# Patient Record
Sex: Male | Born: 1945 | Race: Black or African American | Hispanic: No | Smoking: Current every day smoker
Health system: Southern US, Community
[De-identification: ages and names within clinical notes are randomized; demographics above are authoritative.]

## PROBLEM LIST (undated history)

## (undated) DIAGNOSIS — F1210 Cannabis abuse, uncomplicated: Secondary | ICD-10-CM

## (undated) DIAGNOSIS — I10 Essential (primary) hypertension: Secondary | ICD-10-CM

## (undated) DIAGNOSIS — Z720 Tobacco use: Secondary | ICD-10-CM

## (undated) HISTORY — PX: OTHER SURGICAL HISTORY: SHX169

---

## 1998-02-01 ENCOUNTER — Emergency Department (HOSPITAL_COMMUNITY): Admission: EM | Admit: 1998-02-01 | Discharge: 1998-02-01 | Payer: Self-pay | Admitting: Emergency Medicine

## 1998-02-03 ENCOUNTER — Ambulatory Visit (HOSPITAL_BASED_OUTPATIENT_CLINIC_OR_DEPARTMENT_OTHER): Admission: RE | Admit: 1998-02-03 | Discharge: 1998-02-03 | Payer: Self-pay | Admitting: *Deleted

## 2007-12-30 ENCOUNTER — Encounter: Admission: RE | Admit: 2007-12-30 | Discharge: 2007-12-30 | Payer: Self-pay | Admitting: Cardiology

## 2008-07-09 DIAGNOSIS — A159 Respiratory tuberculosis unspecified: Secondary | ICD-10-CM

## 2008-07-09 HISTORY — DX: Respiratory tuberculosis unspecified: A15.9

## 2008-09-17 ENCOUNTER — Inpatient Hospital Stay (HOSPITAL_COMMUNITY): Admission: EM | Admit: 2008-09-17 | Discharge: 2008-09-23 | Payer: Self-pay | Admitting: Emergency Medicine

## 2008-09-17 ENCOUNTER — Ambulatory Visit: Payer: Self-pay | Admitting: Internal Medicine

## 2008-11-25 ENCOUNTER — Encounter: Admission: RE | Admit: 2008-11-25 | Discharge: 2008-11-25 | Payer: Self-pay | Admitting: Pulmonary Disease

## 2009-03-29 ENCOUNTER — Encounter: Admission: RE | Admit: 2009-03-29 | Discharge: 2009-03-29 | Payer: Self-pay | Admitting: Infectious Diseases

## 2009-08-15 ENCOUNTER — Ambulatory Visit (HOSPITAL_BASED_OUTPATIENT_CLINIC_OR_DEPARTMENT_OTHER): Admission: RE | Admit: 2009-08-15 | Discharge: 2009-08-15 | Payer: Self-pay | Admitting: Internal Medicine

## 2009-09-04 ENCOUNTER — Ambulatory Visit: Payer: Self-pay | Admitting: Internal Medicine

## 2010-10-19 LAB — DIFFERENTIAL
Basophils Absolute: 0 10*3/uL (ref 0.0–0.1)
Basophils Relative: 0 % (ref 0–1)
Eosinophils Absolute: 0 10*3/uL (ref 0.0–0.7)
Lymphs Abs: 0.6 10*3/uL — ABNORMAL LOW (ref 0.7–4.0)
Monocytes Absolute: 0.9 10*3/uL (ref 0.1–1.0)
Monocytes Relative: 9 % (ref 3–12)
Neutro Abs: 4.6 10*3/uL (ref 1.7–7.7)
Neutro Abs: 4.1 10*3/uL (ref 1.7–7.7)
Neutrophils Relative %: 79 % — ABNORMAL HIGH (ref 43–77)

## 2010-10-19 LAB — URINALYSIS, ROUTINE W REFLEX MICROSCOPIC
Glucose, UA: NEGATIVE mg/dL
Leukocytes, UA: NEGATIVE
pH: 6 (ref 5.0–8.0)

## 2010-10-19 LAB — COMPREHENSIVE METABOLIC PANEL
ALT: 20 U/L (ref 0–53)
BUN: 5 mg/dL — ABNORMAL LOW (ref 6–23)
CO2: 24 mEq/L (ref 19–32)
Calcium: 8.4 mg/dL (ref 8.4–10.5)
GFR calc non Af Amer: >60 mL/min (ref >60)
Glucose, Bld: 99 mg/dL (ref 70–99)
Sodium: 133 mEq/L — ABNORMAL LOW (ref 135–145)

## 2010-10-19 LAB — CBC
HCT: 29.9 % — ABNORMAL LOW (ref 39.0–52.0)
Hemoglobin: 10.6 g/dL — ABNORMAL LOW (ref 13.0–17.0)
Hemoglobin: 11.1 g/dL — ABNORMAL LOW (ref 13.0–17.0)
Hemoglobin: 14.7 g/dL (ref 13.0–17.0)
MCHC: 34 g/dL (ref 30.0–36.0)
MCV: 98.6 fL (ref 78.0–100.0)
Platelets: 239 10*3/uL (ref 150–400)
RBC: 3.04 MIL/uL — ABNORMAL LOW (ref 4.22–5.81)
RBC: 3.45 MIL/uL — ABNORMAL LOW (ref 4.22–5.81)
RBC: 3.11 MIL/uL — ABNORMAL LOW (ref 4.22–5.81)
RBC: 3.26 MIL/uL — ABNORMAL LOW (ref 4.22–5.81)
RDW: 12.6 % (ref 11.5–15.5)
WBC: 5.8 10*3/uL (ref 4.0–10.5)
WBC: 6.9 10*3/uL (ref 4.0–10.5)
WBC: 5.6 10*3/uL (ref 4.0–10.5)

## 2010-10-19 LAB — BASIC METABOLIC PANEL
BUN: 5 mg/dL — ABNORMAL LOW (ref 6–23)
CO2: 25 mEq/L (ref 19–32)
Calcium: 8.7 mg/dL (ref 8.4–10.5)
Calcium: 8.4 mg/dL (ref 8.4–10.5)
Calcium: 9 mg/dL (ref 8.4–10.5)
Chloride: 106 mEq/L (ref 96–112)
Creatinine, Ser: 0.6 mg/dL (ref 0.4–1.5)
Creatinine, Ser: 0.77 mg/dL (ref 0.4–1.5)
GFR calc Af Amer: >60 mL/min (ref >60)
GFR calc Af Amer: >60 mL/min (ref >60)
GFR calc Af Amer: >60 mL/min (ref >60)
GFR calc non Af Amer: >60 mL/min (ref >60)
GFR calc non Af Amer: >60 mL/min (ref >60)
Glucose, Bld: 141 mg/dL — ABNORMAL HIGH (ref 70–99)
Potassium: 4 mEq/L (ref 3.5–5.1)
Potassium: 4.1 mEq/L (ref 3.5–5.1)
Sodium: 135 mEq/L (ref 135–145)
Sodium: 132 mEq/L — ABNORMAL LOW (ref 135–145)
Sodium: 133 mEq/L — ABNORMAL LOW (ref 135–145)

## 2010-10-19 LAB — HEPATITIS PANEL, ACUTE
HCV Ab: REACTIVE — AB
Hep A IgM: NEGATIVE
Hep B C IgM: NEGATIVE

## 2010-10-19 LAB — GRAM STAIN

## 2010-10-19 LAB — VANCOMYCIN, TROUGH: Vancomycin Tr: <5 ug/mL — ABNORMAL LOW (ref 10.0–20.0)

## 2010-10-19 LAB — CULTURE, BLOOD (ROUTINE X 2): Culture: NO GROWTH

## 2010-10-19 LAB — AFB CULTURE WITH SMEAR (NOT AT ARMC)

## 2010-10-19 LAB — EXPECTORATED SPUTUM ASSESSMENT W GRAM STAIN, RFLX TO RESP C

## 2010-10-19 LAB — URINE MICROSCOPIC-ADD ON

## 2010-10-19 LAB — HEPATIC FUNCTION PANEL
AST: 51 U/L — ABNORMAL HIGH (ref 0–37)
Bilirubin, Direct: 0.5 mg/dL — ABNORMAL HIGH (ref 0.0–0.3)
Indirect Bilirubin: 0.8 mg/dL (ref 0.3–0.9)

## 2010-10-19 LAB — CULTURE, RESPIRATORY W GRAM STAIN: Culture: NORMAL

## 2010-10-19 LAB — APTT: aPTT: 30 seconds (ref 24–37)

## 2010-10-19 LAB — IRON AND TIBC
Saturation Ratios: 8 % — ABNORMAL LOW (ref 20–55)
TIBC: 157 ug/dL — ABNORMAL LOW (ref 215–435)
UIBC: 145 ug/dL

## 2010-10-19 LAB — PROTIME-INR
INR: 1 (ref 0.00–1.49)
Prothrombin Time: 13.3 seconds (ref 11.6–15.2)

## 2010-10-19 LAB — FERRITIN: Ferritin: 1378 ng/mL — ABNORMAL HIGH (ref 22–322)

## 2010-10-19 LAB — HCV RNA QUANT: HCV Quantitative: 16600000 IU/mL — ABNORMAL HIGH (ref <43)

## 2010-10-19 LAB — RETICULOCYTES: Retic Count, Absolute: 38.4 10*3/uL (ref 19.0–186.0)

## 2010-10-19 LAB — FOLATE: Folate: >20 ng/mL

## 2010-10-19 LAB — VITAMIN B12: Vitamin B-12: 1026 pg/mL — ABNORMAL HIGH (ref 211–911)

## 2010-11-24 NOTE — Discharge Summary (Signed)
NAME:  FRANCOIS, ELK NO.:  [id number]   MEDICAL RECORD NO.:  [id number]          PATIENT TYPE:  INP   LOCATION:  5509                         FACILITY:  [facility]   PHYSICIAN:  Alvester Morin, M.D.  DATE OF BIRTH:  06-25-1946   DATE OF ADMISSION:  09/17/2008  DATE OF DISCHARGE:  09/23/2008                               DISCHARGE SUMMARY   DISCHARGE DIAGNOSES:  1. Tuberculosis.  2. Community-acquired pneumonia.  3. Polysubstance abuse.  4. Protein-calorie malnutrition.  5. Hepatitis C.  6. Left foot fracture with hardware.   DISCHARGE MEDICATIONS:  1. Isoniazid 300 mg p.o. daily.  2. Pyrazinamide 1500 g p.o. daily.  3. Rifampin 600 mg p.o. daily.  4. Ethambutol 1200 mg p.o. daily.  5. Regular Ensure 1 can p.o. 3 times a day.  6. Vitamin B6 50 mg daily.  7. Avelox 400 mg p.o. for 5 days.   DISPOSITION AND FOLLOWUP:  1. Tuberculosis.  [facility] Department, [place],      phone number [phone], has been notified.  They will follow the      patient discharge.  Per their request, the uric acid level was      checked and was in the normal range and PPD has been placed.  The      patient should be followed according to [facility] and Health Department      Tuberculosis Disease Management and Protocols including DOT (direct      observation therapy) with four-drug TB regimen x9 months or as      determined appropriate by the [facility] Department.  2. Pneumonia.  The patient will finish antibiotic treatment with      Avelox for 5 days.  3. Hepatitis C.  The patient should be evaluated for possible      hepatitis C therapy versus continued observation.   PROCEDURES PERFORMED:  1. Chest x-ray, multifocal nodular consolidation bilaterally, greater      on the right side.  2. CT of chest, 6 cm x 5 cm cavitary vision in the right upper lobe,      suprahilar region, abutting the mediastinum.  3. Nodular air space disease in the right upper  lobe with dense      consolidation in the bilateral upper lobes consistent with      multifocal pneumonia.  4. Attenuation in liver consistent with vascular phenomenon versus      differential fat content in liver.   CONSULTATIONS:  Health Department and Pulmonary Medicine.   HISTORY AND PHYSICAL:  A 65 year old African American male with history  of incarceration x2 days, homelessness/living in shelter, IV drug abuse  and 50-year history of 1 pack per day smoking, complaints of 3-week  history of 20-pound weight loss, fatigue, malaise, anorexia, loss of  appetite, chills, and productive green cough.  The patient decided to  come to the ED after experiencing 1 night of night sweats soaking  through his sheets and blankets.  The patient denies fever, though the  patient has been taking Tylenol PM every night because he has been  unable to  sleep due to his symptoms.  The patient also notes lack of  appetite for smoking cigarettes since these symptoms started.  The  patient was tested for TB when he stayed in the home shelter in the  1990s and per the patient this test was negative.   PHYSICAL EXAMINATION:  VITAL SIGNS:  Blood pressure 123/73, heart rate  93, respirations 18-22, temperature 97.7, and saturation 98% on room  air.   ADMITTING LABS:  CBC:  WBC is 5.7, hemoglobin 14.7, hematocrit 43.3, and  platelet 309.  MCV 98.6, ANC 4.1.  Sodium 132, potassium 3.7, chloride  99, bicarb 25, BUN 7, creatinine 0.77, glucose 141, calcium 9.0, total  bilirubin 1.3, direct bilirubin 0.5, indirect bilirubin 0.8, alk phos  54, AST/ALT is 51 and 21, protein is 7.8, albumin 2.5, PTT is 30, INR is  1.0.  He was hepatitis C antibody positive.  HCV viral load is   Dictation ended at this point.      Rosanna Randy, MD       Alvester Morin, M.D.     CEM/MEDQ  D:  09/28/2008  T:  09/29/2008  Job:  [id number]

## 2010-11-24 NOTE — Discharge Summary (Signed)
NAME:  Sean Farrell, Sean Farrell NO.:  [id number]   MEDICAL RECORD NO.:  [id number]          PATIENT TYPE:  INP   LOCATION:  5509                         FACILITY:  [facility]   PHYSICIAN:  Alvester Morin, M.D.  DATE OF BIRTH:  [birth year]   DATE OF ADMISSION:  09/17/2008  DATE OF DISCHARGE:  09/23/2008                               DISCHARGE SUMMARY   DISCHARGE DIAGNOSES:  1. Tuberculosis.  2. Community-acquired pneumonia.  3. Polysubstance abuse.  4. Protein-calorie malnutrition.  5. Hepatitis C.  6. Left foot fracture with hardware.   DISCHARGE MEDICATIONS:  1. Isoniazid 300 mg p.o. daily.  2. Pyrazinamide 1500 g p.o. daily.  3. Rifampin 600 mg p.o. daily.  4. Ethambutol 1200 mg p.o. daily.  5. Regular Ensure 1 can p.o. 3 times daily.  6. Vitamin B6 50 mg p.o. daily.  7. Avelox 400 mg p.o. x5 days.   DISPOSITION FOLLOWUP:  1. Tuberculosis.  [facility] Department, [place],      phone number [phone], has been notified.  They were following      the patient at discharge.  Per their request, uric acid level was      checked and is in the normal range.  PPD has been placed.  The      patient should be followed according to [facility] and Health Department      Tuberculosis Disease Management protocols including DOT, direct      observation therapy, with four-drug TB regimen x9 months or as      determined appropriate by the [facility] Department.  2. Pneumonia.  The patient will finish his 5-day course of Avelox      antibiotics.  3. Hepatitis C.  The patient should be evaluated for possible      hepatitis C therapy versus continued observation.  He is not being      treated at this time.   PROCEDURES PERFORMED:  1. Chest x-ray, multifocal nodular consolidation bilaterally, greater      on the right side.  2. CT of the chest, 6 cm x 5 cm cavitary lesion in the right upper      lobe suprahilar region abutting the mediastinum.  Nodular air  space      disease in the right upper lobe with dense consolidation in the      bilateral upper lobes consistent with multifocal pneumonia.  3. Attenuation in liver consistent with vascular phenomenon versus      differential fat-containing liver   CONSULTATIONS:  Health Department and Pulmonary Medicine.   HISTORY AND PHYSICAL:  A 66 year old African American male with history  of incarceration x2 days, homelessness/living in the shelter, IV drug  abuse and 50-year 1 pack per day smoking complaints, 3-week history of  20-pound weight loss, fatigue, malaise, anorexia, loss of appetite,  chills, and productive green cough.  The patient decided to come to the  ED after experiencing 1 night of night sweats soaking through his sheets  and blankets.  The patient denies fever.  The patient has been Tylenol  PM every night because he  has been unable to sleep due to his symptoms.  The patient also lacks appetite for smoking cigarettes since these  symptoms have started.  The patient was tested for TB when he stayed in  the homeless shelter in 1990s.  Per the patient, this test was negative.   PHYSICAL EXAMINATION:  BP 122/73, heart rate 93, respirations 18-22,  temperature 97.7, and saturations 98% on room air.   ADMITTING LABORATORY DATA:  WBC 5.7, hemoglobin and hematocrit 14.7 and  43.3, platelets 309, MCV 98.6, ANC 4.1.  Sodium 132, potassium 3.7,  chloride 99, bicarb 25, BUN 7, creatinine 0.77, glucose 141, calcium  9.0, bili 1.3, direct 0.5, indirect 0.8, alk phos 54, AST 51, ALT 21,  protein 7.8, albumin 2.5, PTT 30, INR 1.0.  HCV antibody positive.  HCV  RNA quantitation is 16,600,000 IU/mL.  HCV RNA QNP is 7.22 log IU/mL.  HIV negative.  AFB 4+.  Blood culture negative.  Urine culture negative.  Urinalysis negative and the blood culture was negative x2.   HOSPITAL COURSE:  1. Tuberculosis.  The patient was initially admitted for pneumonia      versus tuberculosis versus lung  cancer.  The patient was placed on      airborne precautions with negative pressure isolation, pending      results of AFB smear.  The patient was started on four-drug therapy      with INH, rifampin, pyrazinamide, ethambutol, and B6 supplement      when AFB returned 4+.  Pulmonary was consulted on hospital day #2      for bronchoscopy.  The bronchoscopy was deferred given AFB 4+ which      is TB until proven otherwise per Pulmonary's note.  The Health      Department was notified and they followed the patient through the      hospitalization.  The patient was discharged on September 23, 2008,      after being appropriately educated as to the severity of his      illness and the importance of compliance with four-drug therapy for      9 months.  The Health Department was consulted prior to discharge      for treatment and inpatient length of stay guidelines (there is no      specific protocol).  The patient was discharged to the supervision      of the Health Department, [facility], [place], [phone].  He      will follow as per Health Department TB disease management      protocol/direct observation therapy guidelines.  2. Pneumonia.  The patient was placed on broad-spectrum antibiotics,      vancomycin, azithromycin, ceftriaxone to cover typical and atypical      organisms on hospital day #3.  These were discontinued and replaced      by Avelox only.  3. Malnutrition.  The patient was determined to have severe protein-      calorie malnutrition with recommendations for Ensure (regular      shakes) 3 times daily which the patient has tolerated through his      hospitalization.  4. Polysubstance abuse.  Social Work was consulted on the hospital day      #1.  The patient expressed his willingness to discontinue the use      of illicit substances including marijuana, cocaine, tobacco, and      alcohol.  The patient did not display signs or symptoms of alcohol  withdrawal, CIWA score was  3 and it is always less than 7.   DISCHARGE VITAL SIGNS:  On September 23, 2008, at 12:40 p.m., temperature  98.0, pulse 96, respirations 18, blood pressure was 97/66, and O2 sat  97% on 2 L nasal cannula.   DISCHARGE LABORATORY DATA:  White count 5.8, hemoglobin 11.5, hematocrit  34.3, MCV 99.2, MCHC 33.7, RDW 13.0, platelets 264, neutrophils 79%,  lymphocyte 10%, monocyte 9%, eosinophils 1%, basophils 1%, and ANC 4.6.  Chemistries were as follows:  Sodium 138, potassium 3.5, chloride 106,  bicarb 24, glucose 130, BUN 3, creatinine 0.60, and calcium 8.7.      Rosanna Randy, MD  Electronically Signed      Alvester Morin, M.D.  Electronically Signed    CEM/MEDQ  D:  09/28/2008  T:  09/29/2008  Job:  [id number]   cc:   Shirley Friar

## 2011-03-06 ENCOUNTER — Emergency Department (HOSPITAL_COMMUNITY): Payer: No Typology Code available for payment source

## 2011-03-06 ENCOUNTER — Inpatient Hospital Stay (HOSPITAL_COMMUNITY)
Admission: EM | Admit: 2011-03-06 | Discharge: 2011-03-09 | DRG: 065 | Disposition: A | Discharge status: Home / Self Care | Payer: No Typology Code available for payment source | Attending: Internal Medicine | Admitting: Internal Medicine

## 2011-03-06 DIAGNOSIS — Z8611 Personal history of tuberculosis: Secondary | ICD-10-CM

## 2011-03-06 DIAGNOSIS — B1920 Unspecified viral hepatitis C without hepatic coma: Secondary | ICD-10-CM | POA: Diagnosis present

## 2011-03-06 DIAGNOSIS — G8929 Other chronic pain: Secondary | ICD-10-CM | POA: Diagnosis present

## 2011-03-06 DIAGNOSIS — F1410 Cocaine abuse, uncomplicated: Secondary | ICD-10-CM | POA: Diagnosis present

## 2011-03-06 DIAGNOSIS — I10 Essential (primary) hypertension: Secondary | ICD-10-CM | POA: Diagnosis present

## 2011-03-06 DIAGNOSIS — F1110 Opioid abuse, uncomplicated: Secondary | ICD-10-CM | POA: Diagnosis present

## 2011-03-06 DIAGNOSIS — Z79899 Other long term (current) drug therapy: Secondary | ICD-10-CM

## 2011-03-06 DIAGNOSIS — M47817 Spondylosis without myelopathy or radiculopathy, lumbosacral region: Secondary | ICD-10-CM | POA: Diagnosis present

## 2011-03-06 DIAGNOSIS — F1210 Cannabis abuse, uncomplicated: Secondary | ICD-10-CM | POA: Diagnosis present

## 2011-03-06 DIAGNOSIS — N390 Urinary tract infection, site not specified: Secondary | ICD-10-CM | POA: Diagnosis present

## 2011-03-06 DIAGNOSIS — I498 Other specified cardiac arrhythmias: Secondary | ICD-10-CM | POA: Diagnosis not present

## 2011-03-06 DIAGNOSIS — I6350 Cerebral infarction due to unspecified occlusion or stenosis of unspecified cerebral artery: Principal | ICD-10-CM | POA: Diagnosis present

## 2011-03-06 DIAGNOSIS — F17200 Nicotine dependence, unspecified, uncomplicated: Secondary | ICD-10-CM | POA: Diagnosis present

## 2011-03-06 DIAGNOSIS — Z7982 Long term (current) use of aspirin: Secondary | ICD-10-CM

## 2011-03-06 LAB — CBC
HCT: 45.3 % (ref 39.0–52.0)
MCV: 89.9 fL (ref 78.0–100.0)
RDW: 12.1 % (ref 11.5–15.5)
WBC: 6.1 10*3/uL (ref 4.0–10.5)

## 2011-03-06 LAB — RAPID URINE DRUG SCREEN, HOSP PERFORMED
Amphetamines: NOT DETECTED
Barbiturates: NOT DETECTED
Benzodiazepines: POSITIVE — AB
Cocaine: POSITIVE — AB

## 2011-03-06 LAB — DIFFERENTIAL
Eosinophils Relative: 1 % (ref 0–5)
Lymphocytes Relative: 37 % (ref 12–46)
Lymphs Abs: 2.3 10*3/uL (ref 0.7–4.0)
Monocytes Absolute: 0.8 10*3/uL (ref 0.1–1.0)

## 2011-03-06 LAB — BASIC METABOLIC PANEL
BUN: 12 mg/dL (ref 6–23)
Creatinine, Ser: 1.05 mg/dL (ref 0.50–1.35)
GFR calc Af Amer: >60 mL/min (ref >60)
GFR calc non Af Amer: >60 mL/min (ref >60)
Glucose, Bld: 110 mg/dL — ABNORMAL HIGH (ref 70–99)

## 2011-03-06 LAB — POCT I-STAT TROPONIN I: Troponin i, poc: 0 ng/mL (ref 0.00–0.08)

## 2011-03-06 LAB — CK TOTAL AND CKMB (NOT AT ARMC)
CK, MB: 1.5 ng/mL (ref 0.3–4.0)
Relative Index: INVALID (ref 0.0–2.5)
Total CK: 34 U/L (ref 7–232)

## 2011-03-06 LAB — PROTIME-INR
INR: 0.91 (ref 0.00–1.49)
Prothrombin Time: 12.4 seconds (ref 11.6–15.2)

## 2011-03-06 LAB — APTT: aPTT: 30 seconds (ref 24–37)

## 2011-03-07 DIAGNOSIS — I517 Cardiomegaly: Secondary | ICD-10-CM

## 2011-03-07 LAB — CARDIAC PANEL(CRET KIN+CKTOT+MB+TROPI)
CK, MB: 1.5 ng/mL (ref 0.3–4.0)
CK, MB: 1.6 ng/mL (ref 0.3–4.0)
Relative Index: INVALID (ref 0.0–2.5)
Troponin I: <0.3 ng/mL (ref <0.30)
Troponin I: <0.3 ng/mL (ref <0.30)

## 2011-03-07 LAB — CBC
Hemoglobin: 13.2 g/dL (ref 13.0–17.0)
MCH: 30.9 pg (ref 26.0–34.0)
MCHC: 34.6 g/dL (ref 30.0–36.0)
MCV: 89.5 fL (ref 78.0–100.0)
RBC: 4.27 MIL/uL (ref 4.22–5.81)

## 2011-03-07 LAB — GLUCOSE, CAPILLARY
Glucose-Capillary: 85 mg/dL (ref 70–99)
Glucose-Capillary: 113 mg/dL — ABNORMAL HIGH (ref 70–99)

## 2011-03-07 LAB — COMPREHENSIVE METABOLIC PANEL
Alkaline Phosphatase: 81 U/L (ref 39–117)
BUN: 11 mg/dL (ref 6–23)
Chloride: 104 mEq/L (ref 96–112)
Creatinine, Ser: 0.76 mg/dL (ref 0.50–1.35)
GFR calc Af Amer: >60 mL/min (ref >60)
GFR calc non Af Amer: >60 mL/min (ref >60)
Glucose, Bld: 95 mg/dL (ref 70–99)
Potassium: 3.6 mEq/L (ref 3.5–5.1)
Total Bilirubin: 0.7 mg/dL (ref 0.3–1.2)

## 2011-03-07 LAB — URINALYSIS, MICROSCOPIC ONLY
Bilirubin Urine: NEGATIVE
Ketones, ur: NEGATIVE mg/dL
Nitrite: POSITIVE — AB
Protein, ur: NEGATIVE mg/dL
pH: 6 (ref 5.0–8.0)

## 2011-03-07 LAB — LIPID PANEL
Cholesterol: 105 mg/dL (ref 0–200)
Triglycerides: 79 mg/dL (ref <150)
VLDL: 16 mg/dL (ref 0–40)

## 2011-03-08 LAB — GLUCOSE, CAPILLARY: Glucose-Capillary: 128 mg/dL — ABNORMAL HIGH (ref 70–99)

## 2011-03-08 LAB — BASIC METABOLIC PANEL
BUN: 8 mg/dL (ref 6–23)
CO2: 27 mEq/L (ref 19–32)
Chloride: 103 mEq/L (ref 96–112)
Creatinine, Ser: 0.64 mg/dL (ref 0.50–1.35)
GFR calc Af Amer: >60 mL/min (ref >60)

## 2011-03-09 LAB — GLUCOSE, CAPILLARY: Glucose-Capillary: 105 mg/dL — ABNORMAL HIGH (ref 70–99)

## 2011-03-24 NOTE — H&P (Signed)
NAME:  Sean Farrell, Sean Farrell NO.:  [id number]  MEDICAL RECORD NO.:  [id number]  LOCATION:  [facility]                         FACILITY:  [facility]  PHYSICIAN:  Kela Millin, M.D.DATE OF BIRTH:  [birth year]  DATE OF ADMISSION:  03/06/2011 DATE OF DISCHARGE:                             HISTORY & PHYSICAL   PRIMARY. CARE PHYSICIAN:  Fleet Contras, MD.  CHIEF COMPLAINT:  Presyncope, status post fall.  HISTORY OF PRESENT ILLNESS:  The patient is a 65 year old black male with history of polysubstance abuse, protein-calorie malnutrition, hepatitis C, chronic low back pain and tuberculosis -- status post treatment in 2010 who presents with the above complaints.  He states that he was in his usual state of health until yesterday when he felt lightheaded and then fell down.  He states that he did not loose consciousness because he remembers hearing all the noises as he was falling down and really does not think that he was unconscious at any time.  He denies chest pain, focal weakness, slurred speech, blurry vision, diarrhea, melena, nausea, vomiting and no hematochezia.  He also denies dysphagia, dysuria, cough and no shortness of breath.  He was seen at his primary care physician's office and an EKG was done and noted to be abnormal per PCP with premature atrial contractions and question of ST changes in the lateral leads and he was sent to the ED for further evaluation.  Patient states that he has had similar episodes of presyncope in the past, but was never worked up.  He was seen in the ED and an EKG was done that showed sinus tachycardia with premature atrial complexes, but no acute ST changes, a CT scan of his brain was done and showed left basilar ganglia lacunar infarct noted to have developed since his previous CT scan exam, he is admitted for further evaluation and management.  PAST MEDICAL HISTORY: 1. As above. 2. History of left foot fracture with  hardware.  MEDICATIONS:  Ibuprofen 800 mg t.i.d. p.r.n., Flexeril 10 mg p.o. daily p.r.n.  ALLERGIES:  NKDA.  SOCIAL HISTORY:  Positive for cocaine, initially just indicated that he was around people that used it on Sunday and with further questioning admits to using cocaine this past Sunday.  He states that his last marijuana use was a couple of weeks ago.  He admits to tobacco use, denies alcohol.  FAMILY HISTORY:  He states that his uncles had colon and prostate cancer, denies any family history of strokes and MIs.  REVIEW OF SYSTEMS:  As per HPI, other review of systems negative.  PHYSICAL EXAMINATION:  GENERAL:  The patient is an older black male, he is alert and oriented, in no apparent distress. VITAL SIGNS:  His blood pressure is 136/79, initially 155/131, his pulse is 57 initially 110, and his O2 sat 97%. HEENT:  PERRL, EOMI.  No facial asymmetry, slightly dry mucous membranes. NECK:  Supple, no adenopathy, no thyromegaly, no carotid bruits appreciated. LUNGS:  Clear to auscultation bilaterally.  No crackles or wheezes. CARDIOVASCULAR:  Regular rate and rhythm.  Normal S1 and S2. ABDOMEN:  Soft, bowel sounds present, nontender, nondistended.  No organomegaly and no masses palpable. EXTREMITIES:  No cyanosis and no edema. NEURO:  Alert and oriented x3.  Cranial nerves II through XII are grossly intact.  His strength is 5/5 and symmetric, nonfocal exam.  LABORATORY DATA:  CT scan of brain -- left basilar ganglia lacunar infarct developed since his previous exam, but no acute findings.  His white cell count is 6.1 with a hemoglobin of 16.3, hematocrit of 45.3, platelet count of 195, neutrophil count 48%, alcohol level is less than 11.  Sodium is 138, potassium of 4.3, chloride 101, CO2 of 27, glucose is 110, BUN is 12, creatinine is 1.05, calcium is 10.1.  His point-of-care markers negative x2 and his urine drug screen is positive for cocaine, opiates and marijuana.   His chest x-ray shows chronic lung changes/fibrosis, no acute pulmonary findings.  ASSESSMENT AND PLAN: 1. Presyncope -- status post fall -- as discussed above, the patient     denies losing any consciousness that his CT scan shows basal     ganglia lacunar infarct since his previous exam, although no acute     findings reported.  His EKG here shows sinus tachycardia with     biatrial enlargement and premature atrial complexes but no acute ST     wave changes.  Obtain an MRI and workup for stroke to further     evaluate the CT findings.  We will place patient on aspirin, follow     MRI and consult neurologist, pending the MRI report.  We will also     cycle co-cardiac enzymes, obtain a 2-D echocardiogram and place the     patient on p.r.n. nitroglycerin follow and consider Cardiology     consultation, pending is cardiac enzymes/echo findings. 2. Polysubstance abuse -- patient counseled to quit.  Social work     consult for community resources to quit. 3. History of chronic low back pain -- follow and manage pain as     appropriate. 4. History of hepatitis C -- obtain a.c. meds and follow. 5. History of tuberculosis -- status post treatment in 2010.     Kela Millin, M.D.     ACV/MEDQ  D:  03/06/2011  T:  03/06/2011  Job:  [id number]  cc:   Fleet Contras, M.D.  Electronically Signed by Donnalee Curry M.D. on 03/24/2011 05:42:58 PM

## 2011-04-19 NOTE — Discharge Summary (Signed)
  NAME:  ROWE, WARMAN NO.:  [id number]  MEDICAL RECORD NO.:  [id number]  LOCATION:  3742                         FACILITY:  [facility]  PHYSICIAN:  Zannie Cove, MD     DATE OF BIRTH:  [birth year]  DATE OF ADMISSION:  03/06/2011 DATE OF DISCHARGE:                              DISCHARGE SUMMARY   ADDENDUM  PRIMARY CARE PHYSICIAN:  Fleet Contras, MD at [facility].  Discharge diagnosis as dictated yesterday in addition:  The patient had an episode of SVT yesterday morning.  Subsequently, he was started on 25 mg of atenolol.  He was symptomatic with palpitations at that time.  He was monitored on telemetry for over 24 hours.  He did not have any further events.  Also, had an unremarkable echocardiogram as dictated previously, hence the patient is being discharged home today with atenolol added to his medicine list and SVT to his problem list, to follow up with Dr. Concepcion Elk in 1 week.     Zannie Cove, MD   PJ/MEDQ  D:  03/09/2011  T:  03/09/2011  Job:  [id number]  cc:   Fleet Contras, M.D.  Electronically Signed by Zannie Cove  on 04/19/2011 02:10:58 PM

## 2011-04-19 NOTE — Discharge Summary (Signed)
NAME:  Sean Farrell, Sean Farrell NO.:  [id number]  MEDICAL RECORD NO.:  [id number]  LOCATION:  3742                         FACILITY:  [facility]  PHYSICIAN:  Zannie Cove, MD     DATE OF BIRTH:  December 29, 1945  DATE OF ADMISSION:  03/06/2011 DATE OF DISCHARGE:                              DISCHARGE SUMMARY   PRIMARY CARE PHYSICIAN:  Fleet Contras, MD at [facility].  DISCHARGE DIAGNOSES: 1. Right medial temporal infarct/cerebrovascular accident. 2. Hypertension. 3. Urinary tract infection. 4. Cocaine abuse. 5. Tobacco use. 6. Other polysubstances, i.e., cannabis and narcotic abuse as well. 7. History of chronic head pain. 8. Chronic low back pain. 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  DISCHARGE MEDICATIONS:  As follows: 1. Amlodipine 10 mg daily. 2. Aspirin 325 mg daily. 3. Ciprofloxacin 250 mg p.o. b.i.d. for 5 days. 4. Hydrochlorothiazide 25 mg daily. 5. Flexeril 10 mg p.o. daily as needed for pain.  DIAGNOSTICS/INVESTIGATIONS: 1. X-ray lumbar spine, moderate degenerative lumbar spondylosis, no     acute bony abnormalities. 2. Chest x-ray, chronic lung changes/fibrosis, no acute pulmonary     findings. 3. CT of the head shows atrophy, nonspecific white matter changes,     left basal ganglia lacunar infarct. 4. MRI of the brain showed acute non-hemorrhagic medial right temporal     infarct. 5. MRA of the head showed intracranial atherosclerotic changes without     high-grade stenosis.  HOSPITAL COURSE: 1. [name] is a 65 year old gentleman with history of known     polysubstance abuse who presented to the hospital with presyncope.     Please see the note dictated by Dr. Suanne Marker for more details.  On     admission, he was ordered to have an MRI and MRA of brain and had     cardiac enzymes and echo, etc., ordered.  His MRI came back with a     small right medial temporal infarct, which is felt to be a small     vessel cortical  infarct.  The patient was started on full-strength     aspirin.  His echo was found to be unremarkable.  Did not have any     physical, occupational, or speech therapy needs.  The patient is     also advised to stop cocaine and smoking. 2. Urinary tract infection.  This was sent out yesterday due to     urinary frequency.  UA suggestive of UTI.  However, cultures are     pending at this time.  The patient is being discharged home on     ciprofloxacin for 5 days and is advised to follow up with Urology     since this is a 65 year old male with UTI. 3. Polysubstance abuse.  He was counseled regarding this.  He also was     discussed with social work on a number of occasions.  DISCHARGE CONDITION:  Stable.  VITAL SIGNS:  Temperature 98, pulse 75, blood pressure 170/94, respirations 14, saturating 98% on room air.  DISCHARGE FOLLOWUP: 1. Dr. Fleet Contras in 1 week. 2. Urology in 1 month.  The patient needs a referral through his PCP's office  for this.     Zannie Cove, MD     PJ/MEDQ  D:  03/08/2011  T:  03/08/2011  Job:  [id number]  cc:   Fleet Contras, M.D.  Electronically Signed by Zannie Cove  on 04/19/2011 02:10:52 PM

## 2014-02-23 ENCOUNTER — Other Ambulatory Visit (HOSPITAL_COMMUNITY): Payer: Self-pay | Admitting: Internal Medicine

## 2014-02-23 DIAGNOSIS — I739 Peripheral vascular disease, unspecified: Secondary | ICD-10-CM

## 2014-02-23 DIAGNOSIS — I110 Hypertensive heart disease with heart failure: Secondary | ICD-10-CM

## 2014-02-23 DIAGNOSIS — I499 Cardiac arrhythmia, unspecified: Secondary | ICD-10-CM

## 2014-02-25 ENCOUNTER — Ambulatory Visit (HOSPITAL_COMMUNITY): Payer: PRIVATE HEALTH INSURANCE | Attending: Internal Medicine

## 2014-02-25 ENCOUNTER — Ambulatory Visit (HOSPITAL_COMMUNITY): Admission: RE | Admit: 2014-02-25 | Payer: PRIVATE HEALTH INSURANCE | Source: Ambulatory Visit

## 2014-07-16 DIAGNOSIS — G4700 Insomnia, unspecified: Secondary | ICD-10-CM | POA: Diagnosis not present

## 2014-07-16 DIAGNOSIS — I10 Essential (primary) hypertension: Secondary | ICD-10-CM | POA: Diagnosis not present

## 2014-07-16 DIAGNOSIS — K219 Gastro-esophageal reflux disease without esophagitis: Secondary | ICD-10-CM | POA: Diagnosis not present

## 2014-07-16 DIAGNOSIS — M5136 Other intervertebral disc degeneration, lumbar region: Secondary | ICD-10-CM | POA: Diagnosis not present

## 2014-07-16 DIAGNOSIS — J449 Chronic obstructive pulmonary disease, unspecified: Secondary | ICD-10-CM | POA: Diagnosis not present

## 2014-08-28 DIAGNOSIS — M150 Primary generalized (osteo)arthritis: Secondary | ICD-10-CM | POA: Diagnosis not present

## 2014-08-29 DIAGNOSIS — M150 Primary generalized (osteo)arthritis: Secondary | ICD-10-CM | POA: Diagnosis not present

## 2014-08-30 DIAGNOSIS — M150 Primary generalized (osteo)arthritis: Secondary | ICD-10-CM | POA: Diagnosis not present

## 2014-08-31 DIAGNOSIS — M150 Primary generalized (osteo)arthritis: Secondary | ICD-10-CM | POA: Diagnosis not present

## 2014-09-01 DIAGNOSIS — M150 Primary generalized (osteo)arthritis: Secondary | ICD-10-CM | POA: Diagnosis not present

## 2014-09-02 DIAGNOSIS — M150 Primary generalized (osteo)arthritis: Secondary | ICD-10-CM | POA: Diagnosis not present

## 2014-09-03 DIAGNOSIS — M150 Primary generalized (osteo)arthritis: Secondary | ICD-10-CM | POA: Diagnosis not present

## 2014-09-04 DIAGNOSIS — M150 Primary generalized (osteo)arthritis: Secondary | ICD-10-CM | POA: Diagnosis not present

## 2014-09-05 DIAGNOSIS — M150 Primary generalized (osteo)arthritis: Secondary | ICD-10-CM | POA: Diagnosis not present

## 2014-09-07 DIAGNOSIS — M150 Primary generalized (osteo)arthritis: Secondary | ICD-10-CM | POA: Diagnosis not present

## 2014-09-08 DIAGNOSIS — M150 Primary generalized (osteo)arthritis: Secondary | ICD-10-CM | POA: Diagnosis not present

## 2014-09-09 DIAGNOSIS — M150 Primary generalized (osteo)arthritis: Secondary | ICD-10-CM | POA: Diagnosis not present

## 2014-09-10 DIAGNOSIS — M150 Primary generalized (osteo)arthritis: Secondary | ICD-10-CM | POA: Diagnosis not present

## 2014-09-11 DIAGNOSIS — M150 Primary generalized (osteo)arthritis: Secondary | ICD-10-CM | POA: Diagnosis not present

## 2014-09-12 DIAGNOSIS — M150 Primary generalized (osteo)arthritis: Secondary | ICD-10-CM | POA: Diagnosis not present

## 2014-09-13 DIAGNOSIS — M150 Primary generalized (osteo)arthritis: Secondary | ICD-10-CM | POA: Diagnosis not present

## 2014-09-14 DIAGNOSIS — M150 Primary generalized (osteo)arthritis: Secondary | ICD-10-CM | POA: Diagnosis not present

## 2014-09-15 DIAGNOSIS — M150 Primary generalized (osteo)arthritis: Secondary | ICD-10-CM | POA: Diagnosis not present

## 2014-09-16 DIAGNOSIS — M150 Primary generalized (osteo)arthritis: Secondary | ICD-10-CM | POA: Diagnosis not present

## 2014-09-17 DIAGNOSIS — M150 Primary generalized (osteo)arthritis: Secondary | ICD-10-CM | POA: Diagnosis not present

## 2014-09-18 DIAGNOSIS — M150 Primary generalized (osteo)arthritis: Secondary | ICD-10-CM | POA: Diagnosis not present

## 2014-09-19 DIAGNOSIS — M150 Primary generalized (osteo)arthritis: Secondary | ICD-10-CM | POA: Diagnosis not present

## 2014-09-20 DIAGNOSIS — M150 Primary generalized (osteo)arthritis: Secondary | ICD-10-CM | POA: Diagnosis not present

## 2014-09-22 DIAGNOSIS — M150 Primary generalized (osteo)arthritis: Secondary | ICD-10-CM | POA: Diagnosis not present

## 2014-09-23 DIAGNOSIS — M150 Primary generalized (osteo)arthritis: Secondary | ICD-10-CM | POA: Diagnosis not present

## 2014-09-24 DIAGNOSIS — M150 Primary generalized (osteo)arthritis: Secondary | ICD-10-CM | POA: Diagnosis not present

## 2014-09-25 DIAGNOSIS — M150 Primary generalized (osteo)arthritis: Secondary | ICD-10-CM | POA: Diagnosis not present

## 2014-09-26 DIAGNOSIS — M150 Primary generalized (osteo)arthritis: Secondary | ICD-10-CM | POA: Diagnosis not present

## 2014-09-27 DIAGNOSIS — M150 Primary generalized (osteo)arthritis: Secondary | ICD-10-CM | POA: Diagnosis not present

## 2014-09-28 DIAGNOSIS — M150 Primary generalized (osteo)arthritis: Secondary | ICD-10-CM | POA: Diagnosis not present

## 2014-09-29 DIAGNOSIS — M150 Primary generalized (osteo)arthritis: Secondary | ICD-10-CM | POA: Diagnosis not present

## 2014-09-30 DIAGNOSIS — M150 Primary generalized (osteo)arthritis: Secondary | ICD-10-CM | POA: Diagnosis not present

## 2014-10-01 DIAGNOSIS — M150 Primary generalized (osteo)arthritis: Secondary | ICD-10-CM | POA: Diagnosis not present

## 2014-10-02 DIAGNOSIS — M150 Primary generalized (osteo)arthritis: Secondary | ICD-10-CM | POA: Diagnosis not present

## 2014-10-03 DIAGNOSIS — M150 Primary generalized (osteo)arthritis: Secondary | ICD-10-CM | POA: Diagnosis not present

## 2014-10-04 DIAGNOSIS — M150 Primary generalized (osteo)arthritis: Secondary | ICD-10-CM | POA: Diagnosis not present

## 2014-10-05 DIAGNOSIS — M150 Primary generalized (osteo)arthritis: Secondary | ICD-10-CM | POA: Diagnosis not present

## 2014-10-09 DIAGNOSIS — M150 Primary generalized (osteo)arthritis: Secondary | ICD-10-CM | POA: Diagnosis not present

## 2014-10-10 DIAGNOSIS — M150 Primary generalized (osteo)arthritis: Secondary | ICD-10-CM | POA: Diagnosis not present

## 2014-10-11 DIAGNOSIS — M150 Primary generalized (osteo)arthritis: Secondary | ICD-10-CM | POA: Diagnosis not present

## 2014-10-12 DIAGNOSIS — M150 Primary generalized (osteo)arthritis: Secondary | ICD-10-CM | POA: Diagnosis not present

## 2014-10-13 DIAGNOSIS — M150 Primary generalized (osteo)arthritis: Secondary | ICD-10-CM | POA: Diagnosis not present

## 2014-10-14 DIAGNOSIS — M150 Primary generalized (osteo)arthritis: Secondary | ICD-10-CM | POA: Diagnosis not present

## 2014-10-15 DIAGNOSIS — M150 Primary generalized (osteo)arthritis: Secondary | ICD-10-CM | POA: Diagnosis not present

## 2014-10-16 DIAGNOSIS — M150 Primary generalized (osteo)arthritis: Secondary | ICD-10-CM | POA: Diagnosis not present

## 2014-10-17 DIAGNOSIS — M150 Primary generalized (osteo)arthritis: Secondary | ICD-10-CM | POA: Diagnosis not present

## 2014-10-18 DIAGNOSIS — M150 Primary generalized (osteo)arthritis: Secondary | ICD-10-CM | POA: Diagnosis not present

## 2014-10-19 DIAGNOSIS — M150 Primary generalized (osteo)arthritis: Secondary | ICD-10-CM | POA: Diagnosis not present

## 2014-10-20 DIAGNOSIS — M150 Primary generalized (osteo)arthritis: Secondary | ICD-10-CM | POA: Diagnosis not present

## 2014-10-21 DIAGNOSIS — M150 Primary generalized (osteo)arthritis: Secondary | ICD-10-CM | POA: Diagnosis not present

## 2014-10-22 DIAGNOSIS — M150 Primary generalized (osteo)arthritis: Secondary | ICD-10-CM | POA: Diagnosis not present

## 2014-10-23 DIAGNOSIS — M150 Primary generalized (osteo)arthritis: Secondary | ICD-10-CM | POA: Diagnosis not present

## 2014-10-24 DIAGNOSIS — M150 Primary generalized (osteo)arthritis: Secondary | ICD-10-CM | POA: Diagnosis not present

## 2014-10-25 DIAGNOSIS — M150 Primary generalized (osteo)arthritis: Secondary | ICD-10-CM | POA: Diagnosis not present

## 2014-10-26 DIAGNOSIS — M150 Primary generalized (osteo)arthritis: Secondary | ICD-10-CM | POA: Diagnosis not present

## 2014-10-27 DIAGNOSIS — M150 Primary generalized (osteo)arthritis: Secondary | ICD-10-CM | POA: Diagnosis not present

## 2014-10-28 DIAGNOSIS — M150 Primary generalized (osteo)arthritis: Secondary | ICD-10-CM | POA: Diagnosis not present

## 2014-10-29 DIAGNOSIS — M150 Primary generalized (osteo)arthritis: Secondary | ICD-10-CM | POA: Diagnosis not present

## 2014-10-30 DIAGNOSIS — M150 Primary generalized (osteo)arthritis: Secondary | ICD-10-CM | POA: Diagnosis not present

## 2014-10-31 DIAGNOSIS — M150 Primary generalized (osteo)arthritis: Secondary | ICD-10-CM | POA: Diagnosis not present

## 2014-11-01 DIAGNOSIS — M150 Primary generalized (osteo)arthritis: Secondary | ICD-10-CM | POA: Diagnosis not present

## 2014-11-02 DIAGNOSIS — M150 Primary generalized (osteo)arthritis: Secondary | ICD-10-CM | POA: Diagnosis not present

## 2014-11-03 DIAGNOSIS — M150 Primary generalized (osteo)arthritis: Secondary | ICD-10-CM | POA: Diagnosis not present

## 2014-11-04 DIAGNOSIS — M150 Primary generalized (osteo)arthritis: Secondary | ICD-10-CM | POA: Diagnosis not present

## 2014-11-05 DIAGNOSIS — M150 Primary generalized (osteo)arthritis: Secondary | ICD-10-CM | POA: Diagnosis not present

## 2014-11-06 DIAGNOSIS — M150 Primary generalized (osteo)arthritis: Secondary | ICD-10-CM | POA: Diagnosis not present

## 2014-11-07 DIAGNOSIS — M150 Primary generalized (osteo)arthritis: Secondary | ICD-10-CM | POA: Diagnosis not present

## 2014-11-08 DIAGNOSIS — M150 Primary generalized (osteo)arthritis: Secondary | ICD-10-CM | POA: Diagnosis not present

## 2014-11-09 DIAGNOSIS — M150 Primary generalized (osteo)arthritis: Secondary | ICD-10-CM | POA: Diagnosis not present

## 2014-11-10 DIAGNOSIS — M150 Primary generalized (osteo)arthritis: Secondary | ICD-10-CM | POA: Diagnosis not present

## 2014-11-11 DIAGNOSIS — M150 Primary generalized (osteo)arthritis: Secondary | ICD-10-CM | POA: Diagnosis not present

## 2014-11-12 DIAGNOSIS — M150 Primary generalized (osteo)arthritis: Secondary | ICD-10-CM | POA: Diagnosis not present

## 2014-11-13 DIAGNOSIS — M150 Primary generalized (osteo)arthritis: Secondary | ICD-10-CM | POA: Diagnosis not present

## 2014-11-14 DIAGNOSIS — M150 Primary generalized (osteo)arthritis: Secondary | ICD-10-CM | POA: Diagnosis not present

## 2014-11-15 DIAGNOSIS — M150 Primary generalized (osteo)arthritis: Secondary | ICD-10-CM | POA: Diagnosis not present

## 2014-11-16 DIAGNOSIS — M150 Primary generalized (osteo)arthritis: Secondary | ICD-10-CM | POA: Diagnosis not present

## 2014-11-17 DIAGNOSIS — M150 Primary generalized (osteo)arthritis: Secondary | ICD-10-CM | POA: Diagnosis not present

## 2014-11-18 DIAGNOSIS — M150 Primary generalized (osteo)arthritis: Secondary | ICD-10-CM | POA: Diagnosis not present

## 2014-11-19 DIAGNOSIS — M150 Primary generalized (osteo)arthritis: Secondary | ICD-10-CM | POA: Diagnosis not present

## 2014-11-20 DIAGNOSIS — M150 Primary generalized (osteo)arthritis: Secondary | ICD-10-CM | POA: Diagnosis not present

## 2014-11-21 DIAGNOSIS — M150 Primary generalized (osteo)arthritis: Secondary | ICD-10-CM | POA: Diagnosis not present

## 2014-11-22 DIAGNOSIS — M150 Primary generalized (osteo)arthritis: Secondary | ICD-10-CM | POA: Diagnosis not present

## 2014-11-23 DIAGNOSIS — M150 Primary generalized (osteo)arthritis: Secondary | ICD-10-CM | POA: Diagnosis not present

## 2014-11-24 DIAGNOSIS — M150 Primary generalized (osteo)arthritis: Secondary | ICD-10-CM | POA: Diagnosis not present

## 2014-11-25 DIAGNOSIS — M150 Primary generalized (osteo)arthritis: Secondary | ICD-10-CM | POA: Diagnosis not present

## 2014-11-26 DIAGNOSIS — M150 Primary generalized (osteo)arthritis: Secondary | ICD-10-CM | POA: Diagnosis not present

## 2014-11-27 DIAGNOSIS — M150 Primary generalized (osteo)arthritis: Secondary | ICD-10-CM | POA: Diagnosis not present

## 2014-11-28 DIAGNOSIS — M150 Primary generalized (osteo)arthritis: Secondary | ICD-10-CM | POA: Diagnosis not present

## 2014-11-29 DIAGNOSIS — M150 Primary generalized (osteo)arthritis: Secondary | ICD-10-CM | POA: Diagnosis not present

## 2014-11-30 DIAGNOSIS — M150 Primary generalized (osteo)arthritis: Secondary | ICD-10-CM | POA: Diagnosis not present

## 2014-12-01 DIAGNOSIS — M150 Primary generalized (osteo)arthritis: Secondary | ICD-10-CM | POA: Diagnosis not present

## 2014-12-02 DIAGNOSIS — M150 Primary generalized (osteo)arthritis: Secondary | ICD-10-CM | POA: Diagnosis not present

## 2014-12-03 DIAGNOSIS — M150 Primary generalized (osteo)arthritis: Secondary | ICD-10-CM | POA: Diagnosis not present

## 2014-12-11 DIAGNOSIS — M150 Primary generalized (osteo)arthritis: Secondary | ICD-10-CM | POA: Diagnosis not present

## 2014-12-12 DIAGNOSIS — M150 Primary generalized (osteo)arthritis: Secondary | ICD-10-CM | POA: Diagnosis not present

## 2014-12-13 DIAGNOSIS — M150 Primary generalized (osteo)arthritis: Secondary | ICD-10-CM | POA: Diagnosis not present

## 2014-12-14 DIAGNOSIS — M150 Primary generalized (osteo)arthritis: Secondary | ICD-10-CM | POA: Diagnosis not present

## 2014-12-15 DIAGNOSIS — M150 Primary generalized (osteo)arthritis: Secondary | ICD-10-CM | POA: Diagnosis not present

## 2014-12-16 DIAGNOSIS — M150 Primary generalized (osteo)arthritis: Secondary | ICD-10-CM | POA: Diagnosis not present

## 2014-12-17 DIAGNOSIS — M150 Primary generalized (osteo)arthritis: Secondary | ICD-10-CM | POA: Diagnosis not present

## 2014-12-18 DIAGNOSIS — M150 Primary generalized (osteo)arthritis: Secondary | ICD-10-CM | POA: Diagnosis not present

## 2014-12-19 DIAGNOSIS — M150 Primary generalized (osteo)arthritis: Secondary | ICD-10-CM | POA: Diagnosis not present

## 2014-12-20 DIAGNOSIS — M150 Primary generalized (osteo)arthritis: Secondary | ICD-10-CM | POA: Diagnosis not present

## 2014-12-21 DIAGNOSIS — M150 Primary generalized (osteo)arthritis: Secondary | ICD-10-CM | POA: Diagnosis not present

## 2014-12-22 DIAGNOSIS — M150 Primary generalized (osteo)arthritis: Secondary | ICD-10-CM | POA: Diagnosis not present

## 2014-12-23 DIAGNOSIS — M150 Primary generalized (osteo)arthritis: Secondary | ICD-10-CM | POA: Diagnosis not present

## 2014-12-24 DIAGNOSIS — M150 Primary generalized (osteo)arthritis: Secondary | ICD-10-CM | POA: Diagnosis not present

## 2015-01-12 DIAGNOSIS — I10 Essential (primary) hypertension: Secondary | ICD-10-CM | POA: Diagnosis not present

## 2015-01-12 DIAGNOSIS — Z125 Encounter for screening for malignant neoplasm of prostate: Secondary | ICD-10-CM | POA: Diagnosis not present

## 2015-01-12 DIAGNOSIS — F17200 Nicotine dependence, unspecified, uncomplicated: Secondary | ICD-10-CM | POA: Diagnosis not present

## 2015-01-12 DIAGNOSIS — J449 Chronic obstructive pulmonary disease, unspecified: Secondary | ICD-10-CM | POA: Diagnosis not present

## 2015-01-12 DIAGNOSIS — Z13220 Encounter for screening for lipoid disorders: Secondary | ICD-10-CM | POA: Diagnosis not present

## 2015-01-12 DIAGNOSIS — Z131 Encounter for screening for diabetes mellitus: Secondary | ICD-10-CM | POA: Diagnosis not present

## 2016-03-17 ENCOUNTER — Encounter (HOSPITAL_COMMUNITY): Payer: Self-pay | Admitting: Emergency Medicine

## 2016-03-17 ENCOUNTER — Emergency Department (HOSPITAL_COMMUNITY)
Admission: EM | Admit: 2016-03-17 | Discharge: 2016-03-17 | Disposition: A | Discharge status: Home / Self Care | Payer: Medicare Other | Attending: Emergency Medicine | Admitting: Emergency Medicine

## 2016-03-17 DIAGNOSIS — S00211A Abrasion of right eyelid and periocular area, initial encounter: Secondary | ICD-10-CM | POA: Diagnosis not present

## 2016-03-17 DIAGNOSIS — S0990XA Unspecified injury of head, initial encounter: Secondary | ICD-10-CM

## 2016-03-17 DIAGNOSIS — Z23 Encounter for immunization: Secondary | ICD-10-CM | POA: Diagnosis not present

## 2016-03-17 DIAGNOSIS — Y929 Unspecified place or not applicable: Secondary | ICD-10-CM | POA: Insufficient documentation

## 2016-03-17 DIAGNOSIS — S0993XA Unspecified injury of face, initial encounter: Secondary | ICD-10-CM | POA: Diagnosis present

## 2016-03-17 DIAGNOSIS — Y939 Activity, unspecified: Secondary | ICD-10-CM | POA: Diagnosis not present

## 2016-03-17 DIAGNOSIS — W228XXA Striking against or struck by other objects, initial encounter: Secondary | ICD-10-CM | POA: Diagnosis not present

## 2016-03-17 DIAGNOSIS — S80212A Abrasion, left knee, initial encounter: Secondary | ICD-10-CM | POA: Diagnosis not present

## 2016-03-17 DIAGNOSIS — W19XXXA Unspecified fall, initial encounter: Secondary | ICD-10-CM

## 2016-03-17 DIAGNOSIS — Y999 Unspecified external cause status: Secondary | ICD-10-CM | POA: Diagnosis not present

## 2016-03-17 DIAGNOSIS — I10 Essential (primary) hypertension: Secondary | ICD-10-CM | POA: Diagnosis not present

## 2016-03-17 DIAGNOSIS — F17200 Nicotine dependence, unspecified, uncomplicated: Secondary | ICD-10-CM | POA: Insufficient documentation

## 2016-03-17 HISTORY — DX: Essential (primary) hypertension: I10

## 2016-03-17 LAB — CBC WITH DIFFERENTIAL/PLATELET
Basophils Absolute: 0 10*3/uL (ref 0.0–0.1)
Basophils Relative: 0 %
Eosinophils Absolute: 0.1 10*3/uL (ref 0.0–0.7)
Eosinophils Relative: 1 %
HCT: 39.6 % (ref 39.0–52.0)
Hemoglobin: 12.8 g/dL — ABNORMAL LOW (ref 13.0–17.0)
Lymphocytes Relative: 34 %
Lymphs Abs: 1.5 10*3/uL (ref 0.7–4.0)
MCH: 30 pg (ref 26.0–34.0)
MCHC: 32.3 g/dL (ref 30.0–36.0)
MCV: 92.7 fL (ref 78.0–100.0)
Monocytes Absolute: 0.5 10*3/uL (ref 0.1–1.0)
Monocytes Relative: 11 %
Neutro Abs: 2.3 10*3/uL (ref 1.7–7.7)
Neutrophils Relative %: 54 %
Platelets: 181 10*3/uL (ref 150–400)
RBC: 4.27 MIL/uL (ref 4.22–5.81)
RDW: 12.6 % (ref 11.5–15.5)
WBC: 4.4 10*3/uL (ref 4.0–10.5)

## 2016-03-17 LAB — BASIC METABOLIC PANEL
Anion gap: 6 (ref 5–15)
BUN: 10 mg/dL (ref 6–20)
CO2: 24 mmol/L (ref 22–32)
Calcium: 9.6 mg/dL (ref 8.9–10.3)
Chloride: 102 mmol/L (ref 101–111)
Creatinine, Ser: 0.77 mg/dL (ref 0.61–1.24)
GFR calc Af Amer: >60 mL/min (ref >60)
GFR calc non Af Amer: >60 mL/min (ref >60)
Glucose, Bld: 137 mg/dL — ABNORMAL HIGH (ref 65–99)
Potassium: 3.8 mmol/L (ref 3.5–5.1)
Sodium: 132 mmol/L — ABNORMAL LOW (ref 135–145)

## 2016-03-17 LAB — URINALYSIS, ROUTINE W REFLEX MICROSCOPIC
Bilirubin Urine: NEGATIVE
Glucose, UA: NEGATIVE mg/dL
Hgb urine dipstick: NEGATIVE
Ketones, ur: NEGATIVE mg/dL
Nitrite: NEGATIVE
Protein, ur: NEGATIVE mg/dL
Specific Gravity, Urine: 1.016 (ref 1.005–1.030)
pH: 7 (ref 5.0–8.0)

## 2016-03-17 LAB — URINE MICROSCOPIC-ADD ON
Bacteria, UA: NONE SEEN
RBC / HPF: NONE SEEN RBC/hpf (ref 0–5)

## 2016-03-17 MED ORDER — IBUPROFEN 400 MG PO TABS
400.0000 mg | ORAL_TABLET | Freq: Once | ORAL | Status: AC
  Administered 2016-03-17: 400 mg via ORAL
  Filled 2016-03-17: qty 1

## 2016-03-17 MED ORDER — TETANUS-DIPHTH-ACELL PERTUSSIS 5-2.5-18.5 LF-MCG/0.5 IM SUSP
0.5000 mL | Freq: Once | INTRAMUSCULAR | Status: AC
  Administered 2016-03-17: 0.5 mL via INTRAMUSCULAR
  Filled 2016-03-17: qty 0.5

## 2016-03-17 MED ORDER — TRAMADOL HCL 50 MG PO TABS
50.0000 mg | ORAL_TABLET | Freq: Once | ORAL | Status: AC
  Administered 2016-03-17: 50 mg via ORAL
  Filled 2016-03-17: qty 1

## 2016-03-17 NOTE — ED Provider Notes (Signed)
[facility] DEPT Provider Note   CSN: [id number] Arrival date & time: 03/17/16  1054    By signing my name below, I, Sonum Patel, attest that this documentation has been prepared under the direction and in the presence of Virgel Manifold, MD. Electronically Signed: Sonum Patel, Education administrator. 03/17/16. 11:30 AM.  History   Chief Complaint Chief Complaint  Patient presents with  . Head Injury  . Fall  . Head Laceration    The history is provided by the patient. No language interpreter was used.     HPI Comments: Sean Farrell is a 70 y.o. male who presents to the Emergency Department complaining of a fall that occurred late last night. He states he got up from bed to check the heat when he fell and believes had an episode of syncope. He does not recall feeling dizzy or lightheaded prior to the fall; states he did not trip or lose his balance. He complains of a laceration to the right eyebrow and left knee pain since the fall. He reports feeling fine before the incident. He reports similar episodes in the past. He denies current dizziness, lightheadedness, SOB, neck pain, back pain. He is unsure of last tetanus update.    Past Medical History:  Diagnosis Date  . Hypertension     There are no active problems to display for this patient.   History reviewed. No pertinent surgical history.     Home Medications    Prior to Admission medications   Not on File    Family History No family history on file.  Social History Social History  Substance Use Topics  . Smoking status: Current Every Day Smoker  . Smokeless tobacco: Current User  . Alcohol use No     Allergies   Review of patient's allergies indicates not on file.   Review of Systems Review of Systems  Respiratory: Negative for shortness of breath.   Musculoskeletal: Positive for arthralgias. Negative for back pain and neck pain.  Skin: Positive for wound.  Neurological: Negative for dizziness and  light-headedness.  All other systems reviewed and are negative.    Physical Exam Updated Vital Signs BP 124/55   Pulse (!) 36   Temp 98.3 F (36.8 C) (Oral)   SpO2 100%   Physical Exam  Constitutional: He is oriented to person, place, and time. He appears well-developed and well-nourished.  HENT:  Head: Normocephalic.  Deep abrasion to right eyebrow. No active bleeding. No significant bony tenderness.  Eyes: EOM are normal.  Neck: Normal range of motion.  Cardiovascular: Normal rate, regular rhythm, normal heart sounds and intact distal pulses.   Pulmonary/Chest: Effort normal and breath sounds normal. No respiratory distress.  Abdominal: Soft. He exhibits no distension. There is no tenderness.  Musculoskeletal: Normal range of motion.  No midline cervical tenderness. Small abrasion to left anterior knee. No bony tenderness. Can actively range against resistance.   Neurological: He is alert and oriented to person, place, and time. He displays normal reflexes. No cranial nerve deficit. He exhibits normal muscle tone. Coordination normal.  Skin: Skin is warm and dry. Abrasion noted.  Psychiatric: He has a normal mood and affect. Judgment normal.  Nursing note and vitals reviewed.    ED Treatments / Results  DIAGNOSTIC STUDIES: Oxygen Saturation is 100% on RA, normal by my interpretation.    COORDINATION OF CARE: 11:29 AM Discussed treatment plan with pt at bedside and pt agreed to plan.    Labs (all labs ordered are  listed, but only abnormal results are displayed) Labs Reviewed - No data to display  EKG  EKG Interpretation  Date/Time:  Saturday March 17 2016 11:05:04 EDT Ventricular Rate:  72 PR Interval:  242 QRS Duration: 80 QT Interval:  396 QTC Calculation: 433 R Axis:   -77 Text Interpretation:  Sinus bradycardia with 1st degree A-V block with frequent Premature ventricular complexes and Premature atrial complexes Right atrial enlargement Left axis  deviation Pulmonary disease pattern Abnormal ECG Confirmed by Wilson Singer  MD, Martena Emanuele [id number]) on 03/17/2016 11:44:09 AM       Radiology No results found.   Procedures Procedures (including critical care time)  Medications Ordered in ED Medications - No data to display   Initial Impression / Assessment and Plan / ED Course  I have reviewed the triage vital signs and the nursing notes.  Pertinent labs & imaging results that were available during my care of the patient were reviewed by me and considered in my medical decision making (see chart for details).  Clinical Course    70yM with fall. Deep eyebrow abrasion. Not amenable to suturing. Tetanus updated. Local wound care. nonfocal neuro exam. HD stable. It has been determined that no acute conditions requiring further emergency intervention are present at this time. The patient has been advised of the diagnosis and plan. I reviewed any labs and imaging including any potential incidental findings. We have discussed signs and symptoms that warrant return to the ED and they are listed in the discharge instructions.    Final Clinical Impressions(s) / ED Diagnoses   Final diagnoses:  Minor head injury, initial encounter  Fall, initial encounter    New Prescriptions New Prescriptions   No medications on file   I personally preformed the services scribed in my presence. The recorded information has been reviewed is accurate. Virgel Manifold, MD.    Virgel Manifold, MD 03/21/16 [phone]

## 2016-03-17 NOTE — ED Triage Notes (Signed)
Pt. Stated, last night I got up to check the heat and I fell and hit the corner of my eyebrow.  I don't really remember how I fell

## 2017-07-05 ENCOUNTER — Encounter (HOSPITAL_COMMUNITY): Payer: Self-pay | Admitting: Emergency Medicine

## 2017-07-05 ENCOUNTER — Emergency Department (HOSPITAL_COMMUNITY): Payer: Medicare Other

## 2017-07-05 ENCOUNTER — Other Ambulatory Visit: Payer: Self-pay

## 2017-07-05 DIAGNOSIS — Z79899 Other long term (current) drug therapy: Secondary | ICD-10-CM | POA: Insufficient documentation

## 2017-07-05 DIAGNOSIS — B974 Respiratory syncytial virus as the cause of diseases classified elsewhere: Secondary | ICD-10-CM | POA: Diagnosis not present

## 2017-07-05 DIAGNOSIS — E871 Hypo-osmolality and hyponatremia: Secondary | ICD-10-CM | POA: Diagnosis not present

## 2017-07-05 DIAGNOSIS — F17210 Nicotine dependence, cigarettes, uncomplicated: Secondary | ICD-10-CM | POA: Diagnosis not present

## 2017-07-05 DIAGNOSIS — I471 Supraventricular tachycardia: Secondary | ICD-10-CM | POA: Diagnosis not present

## 2017-07-05 DIAGNOSIS — E876 Hypokalemia: Secondary | ICD-10-CM | POA: Insufficient documentation

## 2017-07-05 DIAGNOSIS — I10 Essential (primary) hypertension: Secondary | ICD-10-CM | POA: Insufficient documentation

## 2017-07-05 DIAGNOSIS — J449 Chronic obstructive pulmonary disease, unspecified: Secondary | ICD-10-CM | POA: Diagnosis not present

## 2017-07-05 DIAGNOSIS — R6510 Systemic inflammatory response syndrome (SIRS) of non-infectious origin without acute organ dysfunction: Secondary | ICD-10-CM | POA: Diagnosis not present

## 2017-07-05 DIAGNOSIS — I959 Hypotension, unspecified: Secondary | ICD-10-CM | POA: Diagnosis not present

## 2017-07-05 DIAGNOSIS — J181 Lobar pneumonia, unspecified organism: Principal | ICD-10-CM | POA: Insufficient documentation

## 2017-07-05 DIAGNOSIS — N179 Acute kidney failure, unspecified: Secondary | ICD-10-CM | POA: Insufficient documentation

## 2017-07-05 LAB — CBC WITH DIFFERENTIAL/PLATELET
BASOS PCT: 0 %
Basophils Absolute: 0 10*3/uL (ref 0.0–0.1)
Eosinophils Absolute: 0 10*3/uL (ref 0.0–0.7)
Eosinophils Relative: 0 %
HEMATOCRIT: 35.4 % — AB (ref 39.0–52.0)
HEMOGLOBIN: 12.5 g/dL — AB (ref 13.0–17.0)
LYMPHS ABS: 2.9 10*3/uL (ref 0.7–4.0)
Lymphocytes Relative: 30 %
MCH: 31 pg (ref 26.0–34.0)
MCHC: 35.3 g/dL (ref 30.0–36.0)
MCV: 87.8 fL (ref 78.0–100.0)
MONOS PCT: 8 %
Monocytes Absolute: 0.8 10*3/uL (ref 0.1–1.0)
NEUTROS ABS: 5.9 10*3/uL (ref 1.7–7.7)
Neutrophils Relative %: 62 %
Platelets: 370 10*3/uL (ref 150–400)
RBC: 4.03 MIL/uL — ABNORMAL LOW (ref 4.22–5.81)
RDW: 12 % (ref 11.5–15.5)
WBC: 9.6 10*3/uL (ref 4.0–10.5)

## 2017-07-05 LAB — COMPREHENSIVE METABOLIC PANEL
ALK PHOS: 68 U/L (ref 38–126)
ALT: 20 U/L (ref 17–63)
AST: 40 U/L (ref 15–41)
Albumin: 3.2 g/dL — ABNORMAL LOW (ref 3.5–5.0)
Anion gap: 16 — ABNORMAL HIGH (ref 5–15)
BILIRUBIN TOTAL: 1.5 mg/dL — AB (ref 0.3–1.2)
BUN: 39 mg/dL — ABNORMAL HIGH (ref 6–20)
CALCIUM: 8.8 mg/dL — AB (ref 8.9–10.3)
CO2: 21 mmol/L — ABNORMAL LOW (ref 22–32)
Chloride: 92 mmol/L — ABNORMAL LOW (ref 101–111)
Creatinine, Ser: 1.55 mg/dL — ABNORMAL HIGH (ref 0.61–1.24)
GFR, EST AFRICAN AMERICAN: 51 mL/min — AB (ref >60)
GFR, EST NON AFRICAN AMERICAN: 44 mL/min — AB (ref >60)
Glucose, Bld: 226 mg/dL — ABNORMAL HIGH (ref 65–99)
POTASSIUM: 2.4 mmol/L — AB (ref 3.5–5.1)
Sodium: 129 mmol/L — ABNORMAL LOW (ref 135–145)
TOTAL PROTEIN: 8.7 g/dL — AB (ref 6.5–8.1)

## 2017-07-05 NOTE — ED Triage Notes (Signed)
Patient reports fatigue , anorexia and generalized weakness onset last month , denies fever or chills .

## 2017-07-06 ENCOUNTER — Observation Stay (HOSPITAL_COMMUNITY)
Admission: EM | Admit: 2017-07-06 | Discharge: 2017-07-09 | Disposition: A | Discharge status: Home / Self Care | Payer: Medicare Other | Attending: Internal Medicine | Admitting: Internal Medicine

## 2017-07-06 ENCOUNTER — Encounter (HOSPITAL_COMMUNITY): Payer: Self-pay | Admitting: Internal Medicine

## 2017-07-06 DIAGNOSIS — I950 Idiopathic hypotension: Secondary | ICD-10-CM

## 2017-07-06 DIAGNOSIS — E871 Hypo-osmolality and hyponatremia: Secondary | ICD-10-CM | POA: Diagnosis present

## 2017-07-06 DIAGNOSIS — J181 Lobar pneumonia, unspecified organism: Secondary | ICD-10-CM | POA: Diagnosis not present

## 2017-07-06 DIAGNOSIS — Z720 Tobacco use: Secondary | ICD-10-CM | POA: Diagnosis present

## 2017-07-06 DIAGNOSIS — I959 Hypotension, unspecified: Secondary | ICD-10-CM | POA: Diagnosis present

## 2017-07-06 DIAGNOSIS — N179 Acute kidney failure, unspecified: Secondary | ICD-10-CM | POA: Diagnosis present

## 2017-07-06 DIAGNOSIS — J189 Pneumonia, unspecified organism: Secondary | ICD-10-CM

## 2017-07-06 DIAGNOSIS — A419 Sepsis, unspecified organism: Secondary | ICD-10-CM | POA: Diagnosis not present

## 2017-07-06 DIAGNOSIS — E876 Hypokalemia: Secondary | ICD-10-CM | POA: Diagnosis not present

## 2017-07-06 DIAGNOSIS — I10 Essential (primary) hypertension: Secondary | ICD-10-CM | POA: Diagnosis present

## 2017-07-06 HISTORY — DX: Tobacco use: Z72.0

## 2017-07-06 LAB — RESPIRATORY PANEL BY PCR
Adenovirus: NOT DETECTED
BORDETELLA PERTUSSIS-RVPCR: NOT DETECTED
CORONAVIRUS 229E-RVPPCR: NOT DETECTED
CORONAVIRUS OC43-RVPPCR: NOT DETECTED
Chlamydophila pneumoniae: NOT DETECTED
Coronavirus HKU1: NOT DETECTED
Coronavirus NL63: NOT DETECTED
INFLUENZA B-RVPPCR: NOT DETECTED
Influenza A: NOT DETECTED
METAPNEUMOVIRUS-RVPPCR: NOT DETECTED
Mycoplasma pneumoniae: NOT DETECTED
PARAINFLUENZA VIRUS 1-RVPPCR: NOT DETECTED
PARAINFLUENZA VIRUS 2-RVPPCR: NOT DETECTED
PARAINFLUENZA VIRUS 3-RVPPCR: NOT DETECTED
PARAINFLUENZA VIRUS 4-RVPPCR: NOT DETECTED
RESPIRATORY SYNCYTIAL VIRUS-RVPPCR: DETECTED — AB
RHINOVIRUS / ENTEROVIRUS - RVPPCR: NOT DETECTED

## 2017-07-06 LAB — INFLUENZA PANEL BY PCR (TYPE A & B)
Influenza A By PCR: NEGATIVE
Influenza B By PCR: NEGATIVE

## 2017-07-06 LAB — URINALYSIS, ROUTINE W REFLEX MICROSCOPIC
BILIRUBIN URINE: NEGATIVE
Glucose, UA: NEGATIVE mg/dL
HGB URINE DIPSTICK: NEGATIVE
Ketones, ur: NEGATIVE mg/dL
Leukocytes, UA: NEGATIVE
Nitrite: NEGATIVE
PH: 7 (ref 5.0–8.0)
Protein, ur: NEGATIVE mg/dL
SPECIFIC GRAVITY, URINE: 1.009 (ref 1.005–1.030)

## 2017-07-06 LAB — HIV ANTIBODY (ROUTINE TESTING W REFLEX): HIV SCREEN 4TH GENERATION: NONREACTIVE

## 2017-07-06 LAB — CREATININE, SERUM
Creatinine, Ser: 1.74 mg/dL — ABNORMAL HIGH (ref 0.61–1.24)
GFR calc Af Amer: 44 mL/min — ABNORMAL LOW (ref >60)
GFR, EST NON AFRICAN AMERICAN: 38 mL/min — AB (ref >60)

## 2017-07-06 LAB — LACTIC ACID, PLASMA
Lactic Acid, Venous: 1.3 mmol/L (ref 0.5–1.9)
Lactic Acid, Venous: 0.9 mmol/L (ref 0.5–1.9)

## 2017-07-06 LAB — I-STAT CG4 LACTIC ACID, ED
Lactic Acid, Venous: 2.19 mmol/L (ref 0.5–1.9)
Lactic Acid, Venous: 1.76 mmol/L (ref 0.5–1.9)

## 2017-07-06 LAB — PROTIME-INR
INR: 1.11
Prothrombin Time: 14.3 s (ref 11.4–15.2)

## 2017-07-06 LAB — MAGNESIUM: MAGNESIUM: 2.3 mg/dL (ref 1.7–2.4)

## 2017-07-06 LAB — CREATININE, URINE, RANDOM: Creatinine, Urine: 96 mg/dL

## 2017-07-06 LAB — PROCALCITONIN: Procalcitonin: 0.26 ng/mL

## 2017-07-06 LAB — STREP PNEUMONIAE URINARY ANTIGEN: Strep Pneumo Urinary Antigen: NEGATIVE

## 2017-07-06 MED ORDER — POTASSIUM CHLORIDE CRYS ER 20 MEQ PO TBCR
40.0000 meq | EXTENDED_RELEASE_TABLET | Freq: Once | ORAL | Status: AC
  Administered 2017-07-06: 40 meq via ORAL
  Filled 2017-07-06: qty 2

## 2017-07-06 MED ORDER — POTASSIUM CHLORIDE 10 MEQ/100ML IV SOLN
10.0000 meq | INTRAVENOUS | Status: AC
  Administered 2017-07-06 (×4): 10 meq via INTRAVENOUS
  Filled 2017-07-06 (×4): qty 100

## 2017-07-06 MED ORDER — TIZANIDINE HCL 4 MG PO TABS
4.0000 mg | ORAL_TABLET | Freq: Every day | ORAL | Status: DC
  Administered 2017-07-07 – 2017-07-09 (×3): 4 mg via ORAL
  Filled 2017-07-06 (×3): qty 1

## 2017-07-06 MED ORDER — ONDANSETRON HCL 4 MG/2ML IJ SOLN: 4.0000 mg | Freq: Three times a day (TID) | INTRAMUSCULAR | Status: DC | PRN

## 2017-07-06 MED ORDER — SODIUM CHLORIDE 0.9 % IV SOLN
INTRAVENOUS | Status: DC
  Administered 2017-07-06 – 2017-07-07 (×4): via INTRAVENOUS

## 2017-07-06 MED ORDER — SODIUM CHLORIDE 0.9 % IV BOLUS (SEPSIS)
1000.0000 mL | Freq: Once | INTRAVENOUS | Status: AC
  Administered 2017-07-06: 1000 mL via INTRAVENOUS

## 2017-07-06 MED ORDER — ALBUTEROL SULFATE (2.5 MG/3ML) 0.083% IN NEBU: 2.5000 mg | INHALATION_SOLUTION | RESPIRATORY_TRACT | Status: DC | PRN

## 2017-07-06 MED ORDER — ZOLPIDEM TARTRATE 5 MG PO TABS
5.0000 mg | ORAL_TABLET | Freq: Every day | ORAL | Status: DC
  Administered 2017-07-06 – 2017-07-08 (×3): 5 mg via ORAL
  Filled 2017-07-06 (×3): qty 1

## 2017-07-06 MED ORDER — NICOTINE 21 MG/24HR TD PT24
21.0000 mg | MEDICATED_PATCH | Freq: Every day | TRANSDERMAL | Status: DC
  Administered 2017-07-06 – 2017-07-09 (×4): 21 mg via TRANSDERMAL
  Filled 2017-07-06 (×4): qty 1

## 2017-07-06 MED ORDER — ACETAMINOPHEN 325 MG PO TABS: 650.0000 mg | ORAL_TABLET | Freq: Four times a day (QID) | ORAL | Status: DC | PRN

## 2017-07-06 MED ORDER — DEXTROSE 5 % IV SOLN
500.0000 mg | INTRAVENOUS | Status: DC
  Administered 2017-07-07: 500 mg via INTRAVENOUS
  Filled 2017-07-06: qty 500

## 2017-07-06 MED ORDER — DEXTROSE 5 % IV SOLN
1.0000 g | Freq: Once | INTRAVENOUS | Status: AC
  Administered 2017-07-06: 1 g via INTRAVENOUS
  Filled 2017-07-06: qty 10

## 2017-07-06 MED ORDER — DEXTROSE 5 % IV SOLN
1.0000 g | INTRAVENOUS | Status: DC
  Administered 2017-07-07: 1 g via INTRAVENOUS
  Filled 2017-07-06: qty 10

## 2017-07-06 MED ORDER — POTASSIUM CHLORIDE IN NACL 40-0.9 MEQ/L-% IV SOLN
INTRAVENOUS | Status: DC
  Administered 2017-07-06 – 2017-07-08 (×5): 100 mL/h via INTRAVENOUS
  Filled 2017-07-06 (×8): qty 1000

## 2017-07-06 MED ORDER — SODIUM CHLORIDE 0.9 % IV BOLUS (SEPSIS)
500.0000 mL | Freq: Once | INTRAVENOUS | Status: AC
  Administered 2017-07-06: 500 mL via INTRAVENOUS

## 2017-07-06 MED ORDER — DEXTROSE 5 % IV SOLN
500.0000 mg | Freq: Once | INTRAVENOUS | Status: AC
  Administered 2017-07-06: 500 mg via INTRAVENOUS
  Filled 2017-07-06: qty 500

## 2017-07-06 MED ORDER — MAGNESIUM SULFATE IN D5W 1-5 GM/100ML-% IV SOLN
1.0000 g | Freq: Once | INTRAVENOUS | Status: AC
Start: 2017-07-06 — End: 2017-07-06
  Administered 2017-07-06: 1 g via INTRAVENOUS
  Filled 2017-07-06: qty 100

## 2017-07-06 MED ORDER — ZOLPIDEM TARTRATE 5 MG PO TABS: 5.0000 mg | ORAL_TABLET | Freq: Every evening | ORAL | Status: DC | PRN

## 2017-07-06 MED ORDER — ENOXAPARIN SODIUM 40 MG/0.4ML ~~LOC~~ SOLN
40.0000 mg | SUBCUTANEOUS | Status: DC
  Filled 2017-07-06: qty 0.4

## 2017-07-06 MED ORDER — HYDRALAZINE HCL 20 MG/ML IJ SOLN
5.0000 mg | INTRAMUSCULAR | Status: DC | PRN
Start: 2017-07-06 — End: 2017-07-09

## 2017-07-06 MED ORDER — DM-GUAIFENESIN ER 30-600 MG PO TB12: 1.0000 | ORAL_TABLET | Freq: Two times a day (BID) | ORAL | Status: DC | PRN

## 2017-07-06 NOTE — ED Notes (Signed)
ED Provider at bedside. 

## 2017-07-06 NOTE — Progress Notes (Signed)
Report received 

## 2017-07-06 NOTE — ED Notes (Signed)
Heart Healthy diet lunch tray ordered @ 1022.

## 2017-07-06 NOTE — ED Notes (Signed)
Attempted report 

## 2017-07-06 NOTE — H&P (Signed)
History and Physical    Sean Farrell:[id number] DOB: 05-17-1946 DOA: 07/06/2017  Referring MD/NP/PA:   PCP: Nolene Ebbs, MD   Patient coming from:  The patient is coming from home.  At baseline, pt is independent for most of ADL.   Chief Complaint: Cough, mild shortness breath, generalized weakness   HPI: Sean Farrell is a 71 y.o. male with medical history significant of hypertension, tobacco abuse, who presents with cough, mild shortness breath and generalized weakness.  Patient states that he has not been feeling good in the past several days. He has generalized weakness, decreased oral intake and nausea. Patient states that he has been having cougg and mild shortness of breath for several days, which has worsened today. He coughs up yellow colored sputum, but denies chest pain. He has runny nose, but no sore throat. Patient does not have abdominal pain, vomiting or diarrhea. He denies symptoms of UTI or unilateral weakness.  ED Course: pt was found to have hypotension with a blood pressure 80/49, which improved to 93/53 up to 2.5 L normal saline bolus, lactic acid is 2.18, WBC 9.6, potassium 2.4, sodium 129, acute renal injury with creatinine 1.55, temperature normal, no tachycardia, oxygen saturation 92% on room air. Chest x-ray showed left base infiltration. Patient is admitted to telemetry bed as inpatient.  Review of Systems:   General: no fevers, chills, no body weight gain, has poor appetite, has fatigue HEENT: no blurry vision, hearing changes or sore throat Respiratory: has dyspnea, coughing, no wheezing CV: no chest pain, no palpitations GI: has nausea, no omiting, abdominal pain, diarrhea, constipation GU: no dysuria, burning on urination, increased urinary frequency, hematuria  Ext: no leg edema Neuro: no unilateral weakness, numbness, or tingling, no vision change or hearing loss Skin: no rash, no skin tear. MSK: No muscle spasm, no deformity, no limitation  of range of movement in spin Heme: No easy bruising.  Travel history: No recent long distant travel.  Allergy: No Known Allergies  Past Medical History:  Diagnosis Date  . Hypertension   . Tobacco abuse     Past Surgical History:  Procedure Laterality Date  . Left ankle surgery      Social History:  reports that he has been smoking.  He uses smokeless tobacco. He reports that he does not drink alcohol or use drugs.  Family History:  Family History  Problem Relation Age of Onset  . Breast cancer Mother   . Alcoholism Father   . Breast cancer Sister      Prior to Admission medications   Medication Sig Start Date End Date Taking? Authorizing Provider  amLODipine (NORVASC) 10 MG tablet Take 10 mg by mouth daily. 05/09/17  Yes [provider]  hydrochlorothiazide (HYDRODIURIL) 25 MG tablet Take 25 mg by mouth daily. 04/05/17  Yes [provider]  tiZANidine (ZANAFLEX) 4 MG tablet Take 4 mg by mouth daily. 05/09/17  Yes [provider]  zolpidem (AMBIEN) 5 MG tablet Take 5 mg by mouth at bedtime. 05/09/17  Yes [provider]    Physical Exam: Vitals:   07/06/17 0300 07/06/17 0315 07/06/17 0330 07/06/17 0433  BP: (!) 100/59 95/60 118/84 (!) 92/58  Pulse: 70  79 73  Resp:  20 (!) 24 17  Temp:      TempSrc:      SpO2: 99% 97% 99% 98%  Weight:       General: Not in acute distress HEENT:  Eyes: PERRL, EOMI, no scleral icterus.       ENT: No discharge from the ears and nose, no pharynx injection, no tonsillar enlargement.        Neck: No JVD, no bruit, no mass felt. Heme: No neck lymph node enlargement. Cardiac: S1/S2, RRR, No murmurs, No gallops or rubs. Respiratory: no rales, wheezing, rhonchi or rubs. GI: Soft, nondistended, nontender, no rebound pain, no organomegaly, BS present. GU: No hematuria Ext: No pitting leg edema bilaterally. 2+DP/PT pulse bilaterally. Musculoskeletal: No joint deformities, No joint redness or warmth, no  limitation of ROM in spin. Skin: No rashes.  Neuro: Alert, oriented X3, cranial nerves II-XII grossly intact, moves all extremities normally. Psych: Patient is not psychotic, no suicidal or hemocidal ideation.  Labs on Admission: I have personally reviewed following labs and imaging studies  CBC: Recent Labs  Lab 07/05/17 2004  WBC 9.6  NEUTROABS 5.9  HGB 12.5*  HCT 35.4*  MCV 87.8  PLT 676   Basic Metabolic Panel: Recent Labs  Lab 07/05/17 2004  NA 129*  K 2.4*  CL 92*  CO2 21*  GLUCOSE 226*  BUN 39*  CREATININE 1.55*  CALCIUM 8.8*   GFR: CrCl cannot be calculated (Unknown ideal weight.). Liver Function Tests: Recent Labs  Lab 07/05/17 2004  AST 40  ALT 20  ALKPHOS 68  BILITOT 1.5*  PROT 8.7*  ALBUMIN 3.2*   No results for input(s): LIPASE, AMYLASE in the last 168 hours. No results for input(s): AMMONIA in the last 168 hours. Coagulation Profile: No results for input(s): INR, PROTIME in the last 168 hours. Cardiac Enzymes: No results for input(s): CKTOTAL, CKMB, CKMBINDEX, TROPONINI in the last 168 hours. BNP (last 3 results) No results for input(s): PROBNP in the last 8760 hours. HbA1C: No results for input(s): HGBA1C in the last 72 hours. CBG: No results for input(s): GLUCAP in the last 168 hours. Lipid Profile: No results for input(s): CHOL, HDL, LDLCALC, TRIG, CHOLHDL, LDLDIRECT in the last 72 hours. Thyroid Function Tests: No results for input(s): TSH, T4TOTAL, FREET4, T3FREE, THYROIDAB in the last 72 hours. Anemia Panel: No results for input(s): VITAMINB12, FOLATE, FERRITIN, TIBC, IRON, RETICCTPCT in the last 72 hours. Urine analysis:    Component Value Date/Time   COLORURINE YELLOW 03/17/2016 1200   APPEARANCEUR CLEAR 03/17/2016 1200   LABSPEC 1.016 03/17/2016 1200   PHURINE 7.0 03/17/2016 1200   GLUCOSEU NEGATIVE 03/17/2016 1200   HGBUR NEGATIVE 03/17/2016 1200   BILIRUBINUR NEGATIVE 03/17/2016 1200   KETONESUR NEGATIVE 03/17/2016 1200     PROTEINUR NEGATIVE 03/17/2016 1200   UROBILINOGEN 2.0 (H) 03/07/2011 1044   NITRITE NEGATIVE 03/17/2016 1200   LEUKOCYTESUR TRACE (A) 03/17/2016 1200   Sepsis Labs: @LABRCNTIP (procalcitonin:4,lacticidven:4) )No results found for this or any previous visit (from the past 240 hour(s)).   Radiological Exams on Admission: Dg Chest 2 View  Result Date: 07/05/2017 CLINICAL DATA:  Fatigue and anorexia EXAM: CHEST  2 VIEW COMPARISON:  03/06/2011 FINDINGS: Right lung is grossly clear. Minimal scarring left base. Small focal opacity at the left base. Normal heart size. No pneumothorax. Scarring in the right upper lobe. IMPRESSION: Small opacity at the left base could reflect a mild infiltrate. Radiographic follow-up to resolution recommended. Electronically Signed   By: Donavan Foil M.D.   On: 07/05/2017 20:39     EKG: Independently reviewed.  Sinus rhythm, QTC 428, LAD, nonspecific T-wave change.  Assessment/Plan Principal Problem:   Lobar pneumonia (HCC) Active Problems:   Hypertension  Sepsis ([place])   Hypokalemia   Hyponatremia   Hypotension   Tobacco abuse   AKI (acute kidney injury) ([place])   Lobar pneumonia, sepsis and hypotension: Blood pressure responded to IV fluid resuscitation, improved to 93/53 after 2.5 L normal saline bolus. Currently hemodynamically stable.  - will admit to tele bed as inpt - IV Rocephin and azithromycin - Mucinex for cough  - prn Albuterol Nebs for SOB - Urine legionella and S. pneumococcal antigen - Follow up blood culture x2, sputum culture and respiratory virus panel, plus Flu pcr - will get Procalcitonin and trend lactic acid level per sepsis protocol - IVF: 3.0 L of NS bolus in ED, followed by 125 mL per hour of NS  Hyponatremia: Likely due to decreased oral intake. Sodium 129. Mental status normal. -IV fluid as above -Follow-up by BMP  AKI: Likely due to prerenal secondary to dehydration and continuation of diuretics. ATN is also possible  due to hypotension - IVF as above - Check  FeUrea - Follow up renal function by BMP - Hold hctz  Hypokalemia: K=2.4 on admission. - Repleted with total of 90 mEq of KCl - give 1 g of magnesium sulfate - Check Mg level  Hypertension: -hold Bp meds due to hypotension  Tobacco abuse: -Did counseling about importance of quitting smoking -Nicotine patch   DVT ppx: SQ Lovenox Code Status: Full code Family Communication: None at bed side.   Disposition Plan:  Anticipate discharge back to previous home environment Consults called:  none Admission status:  Inpatient/tele     Date of Service 07/06/2017    Ivor Costa [facility] Pager [phone]  If 7PM-7AM, please contact night-coverage [email or web address] Password [facility] 07/06/2017, 4:37 AM

## 2017-07-06 NOTE — Progress Notes (Signed)
PROGRESS NOTE  Sean Farrell INO:[id number] DOB: 03-28-1946 DOA: 07/06/2017 PCP: Nolene Ebbs, MD  Brief History:  71 year old male with a history of hypertension and tobacco abuse presenting with 1 week history of generalized weakness, dyspnea on exertion and one episode of nausea and vomiting.  He complains of a cough with yellow sputum production.  Unfortunately, the patient continues to smoke 1 pack/day.  He denies any fevers, chills, chest pain, hemoptysis, diarrhea, hematochezia, melena.  He denies any recent sick contacts.  Upon presentation, the patient was afebrile, but he was hypotensive with blood pressure 80/49.  Patient's blood pressure improved after fluid resuscitation in the emergency department.  Oxygen saturation was 99% with WBC 9.6.  Lactic acid was 2.19 at the time of presentation.  Assessment/Plan: Sepsis -Possibly due to pneumonia -Not completely convinced this represents pneumonia as the patient as the patient is afebrile saturating 97-99% on room air without leukocytosis -may simply due to hypovolemia -Continue IV fluids -Continue ceftriaxone and azithromycin pending culture data -Urinalysis negative for pyuria -Personally reviewed chest x-ray--minimal opacity LLL -Lactic acid peaked at 2.19>>> 0.9  -pro calcitonin 0.26 -Influenza PCR negative -Viral respiratory panel--pending  LLL opacity -Not completely convinced this represents pneumonia as the patient as the patient is afebrile saturating 97-99% on room air -Trend pro calcitonin -Continue ceftriaxone and azithromycin for now  AKI -Baseline creatinine 0.7-0.8 -Presenting creatinine 1.55 -Due to volume depletion -A.m. BMP  Hypokalemia -replete -add KCl to IVF -mag--2.3  Hyponatremia -due to volume depletion -continue IVF -am BMP  Essential hypertension -Holding HCTZ and amlodipine secondary to hypotension  Tobacco abuse -Tobacco cessation discussed       Disposition  Plan:   Home in 2-3  days  Family Communication:  No Family at bedside--Total time spent 35 minutes.  Greater than 50% spent face to face counseling and coordinating care. 0810 to [place]:  none  Code Status:  FULL  DVT Prophylaxis:  [place] Lovenox   Procedures: As Listed in Progress Note Above  Antibiotics: Ceftriaxone 12/28>>> azithro 12/28>>>    Subjective: Patient denies fevers, chills, headache, chest pain, dyspnea, nausea, vomiting, diarrhea, abdominal pain, dysuria, hematuria, hematochezia, and melena.   Objective: Vitals:   07/06/17 0615 07/06/17 0645 07/06/17 0712 07/06/17 0715  BP: (!) 101/54 (!) 97/51 (!) 103/56 99/63  Pulse:    (!) 51  Resp: 20 17 18 19   Temp:      TempSrc:      SpO2:  96% 98% 99%  Weight:        Intake/Output Summary (Last 24 hours) at 07/06/2017 0842 Last data filed at 07/06/2017 0715 Gross per 24 hour  Intake 3800 ml  Output -  Net 3800 ml   Weight change:  Exam:   General:  Pt is alert, follows commands appropriately, not in acute distress  HEENT: No icterus, No thrush, No neck mass, [place]/AT  Cardiovascular: RRR, S1/S2, no rubs, no gallops  Respiratory: Left basilar crackles.  Diminished breath sounds bilateral.  No wheezing.  Abdomen: Soft/+BS, non tender, non distended, no guarding  Extremities: No edema, No lymphangitis, No petechiae, No rashes, no synovitis   Data Reviewed: I have personally reviewed following labs and imaging studies Basic Metabolic Panel: Recent Labs  Lab 07/05/17 2004 07/06/17 0407  NA 129*  --   K 2.4*  --   CL 92*  --   CO2 21*  --   GLUCOSE 226*  --  BUN 39*  --   CREATININE 1.55* 1.74*  CALCIUM 8.8*  --   MG  --  2.3   Liver Function Tests: Recent Labs  Lab 07/05/17 2004  AST 40  ALT 20  ALKPHOS 68  BILITOT 1.5*  PROT 8.7*  ALBUMIN 3.2*   No results for input(s): LIPASE, AMYLASE in the last 168 hours. No results for input(s): AMMONIA in the last 168  hours. Coagulation Profile: Recent Labs  Lab 07/06/17 0407  INR 1.11   CBC: Recent Labs  Lab 07/05/17 2004  WBC 9.6  NEUTROABS 5.9  HGB 12.5*  HCT 35.4*  MCV 87.8  PLT 370   Cardiac Enzymes: No results for input(s): CKTOTAL, CKMB, CKMBINDEX, TROPONINI in the last 168 hours. BNP: Invalid input(s): POCBNP CBG: No results for input(s): GLUCAP in the last 168 hours. HbA1C: No results for input(s): HGBA1C in the last 72 hours. Urine analysis:    Component Value Date/Time   COLORURINE YELLOW 07/06/2017 0420   APPEARANCEUR CLEAR 07/06/2017 0420   LABSPEC 1.009 07/06/2017 0420   PHURINE 7.0 07/06/2017 0420   GLUCOSEU NEGATIVE 07/06/2017 0420   HGBUR NEGATIVE 07/06/2017 0420   BILIRUBINUR NEGATIVE 07/06/2017 0420   KETONESUR NEGATIVE 07/06/2017 0420   PROTEINUR NEGATIVE 07/06/2017 0420   UROBILINOGEN 2.0 (H) 03/07/2011 1044   NITRITE NEGATIVE 07/06/2017 0420   LEUKOCYTESUR NEGATIVE 07/06/2017 0420   Sepsis Labs: @LABRCNTIP (procalcitonin:4,lacticidven:4) )No results found for this or any previous visit (from the past 240 hour(s)).   Scheduled Meds: . enoxaparin (LOVENOX) injection  40 mg Subcutaneous Q24H  . nicotine  21 mg Transdermal Daily  . tiZANidine  4 mg Oral Daily  . zolpidem  5 mg Oral QHS   Continuous Infusions: . sodium chloride 125 mL/hr at 07/06/17 0333  . [START ON 07/07/2017] azithromycin    . [START ON 07/07/2017] cefTRIAXone (ROCEPHIN)  IV      Procedures/Studies: Dg Chest 2 View  Result Date: 07/05/2017 CLINICAL DATA:  Fatigue and anorexia EXAM: CHEST  2 VIEW COMPARISON:  03/06/2011 FINDINGS: Right lung is grossly clear. Minimal scarring left base. Small focal opacity at the left base. Normal heart size. No pneumothorax. Scarring in the right upper lobe. IMPRESSION: Small opacity at the left base could reflect a mild infiltrate. Radiographic follow-up to resolution recommended. Electronically Signed   By: Donavan Foil M.D.   On: 07/05/2017 20:39     Orson Eva, DO  [facility] Pager [phone]  If 7PM-7AM, please contact night-coverage [email or web address] Password [email or web address] 07/06/2017, 8:42 AM   LOS: 0 days

## 2017-07-06 NOTE — ED Notes (Signed)
CRITICAL VALUE ALERT  Critical Value:  RSV positive   Date & Time Notied:  07/06/2017 1027  Provider Notified: Dr. Carles Collet

## 2017-07-06 NOTE — Progress Notes (Signed)
Sean Farrell [id number] Admission Data: 07/06/2017 6:51 PM Attending Provider: Orson Eva, MD  QBH:[id number], Sean Grief, MD Consults/ Treatment Team:   Sean Farrell is a 71 y.o. male patient admitted from ED awake, alert  & orientated  X 3,  Full Code, VSS - Blood pressure 137/62, pulse (!) 29, temperature 98.1 F (36.7 C), resp. rate 16, weight 73.2 kg (161 lb 4.8 oz), SpO2 100 %., O2    2 L nasal cannular, no c/o shortness of breath, no c/o chest pain, no distress noted. Tele # 24 placed and pt is currently running:sinus tachycardia.   IV site WDL:  antecubital left, condition patent and no redness with a transparent dsg that's clean dry and intact. Forearm right, condition patient and no redness with a transparent dsg that's clean dry and intact.   Allergies:  No Known Allergies   Past Medical History:  Diagnosis Date  . Hypertension   . Tobacco abuse     History:  obtained from chart review. Tobacco/alcohol: Smoked 1 packs per day for ?? years none  Pt orientation to unit, room and routine. Information packet given to patient/family and safety video watched.  Admission INP armband ID verified with patient/family, and in place. SR up x 2, fall risk assessment complete with Patient and family verbalizing understanding of risks associated with falls. Pt verbalizes an understanding of how to use the call bell and to call for help before getting out of bed.  Skin, clean-dry- intact without evidence of bruising, or skin tears.   No evidence of skin break down noted on exam. color normal, vascularity normal, no rashes or suspicious lesions, no evidence of bleeding or bruising, no lesions noted, no rash, no edema, temperature normal, texture normal, mobility and turgor normal    Will cont to monitor and assist as needed.  Salley Slaughter, RN 07/06/2017 6:51 PM

## 2017-07-06 NOTE — ED Provider Notes (Signed)
TIME SEEN: 12:24 AM  CHIEF COMPLAINT: Generalized weakness, fatigue, cough, nausea, anorexia  HPI: Patient is a 71 year old male with history of hypertension who is followed by Dr. Jeanie Cooks who presents to the emergency department with several days of complaints of cough with yellow sputum production, generalized fatigue and weakness.  Also reports decreased appetite over the past several days.  Has had nausea but no vomiting or diarrhea.  No focal numbness, tingling or weakness.  No headache.  No known sick contacts.  Did recently receive an influenza vaccination.  Denies any known fever.  Has had intermittent chest pain or shortness of breath but none currently.  ROS: See HPI Constitutional: no fever  Eyes: no drainage  ENT: no runny nose   Cardiovascular:  no chest pain  Resp: no SOB  GI: no vomiting GU: no dysuria Integumentary: no rash  Allergy: no hives  Musculoskeletal: no leg swelling  Neurological: no slurred speech ROS otherwise negative  PAST MEDICAL HISTORY/PAST SURGICAL HISTORY:  Past Medical History:  Diagnosis Date  . Hypertension     MEDICATIONS:  Prior to Admission medications   Not on File    ALLERGIES:  No Known Allergies  SOCIAL HISTORY:  Social History   Tobacco Use  . Smoking status: Current Every Day Smoker  . Smokeless tobacco: Current User  Substance Use Topics  . Alcohol use: No    FAMILY HISTORY: No family history on file.  EXAM: BP (!) 92/55 (BP Location: Right Arm)   Pulse 82   Temp 98.7 F (37.1 C) (Oral)   Resp 15   SpO2 100%  CONSTITUTIONAL: Alert and oriented and responds appropriately to questions.  Chronically ill-appearing, appears older than stated age, afebrile HEAD: Normocephalic EYES: Conjunctivae clear, pupils appear equal, EOMI ENT: normal nose; moist mucous membranes NECK: Supple, no meningismus, no nuchal rigidity, no LAD  CARD: RRR; S1 and S2 appreciated; no murmurs, no clicks, no rubs, no gallops RESP: Normal  chest excursion without splinting or tachypnea; breath sounds clear and equal bilaterally; no wheezes, no rhonchi, no rales, no hypoxia or respiratory distress, speaking full sentences ABD/GI: Normal bowel sounds; non-distended; soft, non-tender, no rebound, no guarding, no peritoneal signs, no hepatosplenomegaly BACK:  The back appears normal and is non-tender to palpation, there is no CVA tenderness EXT: Normal ROM in all joints; non-tender to palpation; no edema; normal capillary refill; no cyanosis, no calf tenderness or swelling    SKIN: Normal color for age and race; warm; no rash NEURO: Moves all extremities equally, reports normal sensation diffusely, speech is normal, cranial nerves II through XII intact PSYCH: The patient's mood and manner are appropriate. Grooming and personal hygiene are appropriate.  MEDICAL DECISION MAKING: Patient here generalized weakness, fatigue and anorexia.  Found to be hypotensive, hypokalemic, mild AK I and a left lower lobe pneumonia.  Will treat with Rocephin and azithromycin for community-acquired pneumonia.  Will give IV fluids for his hypotension and AK I.  Will give oral and IV replacement for his potassium.  No EKG changes.  Patient will need admission.  ED PROGRESS: Patient's blood pressures improving with IV dehydration.  Mentating normally.   2:30 AM Discussed patient's case with hospitalist, Dr. Blaine Hamper.  I have recommended admission and patient (and family if present) agree with this plan. Admitting physician will place admission orders.   I reviewed all nursing notes, vitals, pertinent previous records, EKGs, lab and urine results, imaging (as available).          EKG  Interpretation  Date/Time:  Friday July 05 2017 19:39:10 EST Ventricular Rate:  100 PR Interval:  250 QRS Duration: 88 QT Interval:  332 QTC Calculation: 428 R Axis:   -39 Text Interpretation:  Sinus rhythm with 1st degree A-V block Biatrial enlargement Left axis  deviation Abnormal ECG No significant change since last tracing Confirmed by Olvin Rohr, Cyril Mourning [phone]) on 07/06/2017 12:24:31 AM         CRITICAL CARE Performed by: Cyril Mourning Hadlie Gipson   Total critical care time: 45 minutes  Critical care time was exclusive of separately billable procedures and treating other patients.  Critical care was necessary to treat or prevent imminent or life-threatening deterioration.  Critical care was time spent personally by me on the following activities: development of treatment plan with patient and/or surrogate as well as nursing, discussions with consultants, evaluation of patient's response to treatment, examination of patient, obtaining history from patient or surrogate, ordering and performing treatments and interventions, ordering and review of laboratory studies, ordering and review of radiographic studies, pulse oximetry and re-evaluation of patient's condition.    Taleisha Kaczynski, Delice Bison, DO 07/06/17 [phone]

## 2017-07-06 NOTE — ED Notes (Signed)
Heart Healthy diet breakfast tray ordered @ 0401.

## 2017-07-07 DIAGNOSIS — E871 Hypo-osmolality and hyponatremia: Secondary | ICD-10-CM | POA: Diagnosis not present

## 2017-07-07 DIAGNOSIS — Z720 Tobacco use: Secondary | ICD-10-CM | POA: Diagnosis not present

## 2017-07-07 DIAGNOSIS — E876 Hypokalemia: Secondary | ICD-10-CM | POA: Diagnosis not present

## 2017-07-07 DIAGNOSIS — I959 Hypotension, unspecified: Secondary | ICD-10-CM | POA: Diagnosis not present

## 2017-07-07 DIAGNOSIS — N179 Acute kidney failure, unspecified: Secondary | ICD-10-CM | POA: Diagnosis not present

## 2017-07-07 DIAGNOSIS — J181 Lobar pneumonia, unspecified organism: Secondary | ICD-10-CM | POA: Diagnosis not present

## 2017-07-07 DIAGNOSIS — I10 Essential (primary) hypertension: Secondary | ICD-10-CM | POA: Diagnosis not present

## 2017-07-07 LAB — CBC
HEMATOCRIT: 30.5 % — AB (ref 39.0–52.0)
HEMOGLOBIN: 10.5 g/dL — AB (ref 13.0–17.0)
MCH: 30.6 pg (ref 26.0–34.0)
MCHC: 34.4 g/dL (ref 30.0–36.0)
MCV: 88.9 fL (ref 78.0–100.0)
Platelets: 321 10*3/uL (ref 150–400)
RBC: 3.43 MIL/uL — AB (ref 4.22–5.81)
RDW: 12.4 % (ref 11.5–15.5)
WBC: 6.9 10*3/uL (ref 4.0–10.5)

## 2017-07-07 LAB — MAGNESIUM: MAGNESIUM: 1.2 mg/dL — AB (ref 1.7–2.4)

## 2017-07-07 LAB — URINE CULTURE: Culture: NO GROWTH

## 2017-07-07 LAB — BASIC METABOLIC PANEL
Anion gap: 6 (ref 5–15)
BUN: 8 mg/dL (ref 6–20)
CHLORIDE: 107 mmol/L (ref 101–111)
CO2: 19 mmol/L — ABNORMAL LOW (ref 22–32)
Calcium: 8.1 mg/dL — ABNORMAL LOW (ref 8.9–10.3)
Creatinine, Ser: 0.61 mg/dL (ref 0.61–1.24)
GFR calc non Af Amer: >60 mL/min (ref >60)
Glucose, Bld: 109 mg/dL — ABNORMAL HIGH (ref 65–99)
POTASSIUM: 3.3 mmol/L — AB (ref 3.5–5.1)
SODIUM: 132 mmol/L — AB (ref 135–145)

## 2017-07-07 LAB — LEGIONELLA PNEUMOPHILA SEROGP 1 UR AG: L. pneumophila Serogp 1 Ur Ag: NEGATIVE

## 2017-07-07 LAB — T4, FREE: FREE T4: 1.51 ng/dL — AB (ref 0.61–1.12)

## 2017-07-07 LAB — UREA NITROGEN, URINE: UREA NITROGEN UR: 336 mg/dL

## 2017-07-07 LAB — TSH: TSH: 1.325 u[IU]/mL (ref 0.350–4.500)

## 2017-07-07 LAB — PROCALCITONIN: Procalcitonin: <0.1 ng/mL

## 2017-07-07 MED ORDER — MAGNESIUM SULFATE 4 GM/100ML IV SOLN
4.0000 g | Freq: Once | INTRAVENOUS | Status: AC
  Administered 2017-07-07: 4 g via INTRAVENOUS
  Filled 2017-07-07: qty 100

## 2017-07-07 MED ORDER — METOPROLOL TARTRATE 12.5 MG HALF TABLET
12.5000 mg | ORAL_TABLET | Freq: Two times a day (BID) | ORAL | Status: DC
  Administered 2017-07-07 – 2017-07-09 (×5): 12.5 mg via ORAL
  Filled 2017-07-07 (×5): qty 1

## 2017-07-07 MED ORDER — POTASSIUM CHLORIDE CRYS ER 20 MEQ PO TBCR
40.0000 meq | EXTENDED_RELEASE_TABLET | Freq: Once | ORAL | Status: AC
  Administered 2017-07-07: 40 meq via ORAL
  Filled 2017-07-07: qty 2

## 2017-07-07 MED ORDER — AZITHROMYCIN 500 MG PO TABS
500.0000 mg | ORAL_TABLET | Freq: Every day | ORAL | Status: AC
  Administered 2017-07-08: 500 mg via ORAL
  Filled 2017-07-07: qty 1

## 2017-07-07 MED ORDER — MAGNESIUM SULFATE 2 GM/50ML IV SOLN: 2.0000 g | Freq: Once | INTRAVENOUS | Status: DC

## 2017-07-07 NOTE — Evaluation (Signed)
Physical Therapy Evaluation Patient Details Name: LEWIN PELLOW MRN: [id number] DOB: 1946/03/13 Today's Date: 07/07/2017   History of Present Illness  71 year old male with a history of hypertension and tobacco abuse presenting with 1 week history of generalized weakness, dyspnea on exertion and one episode of nausea and vomiting.  He complains of a cough with yellow sputum production.  Unfortunately, the patient continues to smoke 1 pack/day.  He denies any fevers, chills, chest pain, hemoptysis, diarrhea, hematochezia, melena.  He denies any recent sick contacts.  Upon presentation, the patient was afebrile, but he was hypotensive with blood pressure 80/49.  Patient's blood pressure improved after fluid resuscitation in the emergency department.  Oxygen saturation was 99% with WBC 9.6.  Lactic acid was 2.19 at the time of presentation.  Clinical Impression  Patient presents with decreased mobility due to general weakness and limited activity tolerance.  He will benefit from skilled PT in the acute setting to allow return home with aide help daily as needed.  Likely  Not to need follow up HHPT, but will monitor for progress during acute stay.     Follow Up Recommendations No PT follow up    Equipment Recommendations  None recommended by PT    Recommendations for Other Services       Precautions / Restrictions Precautions Precautions: Fall      Mobility  Bed Mobility Overal bed mobility: Needs Assistance Bed Mobility: Supine to Sit;Sit to Supine     Supine to sit: Supervision;HOB elevated Sit to supine: Supervision;HOB elevated   General bed mobility comments: assist for lines, cues for positioning  Transfers Overall transfer level: Needs assistance Equipment used: Straight cane Transfers: Sit to/from Stand Sit to Stand: Supervision         General transfer comment: from EOB, stood unaided from low toilet with grabbar  Ambulation/Gait Ambulation/Gait assistance:  Supervision Ambulation Distance (Feet): 12 Feet(x 2) Assistive device: Straight cane;None Gait Pattern/deviations: Decreased stride length     General Gait Details: To bathroom and back only per pt due to not feeling well and preferred back to bed after toileting in bathroom  Stairs            Wheelchair Mobility    Modified Rankin (Stroke Patients Only)       Balance Overall balance assessment: Needs assistance   Sitting balance-Leahy Scale: Good       Standing balance-Leahy Scale: Fair                               Pertinent Vitals/Pain Pain Assessment: Faces Faces Pain Scale: Hurts a little bit Pain Location: generalized Pain Descriptors / Indicators: Aching Pain Intervention(s): Monitored during session;Repositioned    Home Living Family/patient expects to be discharged to:: Private residence Living Arrangements: Alone Available Help at Discharge: Personal care attendant Type of Home: Apartment Home Access: Level entry     Home Layout: One level Home Equipment: Marine scientist - single point      Prior Function Level of Independence: Needs assistance      ADL's / Homemaking Assistance Needed: has aide that comes sometimes every day (2 hours weekdays and 1 hour weekends) to help with cooking and housekeeping, states he doesn't usually keep them that often.        Hand Dominance        Extremity/Trunk Assessment   Upper Extremity Assessment Upper Extremity Assessment: Generalized weakness    Lower Extremity Assessment  Lower Extremity Assessment: Generalized weakness    Cervical / Trunk Assessment Cervical / Trunk Assessment: Kyphotic  Communication   Communication: No difficulties  Cognition Arousal/Alertness: Awake/alert Behavior During Therapy: WFL for tasks assessed/performed Overall Cognitive Status: Within Functional Limits for tasks assessed                                        General  Comments General comments (skin integrity, edema, etc.): SpO2 difficulty reading, but looked like stayed at 90 or above and pt maintained on 2L O2    Exercises     Assessment/Plan    PT Assessment Patient needs continued PT services  PT Problem List Decreased strength;Decreased activity tolerance;Decreased balance;Decreased mobility       PT Treatment Interventions DME instruction;Balance training;Gait training;Stair training;Functional mobility training;Patient/family education;Therapeutic activities    PT Goals (Current goals can be found in the Care Plan section)  Acute Rehab PT Goals Patient Stated Goal: To go home PT Goal Formulation: With patient Time For Goal Achievement: 07/10/17 Potential to Achieve Goals: Good    Frequency Min 3X/week   Barriers to discharge        Co-evaluation               AM-PAC PT "6 Clicks" Daily Activity  Outcome Measure Difficulty turning over in bed (including adjusting bedclothes, sheets and blankets)?: None Difficulty moving from lying on back to sitting on the side of the bed? : A Little Difficulty sitting down on and standing up from a chair with arms (e.g., wheelchair, bedside commode, etc,.)?: A Little Help needed moving to and from a bed to chair (including a wheelchair)?: A Little   Help needed climbing 3-5 steps with a railing? : A Little 6 Click Score: 16    End of Session Equipment Utilized During Treatment: Oxygen Activity Tolerance: Patient tolerated treatment well Patient left: in bed;with call bell/phone within reach   PT Visit Diagnosis: Muscle weakness (generalized) (M62.81);Other abnormalities of gait and mobility (R26.89)    Time: 8182-9937 PT Time Calculation (min) (ACUTE ONLY): 32 min   Charges:   PT Evaluation $PT Eval Low Complexity: 1 Low PT Treatments $Gait Training: 8-22 mins   PT G CodesMagda Kiel, [place] [phone] 07/07/2017   Reginia Naas 07/07/2017, 3:37 PM

## 2017-07-07 NOTE — Progress Notes (Signed)
PROGRESS NOTE  Sean Farrell FTD:[id number] DOB: Dec 21, 1945 DOA: 07/06/2017 PCP: Nolene Ebbs, MD  Brief History:  71 year old male with a history of hypertension and tobacco abuse presenting with 1 week history of generalized weakness, dyspnea on exertion and one episode of nausea and vomiting. He complains of a cough with yellow sputum production. Unfortunately, the patient continues to smoke 1 pack/day. He denies any fevers, chills, chest pain, hemoptysis, diarrhea, hematochezia, melena. He denies any recent sick contacts. Upon presentation, the patient was afebrile, but he was hypotensive with blood pressure 80/49. Patient's blood pressure improved after fluid resuscitation in the emergency department. Oxygen saturation was 99% on RA with WBC 9.6. Lactic acid was 2.19 at the time of presentation.  The patient was initially started on ceftriaxone and azithromycin.  Respiratory viral panel returned showing RSV.  His antibiotics were de-escalated.  The patient had brief episodes of SVT during which he was asymptomatic.  This was likely attributable to the patient's acute medical illness and COPD.  The patient was started on low-dose metoprolol tartrate.  Echo was ordered    Assessment/Plan: SIRS/Hypotension -initially felt to be due to pneumonia -sepsis ruled out -Not completely convinced this represents pneumonia as the patient as the patient is afebrile saturating 97-99% on room airwithout leukocytosis -may simply be due to hypovolemia -Continue IV fluids -Urinalysis negative for pyuria -blood cultures neg -Personally reviewed chest x-ray--minimal opacityLLL -Lactic acid peaked at 2.19>>>0.9  -pro calcitonin 0.26>>>less than <0.10 -Influenza PCR negative -Viral respiratory panel--positive for RSV -d/c ceftriaxone; last dose (3 days) azithromycin on 12/31 am  LLLopacity -Not completely convinced this represents pneumonia as the patient as the patient is  afebrile saturating 97-99% on room air -Trend pro calcitonin -Initially started ceftriaxone and azithromycin-->received 2 days -home with one more day azithromycin to complete 3 days  Supraventricular tachycardia -pt had 2 brief asymptomatic episodes overnight and one episode this am 12/30--personally reviewed telemetry -likely due to acute illness and COPD -started low dose metoprolol tartrate -Echo -TSH/Free T4--consistent with euthyroid sick illness-->repeat in one month once medically stable  AKI -Baseline creatinine 0.7-0.8 -Presenting creatinine 1.55 -Due to volume depletion -am BMP  Hypokalemia/Hypomagnesemia -repleted -add KCl to IVF -mag--1.2  Hyponatremia -due to volume depletion -continue IVF -improved with IVF  Essential hypertension -Holding HCTZ and amlodipine secondary to hypotension -will not restart HCTZ or amlodipine -started low dose metoprolol in setting of SVT  Tobacco abuse -Tobacco cessation discussed    Disposition Plan:   Home 07/08/17 if echo neg and no further SVT Family Communication:  No Family at bedside  Consultants:  none  Code Status:  FULL   DVT Prophylaxis:  [place] Lovenox   Procedures: As Listed in Progress Note Above  Antibiotics: None    Subjective: Patient denies fevers, chills, headache, chest pain, dyspnea, nausea, vomiting, diarrhea, abdominal pain, dysuria, hematuria, hematochezia, and melena.  He is feeling stronger today, but still feels weak overall.   Objective: Vitals:   07/06/17 1713 07/06/17 2131 07/07/17 0551 07/07/17 0926  BP: 137/62 116/60 (!) 114/57 (!) 114/57  Pulse: (!) 29 80 77 (!) 103  Resp: 16 18 18    Temp: 98.1 F (36.7 C) 98.7 F (37.1 C) 98.6 F (37 C)   TempSrc:  Oral Oral   SpO2: 100% 99% 97%   Weight:        Intake/Output Summary (Last 24 hours) at 07/07/2017 1622 Last data filed at 07/07/2017 1107 Gross per 24  hour  Intake 5169.58 ml  Output 2960 ml  Net 2209.58 ml    Weight change:  Exam:   General:  Pt is alert, follows commands appropriately, not in acute distress  HEENT: No icterus, No thrush, No neck mass, [place]/AT  Cardiovascular: RRR, S1/S2, no rubs, no gallops  Respiratory: Diminished breath sounds at the bases.  Left basilar rales.  Abdomen: Soft/+BS, non tender, non distended, no guarding  Extremities: No edema, No lymphangitis, No petechiae, No rashes, no synovitis   Data Reviewed: I have personally reviewed following labs and imaging studies Basic Metabolic Panel: Recent Labs  Lab 07/05/17 2004 07/06/17 0407 07/07/17 0343 07/07/17 0743  NA 129*  --  132*  --   K 2.4*  --  3.3*  --   CL 92*  --  107  --   CO2 21*  --  19*  --   GLUCOSE 226*  --  109*  --   BUN 39*  --  8  --   CREATININE 1.55* 1.74* 0.61  --   CALCIUM 8.8*  --  8.1*  --   MG  --  2.3  --  1.2*   Liver Function Tests: Recent Labs  Lab 07/05/17 2004  AST 40  ALT 20  ALKPHOS 68  BILITOT 1.5*  PROT 8.7*  ALBUMIN 3.2*   No results for input(s): LIPASE, AMYLASE in the last 168 hours. No results for input(s): AMMONIA in the last 168 hours. Coagulation Profile: Recent Labs  Lab 07/06/17 0407  INR 1.11   CBC: Recent Labs  Lab 07/05/17 2004 07/07/17 0343  WBC 9.6 6.9  NEUTROABS 5.9  --   HGB 12.5* 10.5*  HCT 35.4* 30.5*  MCV 87.8 88.9  PLT 370 321   Cardiac Enzymes: No results for input(s): CKTOTAL, CKMB, CKMBINDEX, TROPONINI in the last 168 hours. BNP: Invalid input(s): POCBNP CBG: No results for input(s): GLUCAP in the last 168 hours. HbA1C: No results for input(s): HGBA1C in the last 72 hours. Urine analysis:    Component Value Date/Time   COLORURINE YELLOW 07/06/2017 0420   APPEARANCEUR CLEAR 07/06/2017 0420   LABSPEC 1.009 07/06/2017 0420   PHURINE 7.0 07/06/2017 0420   GLUCOSEU NEGATIVE 07/06/2017 0420   HGBUR NEGATIVE 07/06/2017 0420   BILIRUBINUR NEGATIVE 07/06/2017 0420   KETONESUR NEGATIVE 07/06/2017 0420   PROTEINUR  NEGATIVE 07/06/2017 0420   UROBILINOGEN 2.0 (H) 03/07/2011 1044   NITRITE NEGATIVE 07/06/2017 0420   LEUKOCYTESUR NEGATIVE 07/06/2017 0420   Sepsis Labs: @LABRCNTIP (procalcitonin:4,lacticidven:4) ) Recent Results (from the past 240 hour(s))  Blood Culture (routine x 2)     Status: None (Preliminary result)   Collection Time: 07/06/17  1:20 AM  Result Value Ref Range Status   Specimen Description BLOOD RIGHT FOREARM  Final   Special Requests   Final    BOTTLES DRAWN AEROBIC AND ANAEROBIC Blood Culture adequate volume   Culture NO GROWTH 1 DAY  Final   Report Status PENDING  Incomplete  Blood Culture (routine x 2)     Status: None (Preliminary result)   Collection Time: 07/06/17  1:27 AM  Result Value Ref Range Status   Specimen Description BLOOD LEFT ANTECUBITAL  Final   Special Requests   Final    BOTTLES DRAWN AEROBIC AND ANAEROBIC Blood Culture adequate volume   Culture NO GROWTH 1 DAY  Final   Report Status PENDING  Incomplete  Respiratory Panel by PCR     Status: Abnormal   Collection Time: 07/06/17  3:25 AM  Result Value Ref Range Status   Adenovirus NOT DETECTED NOT DETECTED Final   Coronavirus 229E NOT DETECTED NOT DETECTED Final   Coronavirus HKU1 NOT DETECTED NOT DETECTED Final   Coronavirus NL63 NOT DETECTED NOT DETECTED Final   Coronavirus OC43 NOT DETECTED NOT DETECTED Final   Metapneumovirus NOT DETECTED NOT DETECTED Final   Rhinovirus / Enterovirus NOT DETECTED NOT DETECTED Final   Influenza A NOT DETECTED NOT DETECTED Final   Influenza B NOT DETECTED NOT DETECTED Final   Parainfluenza Virus 1 NOT DETECTED NOT DETECTED Final   Parainfluenza Virus 2 NOT DETECTED NOT DETECTED Final   Parainfluenza Virus 3 NOT DETECTED NOT DETECTED Final   Parainfluenza Virus 4 NOT DETECTED NOT DETECTED Final   Respiratory Syncytial Virus DETECTED (A) NOT DETECTED Final    Comment: CRITICAL RESULT CALLED TO, READ BACK BY AND VERIFIED WITH: B MILLER,RN AT 6761 07/06/17 BY L  BENFIELD    Bordetella pertussis NOT DETECTED NOT DETECTED Final   Chlamydophila pneumoniae NOT DETECTED NOT DETECTED Final   Mycoplasma pneumoniae NOT DETECTED NOT DETECTED Final  Urine culture     Status: None   Collection Time: 07/06/17  4:20 AM  Result Value Ref Range Status   Specimen Description URINE, CLEAN CATCH  Final   Special Requests NONE  Final   Culture NO GROWTH  Final   Report Status 07/07/2017 FINAL  Final     Scheduled Meds: . [START ON 07/08/2017] azithromycin  500 mg Oral Daily  . enoxaparin (LOVENOX) injection  40 mg Subcutaneous Q24H  . metoprolol tartrate  12.5 mg Oral BID  . nicotine  21 mg Transdermal Daily  . tiZANidine  4 mg Oral Daily  . zolpidem  5 mg Oral QHS   Continuous Infusions: . 0.9 % NaCl with KCl 40 mEq / L 100 mL/hr (07/07/17 0549)  . magnesium sulfate 1 - 4 g bolus IVPB 4 g (07/07/17 1507)    Procedures/Studies: Dg Chest 2 View  Result Date: 07/05/2017 CLINICAL DATA:  Fatigue and anorexia EXAM: CHEST  2 VIEW COMPARISON:  03/06/2011 FINDINGS: Right lung is grossly clear. Minimal scarring left base. Small focal opacity at the left base. Normal heart size. No pneumothorax. Scarring in the right upper lobe. IMPRESSION: Small opacity at the left base could reflect a mild infiltrate. Radiographic follow-up to resolution recommended. Electronically Signed   By: Donavan Foil M.D.   On: 07/05/2017 20:39    Orson Eva, DO  [facility] Pager [phone]  If 7PM-7AM, please contact night-coverage [email or web address] Password [email or web address] 07/07/2017, 4:22 PM   LOS: 1 day

## 2017-07-08 ENCOUNTER — Other Ambulatory Visit: Payer: Self-pay

## 2017-07-08 ENCOUNTER — Encounter (HOSPITAL_COMMUNITY): Payer: Self-pay

## 2017-07-08 ENCOUNTER — Inpatient Hospital Stay (HOSPITAL_BASED_OUTPATIENT_CLINIC_OR_DEPARTMENT_OTHER): Payer: Medicare Other

## 2017-07-08 DIAGNOSIS — E876 Hypokalemia: Secondary | ICD-10-CM | POA: Diagnosis not present

## 2017-07-08 DIAGNOSIS — A419 Sepsis, unspecified organism: Secondary | ICD-10-CM

## 2017-07-08 DIAGNOSIS — N179 Acute kidney failure, unspecified: Secondary | ICD-10-CM | POA: Diagnosis not present

## 2017-07-08 DIAGNOSIS — I959 Hypotension, unspecified: Secondary | ICD-10-CM

## 2017-07-08 DIAGNOSIS — E871 Hypo-osmolality and hyponatremia: Secondary | ICD-10-CM | POA: Diagnosis not present

## 2017-07-08 DIAGNOSIS — I10 Essential (primary) hypertension: Secondary | ICD-10-CM | POA: Diagnosis not present

## 2017-07-08 DIAGNOSIS — Z720 Tobacco use: Secondary | ICD-10-CM | POA: Diagnosis not present

## 2017-07-08 DIAGNOSIS — J181 Lobar pneumonia, unspecified organism: Secondary | ICD-10-CM | POA: Diagnosis not present

## 2017-07-08 DIAGNOSIS — I361 Nonrheumatic tricuspid (valve) insufficiency: Secondary | ICD-10-CM

## 2017-07-08 LAB — BASIC METABOLIC PANEL
ANION GAP: 7 (ref 5–15)
BUN: 5 mg/dL — ABNORMAL LOW (ref 6–20)
CALCIUM: 8.3 mg/dL — AB (ref 8.9–10.3)
CO2: 20 mmol/L — ABNORMAL LOW (ref 22–32)
Chloride: 105 mmol/L (ref 101–111)
Creatinine, Ser: 0.64 mg/dL (ref 0.61–1.24)
GLUCOSE: 99 mg/dL (ref 65–99)
Potassium: 4 mmol/L (ref 3.5–5.1)
SODIUM: 132 mmol/L — AB (ref 135–145)

## 2017-07-08 LAB — ECHOCARDIOGRAM COMPLETE
HEIGHTINCHES: 75 in
Weight: 2580.8 oz

## 2017-07-08 LAB — MAGNESIUM: MAGNESIUM: 1.6 mg/dL — AB (ref 1.7–2.4)

## 2017-07-08 NOTE — Care Management Obs Status (Signed)
[place] NOTIFICATION   Patient Details  Name: Sean KUSHNIR MRN: [id number] Date of Birth: 28-Aug-1945   Medicare Observation Status Notification Given:  Yes    Pollie Friar, RN 07/08/2017, 4:03 PM

## 2017-07-08 NOTE — Progress Notes (Signed)
PROGRESS NOTE  Sean Farrell:[id number] DOB: May 31, 1946 DOA: 07/06/2017 PCP: Nolene Ebbs, MD  HPI/Recap of past 17 hours: 71 year old male with a history of hypertension and tobacco abuse presenting with 1 week history of generalized weakness, dyspnea on exertion and one episode of nausea and vomiting. He complains of a cough with yellow sputum production. Unfortunately, the patient continues to smoke 1 pack/day. He denies any fevers, chills, chest pain, hemoptysis, diarrhea, hematochezia, melena. He denies any recent sick contacts. Upon presentation, the patient was afebrile, but he was hypotensive with blood pressure 80/49. Patient's blood pressure improved after fluid resuscitation in the emergency department. Oxygen saturation was 99%on RAwith WBC 9.6. Lactic acid was 2.19 at the time of presentation.The patient was initially started on ceftriaxone and azithromycin. Respiratory viral panel returned showing RSV. His antibiotics were de-escalated. The patient had brief episodes of SVT during which he was asymptomatic. This was likely attributable to the patient's acute medical illness and COPD. The patient was started on low-dose metoprolol tartrate. Pt admitted for further management.  Today, pt still noted to be coughing with whitish sputum, denies any chest pain, worsening SOB, abdominal pain, N/V/D/C, fever/chills.  Assessment/Plan: Principal Problem:   Lobar pneumonia ([place]) Active Problems:   Hypertension   Sepsis ([place])   Hypokalemia   Hyponatremia   Hypotension   Tobacco abuse   AKI (acute kidney injury) ([place])  #SIRS/Hypotension Resolved Afebrile, no leukocytosis, sat well on RA -CXR showed small opacity at L base, could represent PNA -Urinalysis negative for pyuria -blood cultures neg -Lactic acid peaked at 2.19>>>0.9  -pro calcitonin 0.26>>>less than <0.10 -Influenza PCR negative -Viral respiratory panel--positive for RSV -d/c ceftriaxone; last  dose (3 days) azithromycin on 12/31 am  #LLLopacity, ??Viral pneumonia in a smoker Viral resp panel positive for RSV Work up and management as above  #Supraventricular tachycardia None >24H -pt had 2 brief asymptomatic episodes, last 07/07/17 -likely due to acute illness and COPD -started low dose metoprolol tartrate -Echo pending -TSH/Free T4--consistent with euthyroid sick illness-->repeat in one month once medically stable  #AKI Resolved -Baseline creatinine 0.7-0.8 -Presenting creatinine 1.55 -Due to volume depletion -BMP  #Hypokalemia/Hypomagnesemia -Replace prn  #Hyponatremia -due to volume depletion -S/p IVF  #Essential hypertension -Holding HCTZ and amlodipine due to hypotension/AKI/Soft BP -Started low dose metoprolol in setting of SVT  #Tobacco abuse -Tobacco cessation advised   Code Status: Full  Family Communication: None at bedside  Disposition Plan: Home once stable, likely 07/09/17   Consultants:  None  Procedures:  None  Antimicrobials:  PO Azithromycin  DVT prophylaxis:  Lovenox   Objective: Vitals:   07/07/17 2113 07/08/17 0540 07/08/17 0600 07/08/17 1423  BP: (!) 107/56 (!) 110/55  (!) 101/51  Pulse: 64 72  (!) 50  Resp: 18 18  18   Temp: 98.5 F (36.9 C) 98.7 F (37.1 C)  98.7 F (37.1 C)  TempSrc: Oral Oral  Oral  SpO2: 100% 100%  100%  Weight:      Height:   6\' 3"  (1.905 m)     Intake/Output Summary (Last 24 hours) at 07/08/2017 1640 Last data filed at 07/08/2017 9562 Gross per 24 hour  Intake 1987.67 ml  Output 2700 ml  Net -712.33 ml   Filed Weights   07/06/17 0105  Weight: 73.2 kg (161 lb 4.8 oz)    Exam:   General: Alert, awake, oriented x3, not in acute distress  Cardiovascular: S1-S2 present, no added heart sounds  Respiratory: Diminished breath sounds at the  bases  Abdomen: Soft, nontender, nondistended, bowel sounds present  Musculoskeletal: No pedal edema bilaterally  Skin:  Normal  Psychiatry: Normal mood   Data Reviewed: CBC: Recent Labs  Lab 07/05/17 2004 07/07/17 0343  WBC 9.6 6.9  NEUTROABS 5.9  --   HGB 12.5* 10.5*  HCT 35.4* 30.5*  MCV 87.8 88.9  PLT 370 539   Basic Metabolic Panel: Recent Labs  Lab 07/05/17 2004 07/06/17 0407 07/07/17 0343 07/07/17 0743 07/08/17 0232  NA 129*  --  132*  --  132*  K 2.4*  --  3.3*  --  4.0  CL 92*  --  107  --  105  CO2 21*  --  19*  --  20*  GLUCOSE 226*  --  109*  --  99  BUN 39*  --  8  --  5*  CREATININE 1.55* 1.74* 0.61  --  0.64  CALCIUM 8.8*  --  8.1*  --  8.3*  MG  --  2.3  --  1.2* 1.6*   GFR: Estimated Creatinine Clearance: 90.2 mL/min (by C-G formula based on SCr of 0.64 mg/dL). Liver Function Tests: Recent Labs  Lab 07/05/17 2004  AST 40  ALT 20  ALKPHOS 68  BILITOT 1.5*  PROT 8.7*  ALBUMIN 3.2*   No results for input(s): LIPASE, AMYLASE in the last 168 hours. No results for input(s): AMMONIA in the last 168 hours. Coagulation Profile: Recent Labs  Lab 07/06/17 0407  INR 1.11   Cardiac Enzymes: No results for input(s): CKTOTAL, CKMB, CKMBINDEX, TROPONINI in the last 168 hours. BNP (last 3 results) No results for input(s): PROBNP in the last 8760 hours. HbA1C: No results for input(s): HGBA1C in the last 72 hours. CBG: No results for input(s): GLUCAP in the last 168 hours. Lipid Profile: No results for input(s): CHOL, HDL, LDLCALC, TRIG, CHOLHDL, LDLDIRECT in the last 72 hours. Thyroid Function Tests: Recent Labs    07/07/17 0730  TSH 1.325  FREET4 1.51*   Anemia Panel: No results for input(s): VITAMINB12, FOLATE, FERRITIN, TIBC, IRON, RETICCTPCT in the last 72 hours. Urine analysis:    Component Value Date/Time   COLORURINE YELLOW 07/06/2017 0420   APPEARANCEUR CLEAR 07/06/2017 0420   LABSPEC 1.009 07/06/2017 0420   PHURINE 7.0 07/06/2017 0420   GLUCOSEU NEGATIVE 07/06/2017 0420   HGBUR NEGATIVE 07/06/2017 0420   BILIRUBINUR NEGATIVE 07/06/2017 0420    KETONESUR NEGATIVE 07/06/2017 0420   PROTEINUR NEGATIVE 07/06/2017 0420   UROBILINOGEN 2.0 (H) 03/07/2011 1044   NITRITE NEGATIVE 07/06/2017 0420   LEUKOCYTESUR NEGATIVE 07/06/2017 0420   Sepsis Labs: @LABRCNTIP (procalcitonin:4,lacticidven:4)  ) Recent Results (from the past 240 hour(s))  Blood Culture (routine x 2)     Status: None (Preliminary result)   Collection Time: 07/06/17  1:20 AM  Result Value Ref Range Status   Specimen Description BLOOD RIGHT FOREARM  Final   Special Requests   Final    BOTTLES DRAWN AEROBIC AND ANAEROBIC Blood Culture adequate volume   Culture NO GROWTH 2 DAYS  Final   Report Status PENDING  Incomplete  Blood Culture (routine x 2)     Status: None (Preliminary result)   Collection Time: 07/06/17  1:27 AM  Result Value Ref Range Status   Specimen Description BLOOD LEFT ANTECUBITAL  Final   Special Requests   Final    BOTTLES DRAWN AEROBIC AND ANAEROBIC Blood Culture adequate volume   Culture NO GROWTH 2 DAYS  Final   Report Status PENDING  Incomplete  Respiratory Panel by PCR     Status: Abnormal   Collection Time: 07/06/17  3:25 AM  Result Value Ref Range Status   Adenovirus NOT DETECTED NOT DETECTED Final   Coronavirus 229E NOT DETECTED NOT DETECTED Final   Coronavirus HKU1 NOT DETECTED NOT DETECTED Final   Coronavirus NL63 NOT DETECTED NOT DETECTED Final   Coronavirus OC43 NOT DETECTED NOT DETECTED Final   Metapneumovirus NOT DETECTED NOT DETECTED Final   Rhinovirus / Enterovirus NOT DETECTED NOT DETECTED Final   Influenza A NOT DETECTED NOT DETECTED Final   Influenza B NOT DETECTED NOT DETECTED Final   Parainfluenza Virus 1 NOT DETECTED NOT DETECTED Final   Parainfluenza Virus 2 NOT DETECTED NOT DETECTED Final   Parainfluenza Virus 3 NOT DETECTED NOT DETECTED Final   Parainfluenza Virus 4 NOT DETECTED NOT DETECTED Final   Respiratory Syncytial Virus DETECTED (A) NOT DETECTED Final    Comment: CRITICAL RESULT CALLED TO, READ BACK BY AND  VERIFIED WITH: B MILLER,RN AT 8828 07/06/17 BY L BENFIELD    Bordetella pertussis NOT DETECTED NOT DETECTED Final   Chlamydophila pneumoniae NOT DETECTED NOT DETECTED Final   Mycoplasma pneumoniae NOT DETECTED NOT DETECTED Final  Urine culture     Status: None   Collection Time: 07/06/17  4:20 AM  Result Value Ref Range Status   Specimen Description URINE, CLEAN CATCH  Final   Special Requests NONE  Final   Culture NO GROWTH  Final   Report Status 07/07/2017 FINAL  Final      Studies: No results found.  Scheduled Meds: . enoxaparin (LOVENOX) injection  40 mg Subcutaneous Q24H  . metoprolol tartrate  12.5 mg Oral BID  . nicotine  21 mg Transdermal Daily  . tiZANidine  4 mg Oral Daily  . zolpidem  5 mg Oral QHS    Continuous Infusions: . 0.9 % NaCl with KCl 40 mEq / L 100 mL/hr (07/08/17 0501)     LOS: 2 days     Alma Friendly, MD [facility]   If 7PM-7AM, please contact night-coverage [email or web address] Password [email or web address] 07/08/2017, 4:40 PM

## 2017-07-08 NOTE — Care Management CC44 (Signed)
Condition Code 44 Documentation Completed  Patient Details  Name: AAYANSH CODISPOTI MRN: [id number] Date of Birth: October 26, 1945   Condition Code 44 given:  Yes Patient signature on Condition Code 44 notice:  Yes Documentation of 2 MD's agreement:    Code 44 added to claim:  Yes    Pollie Friar, RN 07/08/2017, 4:03 PM

## 2017-07-09 DIAGNOSIS — E871 Hypo-osmolality and hyponatremia: Secondary | ICD-10-CM

## 2017-07-09 DIAGNOSIS — I10 Essential (primary) hypertension: Secondary | ICD-10-CM | POA: Diagnosis not present

## 2017-07-09 DIAGNOSIS — E876 Hypokalemia: Secondary | ICD-10-CM | POA: Diagnosis not present

## 2017-07-09 DIAGNOSIS — N179 Acute kidney failure, unspecified: Secondary | ICD-10-CM | POA: Diagnosis not present

## 2017-07-09 DIAGNOSIS — J181 Lobar pneumonia, unspecified organism: Secondary | ICD-10-CM | POA: Diagnosis not present

## 2017-07-09 LAB — CBC WITH DIFFERENTIAL/PLATELET
BASOS PCT: 0 %
Basophils Absolute: 0 10*3/uL (ref 0.0–0.1)
EOS ABS: 0 10*3/uL (ref 0.0–0.7)
EOS PCT: 1 %
HCT: 33.5 % — ABNORMAL LOW (ref 39.0–52.0)
Hemoglobin: 11.5 g/dL — ABNORMAL LOW (ref 13.0–17.0)
LYMPHS ABS: 1.6 10*3/uL (ref 0.7–4.0)
Lymphocytes Relative: 29 %
MCH: 31.1 pg (ref 26.0–34.0)
MCHC: 34.3 g/dL (ref 30.0–36.0)
MCV: 90.5 fL (ref 78.0–100.0)
Monocytes Absolute: 0.8 10*3/uL (ref 0.1–1.0)
Monocytes Relative: 14 %
NEUTROS PCT: 56 %
Neutro Abs: 3.2 10*3/uL (ref 1.7–7.7)
PLATELETS: 393 10*3/uL (ref 150–400)
RBC: 3.7 MIL/uL — AB (ref 4.22–5.81)
RDW: 12.6 % (ref 11.5–15.5)
WBC: 5.7 10*3/uL (ref 4.0–10.5)

## 2017-07-09 LAB — BASIC METABOLIC PANEL
Anion gap: 9 (ref 5–15)
BUN: 5 mg/dL — AB (ref 6–20)
CO2: 17 mmol/L — ABNORMAL LOW (ref 22–32)
CREATININE: 0.58 mg/dL — AB (ref 0.61–1.24)
Calcium: 8.8 mg/dL — ABNORMAL LOW (ref 8.9–10.3)
Chloride: 105 mmol/L (ref 101–111)
Glucose, Bld: 91 mg/dL (ref 65–99)
POTASSIUM: 4.2 mmol/L (ref 3.5–5.1)
SODIUM: 131 mmol/L — AB (ref 135–145)

## 2017-07-09 LAB — MAGNESIUM: MAGNESIUM: 1.2 mg/dL — AB (ref 1.7–2.4)

## 2017-07-09 MED ORDER — NICOTINE 21 MG/24HR TD PT24: 21.0000 mg | MEDICATED_PATCH | Freq: Every day | TRANSDERMAL | 0 refills | Status: DC

## 2017-07-09 MED ORDER — METOPROLOL TARTRATE 25 MG PO TABS: 12.5000 mg | ORAL_TABLET | Freq: Two times a day (BID) | ORAL | 0 refills | Status: DC

## 2017-07-09 MED ORDER — MAGNESIUM SULFATE 4 GM/100ML IV SOLN
4.0000 g | Freq: Once | INTRAVENOUS | Status: AC
  Administered 2017-07-09: 4 g via INTRAVENOUS
  Filled 2017-07-09: qty 100

## 2017-07-09 MED ORDER — AMLODIPINE BESYLATE 10 MG PO TABS
10.0000 mg | ORAL_TABLET | Freq: Every day | ORAL | Status: DC
  Administered 2017-07-09: 10 mg via ORAL
  Filled 2017-07-09: qty 1

## 2017-07-09 NOTE — Progress Notes (Signed)
Pt given discharge instructions, prescriptions, and care notes. Pt verbalized understanding AEB no further questions or concerns at this time. IV was discontinued, no redness, pain, or swelling noted at this time. Telemetry discontinued and Centralized Telemetry was notified. Pt to leave the floor via wheelchair with staff in stable condition. 

## 2017-07-09 NOTE — Discharge Summary (Signed)
Discharge Summary  Sean Farrell:[id number] DOB: [birth year]  PCP: Nolene Ebbs, MD  Admit date: 07/06/2017 Discharge date: 07/09/2017  Time spent: <30 mins   Recommendations for Outpatient Follow-up:  1. PCP 2. Cardiology   Discharge Diagnoses:  Active Hospital Problems   Diagnosis Date Noted  . Lobar pneumonia ([place]) 07/06/2017  . Sepsis ([place]) 07/06/2017  . Hypokalemia 07/06/2017  . Hyponatremia 07/06/2017  . Hypotension 07/06/2017  . Hypertension   . Tobacco abuse   . AKI (acute kidney injury) [facility])     Resolved Hospital Problems  No resolved problems to display.    Discharge Condition: Stable  Diet recommendation: Heart healthy  Vitals:   07/09/17 0528 07/09/17 1348  BP: (!) 156/83 112/71  Pulse: (!) 103 (!) 57  Resp:  18  Temp:  98.3 F (36.8 C)  SpO2: 96% 100%    History of present illness:  72 year old male with a history of hypertension and tobacco abuse presenting with 1 week history of generalized weakness, dyspnea on exertion and one episode of nausea and vomiting. He complains of a cough with yellow sputum production. Unfortunately, the patient continues to smoke 1 pack/day. He denies any fevers, chills, chest pain, hemoptysis, diarrhea, hematochezia, melena. He denies any recent sick contacts. Upon presentation, the patient was afebrile, but he was hypotensive with blood pressure 80/49. Patient's blood pressure improved after fluid resuscitation in the emergency department. Oxygen saturation was 99%on RAwith WBC 9.6. Lactic acid was 2.19 at the time of presentation.The patient was initially started on ceftriaxone and azithromycin. Respiratory viral panel returned showing RSV. His antibiotics were de-escalated. The patient had brief episodes of SVT during which he was asymptomatic. This was likely attributable to the patient's acute medical illness and COPD. The patient was started on low-dose metoprolol tartrate.Pt admitted for further  management.  Today, pt reported improvement overall, mild coughing with whitish sputum, denies any chest pain, worsening SOB, abdominal pain, N/V/D/C, fever/chills.  Hospital Course:  Principal Problem:   Lobar pneumonia ([place]) Active Problems:   Hypertension   Sepsis ([place])   Hypokalemia   Hyponatremia   Hypotension   Tobacco abuse   AKI (acute kidney injury) ([place])  #SIRS/Hypotension Resolved Afebrile, no leukocytosis, sat well on RA -CXR showed small opacity at L base, could represent PNA -Urinalysis negative for pyuria -blood cultures neg -Lactic acid peaked at 2.19>>>0.9  -pro calcitonin 0.26>>>less than <0.10 -Influenza PCR negative -Viral respiratory panel--positive for RSV -d/c ceftriaxone; completed 3 days of azithromycin  #LLLopacity, ??Viral pneumonia in a smoker Viral resp panel positive for RSV Work up and management as above  #Supraventricular tachycardia None >48H -pt had 2 brief asymptomatic episodes, last episode on 07/07/17 -likely due to acute illness and COPD -started low dose metoprolol tartrate -Echo showed: LVEF of 40% to 45%, Grade 1 DD. Wall motion was normal, diffuse hypokinesis. No previous to compare -TSH/Free T4--consistent with euthyroid sick illness-->repeat in one month once medically stable  #Combined systolic/diastolic hrt failure Euvolemic, compensated ECHO as above Pt advised to stay one more night to be evaluated by cardiology for possible stress test/ optimization of therapy. Patient refused. Advised patient to follow up closely with PCP for a possible referral to outpatient cardiology. Pt verbalized understanding PCP to refer pt to outside cardiologist for further management Started pt on low dose lopressor, consider ACE-I pending BP  #AKI Resolved -Baseline creatinine 0.7-0.8 -Presenting creatinine 1.55 -Due to volume depletion -BMP  #Hypokalemia/Hypomagnesemia -Replace prn  #Hyponatremia -due to volume  depletion -S/p  IVF  #Essential hypertension -Holding HCTZ due to hypotension/AKI/Soft BP -Started low dose metoprolol in setting of SVT, can start ACE-I pending BB  #Tobacco abuse -Tobacco cessation advised     Procedures:  None  Consultations:  None  Discharge Exam: BP 112/71 (BP Location: Right Arm)   Pulse (!) 57   Temp 98.3 F (36.8 C) (Oral)   Resp 18   Ht 6\' 3"  (1.905 m)   Wt 73.2 kg (161 lb 4.8 oz)   SpO2 100%   BMI 20.16 kg/m   General: Alert, awake, oriented X 3 Cardiovascular: S1-S2 present, no added hrt sound Respiratory: Chest clear bilaterally  Discharge Instructions You were cared for by a hospitalist during your hospital stay. If you have any questions about your discharge medications or the care you received while you were in the hospital after you are discharged, you can call the unit and asked to speak with the hospitalist on call if the hospitalist that took care of you is not available. Once you are discharged, your primary care physician will handle any further medical issues. Please note that NO REFILLS for any discharge medications will be authorized once you are discharged, as it is imperative that you return to your primary care physician (or establish a relationship with a primary care physician if you do not have one) for your aftercare needs so that they can reassess your need for medications and monitor your lab values.  Discharge Instructions    Diet - low sodium heart healthy   Complete by:  As directed    Increase activity slowly   Complete by:  As directed      Allergies as of 07/09/2017   No Known Allergies     Medication List    STOP taking these medications   hydrochlorothiazide 25 MG tablet Commonly known as:  HYDRODIURIL     TAKE these medications   amLODipine 10 MG tablet Commonly known as:  NORVASC Take 10 mg by mouth daily.   metoprolol tartrate 25 MG tablet Commonly known as:  LOPRESSOR Take 0.5 tablets (12.5  mg total) by mouth 2 (two) times daily.   nicotine 21 mg/24hr patch Commonly known as:  NICODERM CQ - dosed in mg/24 hours Place 1 patch (21 mg total) onto the skin daily. Start taking on:  07/10/2017   tiZANidine 4 MG tablet Commonly known as:  ZANAFLEX Take 4 mg by mouth daily.   zolpidem 5 MG tablet Commonly known as:  AMBIEN Take 5 mg by mouth at bedtime.      No Known Allergies Follow-up Information    Nolene Ebbs, MD. Schedule an appointment as soon as possible for a visit in 1 week(s).   Specialty:  Internal Medicine Contact information: [address] [place] [address] [phone]            The results of significant diagnostics from this hospitalization (including imaging, microbiology, ancillary and laboratory) are listed below for reference.    Significant Diagnostic Studies: Dg Chest 2 View  Result Date: 07/05/2017 CLINICAL DATA:  Fatigue and anorexia EXAM: CHEST  2 VIEW COMPARISON:  03/06/2011 FINDINGS: Right lung is grossly clear. Minimal scarring left base. Small focal opacity at the left base. Normal heart size. No pneumothorax. Scarring in the right upper lobe. IMPRESSION: Small opacity at the left base could reflect a mild infiltrate. Radiographic follow-up to resolution recommended. Electronically Signed   By: Donavan Foil M.D.   On: 07/05/2017 20:39    Microbiology: Recent  Results (from the past 240 hour(s))  Blood Culture (routine x 2)     Status: None (Preliminary result)   Collection Time: 07/06/17  1:20 AM  Result Value Ref Range Status   Specimen Description BLOOD RIGHT FOREARM  Final   Special Requests   Final    BOTTLES DRAWN AEROBIC AND ANAEROBIC Blood Culture adequate volume   Culture NO GROWTH 3 DAYS  Final   Report Status PENDING  Incomplete  Blood Culture (routine x 2)     Status: None (Preliminary result)   Collection Time: 07/06/17  1:27 AM  Result Value Ref Range Status   Specimen Description BLOOD LEFT ANTECUBITAL   Final   Special Requests   Final    BOTTLES DRAWN AEROBIC AND ANAEROBIC Blood Culture adequate volume   Culture NO GROWTH 3 DAYS  Final   Report Status PENDING  Incomplete  Respiratory Panel by PCR     Status: Abnormal   Collection Time: 07/06/17  3:25 AM  Result Value Ref Range Status   Adenovirus NOT DETECTED NOT DETECTED Final   Coronavirus 229E NOT DETECTED NOT DETECTED Final   Coronavirus HKU1 NOT DETECTED NOT DETECTED Final   Coronavirus NL63 NOT DETECTED NOT DETECTED Final   Coronavirus OC43 NOT DETECTED NOT DETECTED Final   Metapneumovirus NOT DETECTED NOT DETECTED Final   Rhinovirus / Enterovirus NOT DETECTED NOT DETECTED Final   Influenza A NOT DETECTED NOT DETECTED Final   Influenza B NOT DETECTED NOT DETECTED Final   Parainfluenza Virus 1 NOT DETECTED NOT DETECTED Final   Parainfluenza Virus 2 NOT DETECTED NOT DETECTED Final   Parainfluenza Virus 3 NOT DETECTED NOT DETECTED Final   Parainfluenza Virus 4 NOT DETECTED NOT DETECTED Final   Respiratory Syncytial Virus DETECTED (A) NOT DETECTED Final    Comment: CRITICAL RESULT CALLED TO, READ BACK BY AND VERIFIED WITH: B MILLER,RN AT 0948 07/06/17 BY L BENFIELD    Bordetella pertussis NOT DETECTED NOT DETECTED Final   Chlamydophila pneumoniae NOT DETECTED NOT DETECTED Final   Mycoplasma pneumoniae NOT DETECTED NOT DETECTED Final  Urine culture     Status: None   Collection Time: 07/06/17  4:20 AM  Result Value Ref Range Status   Specimen Description URINE, CLEAN CATCH  Final   Special Requests NONE  Final   Culture NO GROWTH  Final   Report Status 07/07/2017 FINAL  Final     Labs: Basic Metabolic Panel: Recent Labs  Lab 07/05/17 2004 07/06/17 0407 07/07/17 0343 07/07/17 0743 07/08/17 0232 07/09/17 0333  NA 129*  --  132*  --  132* 131*  K 2.4*  --  3.3*  --  4.0 4.2  CL 92*  --  107  --  105 105  CO2 21*  --  19*  --  20* 17*  GLUCOSE 226*  --  109*  --  99 91  BUN 39*  --  8  --  5* 5*  CREATININE 1.55*  1.74* 0.61  --  0.64 0.58*  CALCIUM 8.8*  --  8.1*  --  8.3* 8.8*  MG  --  2.3  --  1.2* 1.6* 1.2*   Liver Function Tests: Recent Labs  Lab 07/05/17 2004  AST 40  ALT 20  ALKPHOS 68  BILITOT 1.5*  PROT 8.7*  ALBUMIN 3.2*   No results for input(s): LIPASE, AMYLASE in the last 168 hours. No results for input(s): AMMONIA in the last 168 hours. CBC: Recent Labs  Lab 07/05/17 2004  07/07/17 0343 07/09/17 0333  WBC 9.6 6.9 5.7  NEUTROABS 5.9  --  3.2  HGB 12.5* 10.5* 11.5*  HCT 35.4* 30.5* 33.5*  MCV 87.8 88.9 90.5  PLT 370 321 393   Cardiac Enzymes: No results for input(s): CKTOTAL, CKMB, CKMBINDEX, TROPONINI in the last 168 hours. BNP: BNP (last 3 results) No results for input(s): BNP in the last 8760 hours.  ProBNP (last 3 results) No results for input(s): PROBNP in the last 8760 hours.  CBG: No results for input(s): GLUCAP in the last 168 hours.     Signed:  Alma Friendly, MD [facility] 07/09/2017, 4:08 PM

## 2017-07-11 LAB — CULTURE, BLOOD (ROUTINE X 2)
CULTURE: NO GROWTH
Culture: NO GROWTH
SPECIAL REQUESTS: ADEQUATE
SPECIAL REQUESTS: ADEQUATE

## 2020-05-23 ENCOUNTER — Ambulatory Visit: Payer: Medicare Other | Attending: Internal Medicine

## 2020-05-23 DIAGNOSIS — Z23 Encounter for immunization: Secondary | ICD-10-CM

## 2020-05-23 NOTE — Progress Notes (Signed)
   Covid-19 Vaccination Clinic  Name:  Sean Farrell    MRN: [id number] DOB: March 17, 1946  05/23/2020  Mr. Sean Farrell was observed post Covid-19 immunization for 15 minutes without incident. He was provided with Vaccine Information Sheet and instruction to access the V-Safe system.   Sean Farrell was instructed to call 911 with any severe reactions post vaccine: Marland Kitchen Difficulty breathing  . Swelling of face and throat  . A fast heartbeat  . A bad rash all over body  . Dizziness and weakness   Immunizations Administered    Name Date Dose VIS Date Route   Pfizer COVID-19 Vaccine 05/23/2020  1:34 PM 0.3 mL 04/27/2020 Intramuscular   Manufacturer: [place]   Lot: [id number]   [place]: [id number]

## 2021-02-20 ENCOUNTER — Other Ambulatory Visit: Payer: Self-pay | Admitting: Internal Medicine

## 2021-02-21 LAB — SARS-COV-2 RNA,(COVID-19) QUALITATIVE NAAT: SARS CoV2 RNA: NOT DETECTED

## 2021-11-10 ENCOUNTER — Encounter (HOSPITAL_COMMUNITY): Payer: Self-pay | Admitting: Emergency Medicine

## 2021-11-10 ENCOUNTER — Inpatient Hospital Stay (HOSPITAL_COMMUNITY): Payer: Medicare Other

## 2021-11-10 ENCOUNTER — Inpatient Hospital Stay (HOSPITAL_COMMUNITY)
Admission: EM | Admit: 2021-11-10 | Discharge: 2021-11-25 | DRG: 193 | Disposition: A | Discharge status: Hospice | Payer: Medicare Other | Attending: Internal Medicine | Admitting: Internal Medicine

## 2021-11-10 ENCOUNTER — Emergency Department (HOSPITAL_COMMUNITY): Payer: Medicare Other

## 2021-11-10 ENCOUNTER — Other Ambulatory Visit: Payer: Self-pay

## 2021-11-10 DIAGNOSIS — E871 Hypo-osmolality and hyponatremia: Secondary | ICD-10-CM | POA: Diagnosis present

## 2021-11-10 DIAGNOSIS — I951 Orthostatic hypotension: Secondary | ICD-10-CM | POA: Diagnosis not present

## 2021-11-10 DIAGNOSIS — Z23 Encounter for immunization: Secondary | ICD-10-CM

## 2021-11-10 DIAGNOSIS — I679 Cerebrovascular disease, unspecified: Secondary | ICD-10-CM | POA: Diagnosis not present

## 2021-11-10 DIAGNOSIS — D72823 Leukemoid reaction: Secondary | ICD-10-CM | POA: Diagnosis present

## 2021-11-10 DIAGNOSIS — Z7189 Other specified counseling: Secondary | ICD-10-CM | POA: Diagnosis not present

## 2021-11-10 DIAGNOSIS — Z716 Tobacco abuse counseling: Secondary | ICD-10-CM

## 2021-11-10 DIAGNOSIS — D539 Nutritional anemia, unspecified: Secondary | ICD-10-CM | POA: Diagnosis present

## 2021-11-10 DIAGNOSIS — E43 Unspecified severe protein-calorie malnutrition: Secondary | ICD-10-CM | POA: Diagnosis present

## 2021-11-10 DIAGNOSIS — I493 Ventricular premature depolarization: Secondary | ICD-10-CM | POA: Diagnosis present

## 2021-11-10 DIAGNOSIS — E860 Dehydration: Secondary | ICD-10-CM | POA: Diagnosis present

## 2021-11-10 DIAGNOSIS — R000 Tachycardia, unspecified: Secondary | ICD-10-CM

## 2021-11-10 DIAGNOSIS — K769 Liver disease, unspecified: Secondary | ICD-10-CM

## 2021-11-10 DIAGNOSIS — I110 Hypertensive heart disease with heart failure: Secondary | ICD-10-CM | POA: Diagnosis present

## 2021-11-10 DIAGNOSIS — F1210 Cannabis abuse, uncomplicated: Secondary | ICD-10-CM | POA: Diagnosis present

## 2021-11-10 DIAGNOSIS — D72829 Elevated white blood cell count, unspecified: Secondary | ICD-10-CM | POA: Diagnosis not present

## 2021-11-10 DIAGNOSIS — C259 Malignant neoplasm of pancreas, unspecified: Secondary | ICD-10-CM | POA: Diagnosis present

## 2021-11-10 DIAGNOSIS — A419 Sepsis, unspecified organism: Secondary | ICD-10-CM | POA: Diagnosis not present

## 2021-11-10 DIAGNOSIS — Z8614 Personal history of Methicillin resistant Staphylococcus aureus infection: Secondary | ICD-10-CM

## 2021-11-10 DIAGNOSIS — B1920 Unspecified viral hepatitis C without hepatic coma: Secondary | ICD-10-CM | POA: Diagnosis present

## 2021-11-10 DIAGNOSIS — Z8611 Personal history of tuberculosis: Secondary | ICD-10-CM | POA: Diagnosis not present

## 2021-11-10 DIAGNOSIS — Z515 Encounter for palliative care: Secondary | ICD-10-CM | POA: Diagnosis not present

## 2021-11-10 DIAGNOSIS — C801 Malignant (primary) neoplasm, unspecified: Secondary | ICD-10-CM | POA: Diagnosis not present

## 2021-11-10 DIAGNOSIS — E872 Acidosis, unspecified: Secondary | ICD-10-CM | POA: Diagnosis present

## 2021-11-10 DIAGNOSIS — Z720 Tobacco use: Secondary | ICD-10-CM

## 2021-11-10 DIAGNOSIS — J181 Lobar pneumonia, unspecified organism: Secondary | ICD-10-CM | POA: Diagnosis not present

## 2021-11-10 DIAGNOSIS — Z20822 Contact with and (suspected) exposure to covid-19: Secondary | ICD-10-CM | POA: Diagnosis present

## 2021-11-10 DIAGNOSIS — D649 Anemia, unspecified: Secondary | ICD-10-CM

## 2021-11-10 DIAGNOSIS — C787 Secondary malignant neoplasm of liver and intrahepatic bile duct: Secondary | ICD-10-CM | POA: Diagnosis present

## 2021-11-10 DIAGNOSIS — E878 Other disorders of electrolyte and fluid balance, not elsewhere classified: Secondary | ICD-10-CM | POA: Diagnosis present

## 2021-11-10 DIAGNOSIS — R918 Other nonspecific abnormal finding of lung field: Secondary | ICD-10-CM | POA: Diagnosis present

## 2021-11-10 DIAGNOSIS — M899 Disorder of bone, unspecified: Secondary | ICD-10-CM | POA: Diagnosis not present

## 2021-11-10 DIAGNOSIS — E440 Moderate protein-calorie malnutrition: Secondary | ICD-10-CM | POA: Diagnosis not present

## 2021-11-10 DIAGNOSIS — I5042 Chronic combined systolic (congestive) and diastolic (congestive) heart failure: Secondary | ICD-10-CM | POA: Diagnosis present

## 2021-11-10 DIAGNOSIS — D75839 Thrombocytosis, unspecified: Secondary | ICD-10-CM | POA: Diagnosis present

## 2021-11-10 DIAGNOSIS — Z8673 Personal history of transient ischemic attack (TIA), and cerebral infarction without residual deficits: Secondary | ICD-10-CM

## 2021-11-10 DIAGNOSIS — F17210 Nicotine dependence, cigarettes, uncomplicated: Secondary | ICD-10-CM | POA: Diagnosis present

## 2021-11-10 DIAGNOSIS — A403 Sepsis due to Streptococcus pneumoniae: Secondary | ICD-10-CM | POA: Diagnosis not present

## 2021-11-10 DIAGNOSIS — J90 Pleural effusion, not elsewhere classified: Secondary | ICD-10-CM

## 2021-11-10 DIAGNOSIS — Z7151 Drug abuse counseling and surveillance of drug abuser: Secondary | ICD-10-CM

## 2021-11-10 DIAGNOSIS — J188 Other pneumonia, unspecified organism: Secondary | ICD-10-CM | POA: Diagnosis present

## 2021-11-10 DIAGNOSIS — Z6821 Body mass index (BMI) 21.0-21.9, adult: Secondary | ICD-10-CM

## 2021-11-10 DIAGNOSIS — N179 Acute kidney failure, unspecified: Secondary | ICD-10-CM

## 2021-11-10 DIAGNOSIS — C7951 Secondary malignant neoplasm of bone: Secondary | ICD-10-CM | POA: Diagnosis present

## 2021-11-10 DIAGNOSIS — I10 Essential (primary) hypertension: Secondary | ICD-10-CM | POA: Diagnosis present

## 2021-11-10 DIAGNOSIS — Z66 Do not resuscitate: Secondary | ICD-10-CM | POA: Diagnosis present

## 2021-11-10 DIAGNOSIS — R7401 Elevation of levels of liver transaminase levels: Secondary | ICD-10-CM

## 2021-11-10 DIAGNOSIS — J189 Pneumonia, unspecified organism: Secondary | ICD-10-CM

## 2021-11-10 DIAGNOSIS — Z79899 Other long term (current) drug therapy: Secondary | ICD-10-CM

## 2021-11-10 DIAGNOSIS — I428 Other cardiomyopathies: Secondary | ICD-10-CM | POA: Diagnosis not present

## 2021-11-10 DIAGNOSIS — E8809 Other disorders of plasma-protein metabolism, not elsewhere classified: Secondary | ICD-10-CM | POA: Diagnosis present

## 2021-11-10 DIAGNOSIS — E876 Hypokalemia: Secondary | ICD-10-CM | POA: Diagnosis present

## 2021-11-10 DIAGNOSIS — I959 Hypotension, unspecified: Secondary | ICD-10-CM | POA: Diagnosis present

## 2021-11-10 HISTORY — DX: Cannabis abuse, uncomplicated: F12.10

## 2021-11-10 LAB — CBC
HCT: 34.1 % — ABNORMAL LOW (ref 39.0–52.0)
Hemoglobin: 10.6 g/dL — ABNORMAL LOW (ref 13.0–17.0)
MCH: 29.2 pg (ref 26.0–34.0)
MCHC: 31.1 g/dL (ref 30.0–36.0)
MCV: 93.9 fL (ref 80.0–100.0)
Platelets: 538 10*3/uL — ABNORMAL HIGH (ref 150–400)
RBC: 3.63 MIL/uL — ABNORMAL LOW (ref 4.22–5.81)
RDW: 13.8 % (ref 11.5–15.5)
WBC: 36.7 10*3/uL — ABNORMAL HIGH (ref 4.0–10.5)
nRBC: 0 % (ref 0.0–0.2)

## 2021-11-10 LAB — BODY FLUID CELL COUNT WITH DIFFERENTIAL
Eos, Fluid: 0 %
Lymphs, Fluid: 66 %
Monocyte-Macrophage-Serous Fluid: 10 % — ABNORMAL LOW (ref 50–90)
Neutrophil Count, Fluid: 24 % (ref 0–25)
Total Nucleated Cell Count, Fluid: 1012 cu mm — ABNORMAL HIGH (ref 0–1000)

## 2021-11-10 LAB — LACTIC ACID, PLASMA
Lactic Acid, Venous: 1.8 mmol/L (ref 0.5–1.9)
Lactic Acid, Venous: 2.4 mmol/L (ref 0.5–1.9)

## 2021-11-10 LAB — LACTATE DEHYDROGENASE, PLEURAL OR PERITONEAL FLUID: LD, Fluid: 127 U/L — ABNORMAL HIGH (ref 3–23)

## 2021-11-10 LAB — URINALYSIS, ROUTINE W REFLEX MICROSCOPIC
Bilirubin Urine: NEGATIVE
Glucose, UA: NEGATIVE mg/dL
Hgb urine dipstick: NEGATIVE
Ketones, ur: NEGATIVE mg/dL
Leukocytes,Ua: NEGATIVE
Nitrite: NEGATIVE
Protein, ur: NEGATIVE mg/dL
Specific Gravity, Urine: 1.017 (ref 1.005–1.030)
pH: 6 (ref 5.0–8.0)

## 2021-11-10 LAB — MRSA NEXT GEN BY PCR, NASAL: MRSA by PCR Next Gen: NOT DETECTED

## 2021-11-10 LAB — RESP PANEL BY RT-PCR (FLU A&B, COVID) ARPGX2
Influenza A by PCR: NEGATIVE
Influenza B by PCR: NEGATIVE
SARS Coronavirus 2 by RT PCR: NEGATIVE

## 2021-11-10 LAB — HEPATIC FUNCTION PANEL
ALT: 16 U/L (ref 0–44)
AST: 40 U/L (ref 15–41)
Albumin: 2.4 g/dL — ABNORMAL LOW (ref 3.5–5.0)
Alkaline Phosphatase: 179 U/L — ABNORMAL HIGH (ref 38–126)
Bilirubin, Direct: 1 mg/dL — ABNORMAL HIGH (ref 0.0–0.2)
Indirect Bilirubin: 0.8 mg/dL (ref 0.3–0.9)
Total Bilirubin: 1.8 mg/dL — ABNORMAL HIGH (ref 0.3–1.2)
Total Protein: 7 g/dL (ref 6.5–8.1)

## 2021-11-10 LAB — GLUCOSE, PLEURAL OR PERITONEAL FLUID: Glucose, Fluid: 99 mg/dL

## 2021-11-10 LAB — BASIC METABOLIC PANEL
Anion gap: 12 (ref 5–15)
BUN: 19 mg/dL (ref 8–23)
CO2: 18 mmol/L — ABNORMAL LOW (ref 22–32)
Calcium: 9.6 mg/dL (ref 8.9–10.3)
Chloride: 105 mmol/L (ref 98–111)
Creatinine, Ser: 0.95 mg/dL (ref 0.61–1.24)
GFR, Estimated: >60 mL/min (ref >60)
Glucose, Bld: 112 mg/dL — ABNORMAL HIGH (ref 70–99)
Potassium: 4.1 mmol/L (ref 3.5–5.1)
Sodium: 135 mmol/L (ref 135–145)

## 2021-11-10 LAB — PROTIME-INR
INR: 1 (ref 0.8–1.2)
Prothrombin Time: 12.7 seconds (ref 11.4–15.2)

## 2021-11-10 LAB — HIV ANTIBODY (ROUTINE TESTING W REFLEX): HIV Screen 4th Generation wRfx: NONREACTIVE

## 2021-11-10 LAB — LACTATE DEHYDROGENASE: LDH: 308 U/L — ABNORMAL HIGH (ref 98–192)

## 2021-11-10 LAB — PROTEIN, PLEURAL OR PERITONEAL FLUID: Total protein, fluid: 4 g/dL

## 2021-11-10 LAB — APTT: aPTT: 35 seconds (ref 24–36)

## 2021-11-10 LAB — PROCALCITONIN: Procalcitonin: 0.64 ng/mL

## 2021-11-10 MED ORDER — VANCOMYCIN HCL 1750 MG/350ML IV SOLN
1750.0000 mg | Freq: Once | INTRAVENOUS | Status: AC
  Administered 2021-11-10: 1750 mg via INTRAVENOUS
  Filled 2021-11-10: qty 350

## 2021-11-10 MED ORDER — ONDANSETRON HCL 4 MG/2ML IJ SOLN: 4.0000 mg | Freq: Four times a day (QID) | INTRAMUSCULAR | Status: DC | PRN

## 2021-11-10 MED ORDER — LACTATED RINGERS IV BOLUS (SEPSIS)
500.0000 mL | Freq: Once | INTRAVENOUS | Status: AC
  Administered 2021-11-10: 500 mL via INTRAVENOUS

## 2021-11-10 MED ORDER — DOCUSATE SODIUM 100 MG PO CAPS
100.0000 mg | ORAL_CAPSULE | Freq: Two times a day (BID) | ORAL | Status: DC
  Administered 2021-11-10 – 2021-11-23 (×21): 100 mg via ORAL
  Filled 2021-11-10 (×26): qty 1

## 2021-11-10 MED ORDER — MORPHINE SULFATE (PF) 2 MG/ML IV SOLN
2.0000 mg | Freq: Once | INTRAVENOUS | Status: AC
Start: 2021-11-10 — End: 2021-11-10
  Administered 2021-11-10: 2 mg via INTRAVENOUS
  Filled 2021-11-10: qty 1

## 2021-11-10 MED ORDER — IOHEXOL 300 MG/ML  SOLN
75.0000 mL | Freq: Once | INTRAMUSCULAR | Status: AC | PRN
  Administered 2021-11-10: 75 mL via INTRAVENOUS

## 2021-11-10 MED ORDER — SODIUM CHLORIDE 0.9 % IV SOLN
2.0000 g | Freq: Three times a day (TID) | INTRAVENOUS | Status: DC
  Administered 2021-11-10 – 2021-11-11 (×3): 2 g via INTRAVENOUS
  Filled 2021-11-10 (×3): qty 12.5

## 2021-11-10 MED ORDER — ALBUTEROL SULFATE (2.5 MG/3ML) 0.083% IN NEBU: 2.5000 mg | INHALATION_SOLUTION | RESPIRATORY_TRACT | Status: DC | PRN

## 2021-11-10 MED ORDER — SODIUM CHLORIDE (PF) 0.9 % IJ SOLN
10.0000 mg | Freq: Once | INTRAMUSCULAR | Status: AC
  Administered 2021-11-10: 10 mg via INTRAPLEURAL
  Filled 2021-11-10: qty 10

## 2021-11-10 MED ORDER — LACTATED RINGERS IV BOLUS (SEPSIS)
1000.0000 mL | Freq: Once | INTRAVENOUS | Status: AC
  Administered 2021-11-10: 1000 mL via INTRAVENOUS

## 2021-11-10 MED ORDER — TIZANIDINE HCL 4 MG PO TABS
4.0000 mg | ORAL_TABLET | Freq: Every evening | ORAL | Status: DC | PRN
  Administered 2021-11-15 – 2021-11-23 (×3): 4 mg via ORAL
  Filled 2021-11-10 (×3): qty 1

## 2021-11-10 MED ORDER — ZOLPIDEM TARTRATE 5 MG PO TABS
5.0000 mg | ORAL_TABLET | Freq: Every day | ORAL | Status: DC
  Administered 2021-11-10 – 2021-11-23 (×13): 5 mg via ORAL
  Filled 2021-11-10 (×14): qty 1

## 2021-11-10 MED ORDER — VANCOMYCIN HCL IN DEXTROSE 1-5 GM/200ML-% IV SOLN
1000.0000 mg | Freq: Two times a day (BID) | INTRAVENOUS | Status: DC
Start: 2021-11-10 — End: 2021-11-11
  Administered 2021-11-10 – 2021-11-11 (×2): 1000 mg via INTRAVENOUS
  Filled 2021-11-10 (×3): qty 200

## 2021-11-10 MED ORDER — METOPROLOL TARTRATE 25 MG PO TABS
25.0000 mg | ORAL_TABLET | Freq: Two times a day (BID) | ORAL | Status: DC
  Administered 2021-11-10 – 2021-11-15 (×12): 25 mg via ORAL
  Filled 2021-11-10 (×12): qty 1

## 2021-11-10 MED ORDER — BISACODYL 5 MG PO TBEC: 5.0000 mg | DELAYED_RELEASE_TABLET | Freq: Every day | ORAL | Status: DC | PRN

## 2021-11-10 MED ORDER — UMECLIDINIUM BROMIDE 62.5 MCG/ACT IN AEPB
1.0000 | INHALATION_SPRAY | Freq: Every day | RESPIRATORY_TRACT | Status: DC
  Administered 2021-11-16 – 2021-11-25 (×10): 1 via RESPIRATORY_TRACT
  Filled 2021-11-10 (×3): qty 7

## 2021-11-10 MED ORDER — SODIUM CHLORIDE 0.9% FLUSH
10.0000 mL | Freq: Three times a day (TID) | INTRAVENOUS | Status: DC
  Administered 2021-11-10 – 2021-11-16 (×12): 10 mL

## 2021-11-10 MED ORDER — SODIUM CHLORIDE 0.9 % IV SOLN
500.0000 mg | Freq: Once | INTRAVENOUS | Status: AC
  Administered 2021-11-10: 500 mg via INTRAVENOUS
  Filled 2021-11-10: qty 5

## 2021-11-10 MED ORDER — LACTATED RINGERS IV SOLN: INTRAVENOUS | Status: DC

## 2021-11-10 MED ORDER — ACETAMINOPHEN 650 MG RE SUPP: 650.0000 mg | Freq: Four times a day (QID) | RECTAL | Status: DC | PRN

## 2021-11-10 MED ORDER — ONDANSETRON HCL 4 MG PO TABS: 4.0000 mg | ORAL_TABLET | Freq: Four times a day (QID) | ORAL | Status: DC | PRN

## 2021-11-10 MED ORDER — AMLODIPINE BESYLATE 5 MG PO TABS
5.0000 mg | ORAL_TABLET | Freq: Every day | ORAL | Status: DC
  Administered 2021-11-10 – 2021-11-15 (×6): 5 mg via ORAL
  Filled 2021-11-10 (×6): qty 1

## 2021-11-10 MED ORDER — NICOTINE 14 MG/24HR TD PT24
14.0000 mg | MEDICATED_PATCH | Freq: Every day | TRANSDERMAL | Status: DC
Start: 2021-11-10 — End: 2021-11-25
  Administered 2021-11-10 – 2021-11-25 (×15): 14 mg via TRANSDERMAL
  Filled 2021-11-10 (×16): qty 1

## 2021-11-10 MED ORDER — SODIUM CHLORIDE 0.9 % IV SOLN: INTRAVENOUS | Status: DC

## 2021-11-10 MED ORDER — STERILE WATER FOR INJECTION IJ SOLN
5.0000 mg | Freq: Once | RESPIRATORY_TRACT | Status: AC
  Administered 2021-11-10: 5 mg via INTRAPLEURAL
  Filled 2021-11-10: qty 5

## 2021-11-10 MED ORDER — MORPHINE SULFATE (PF) 2 MG/ML IV SOLN: 2.0000 mg | INTRAVENOUS | Status: DC | PRN

## 2021-11-10 MED ORDER — POLYETHYLENE GLYCOL 3350 17 G PO PACK: 17.0000 g | PACK | Freq: Every day | ORAL | Status: DC | PRN

## 2021-11-10 MED ORDER — HYDRALAZINE HCL 20 MG/ML IJ SOLN: 5.0000 mg | INTRAMUSCULAR | Status: DC | PRN

## 2021-11-10 MED ORDER — OXYCODONE HCL 5 MG PO TABS
5.0000 mg | ORAL_TABLET | ORAL | Status: DC | PRN
  Administered 2021-11-11 – 2021-11-15 (×2): 5 mg via ORAL
  Filled 2021-11-10 (×3): qty 1

## 2021-11-10 MED ORDER — GUAIFENESIN ER 600 MG PO TB12: 600.0000 mg | ORAL_TABLET | Freq: Two times a day (BID) | ORAL | Status: DC | PRN

## 2021-11-10 MED ORDER — ENOXAPARIN SODIUM 40 MG/0.4ML IJ SOSY
40.0000 mg | PREFILLED_SYRINGE | INTRAMUSCULAR | Status: DC
  Administered 2021-11-11 – 2021-11-25 (×12): 40 mg via SUBCUTANEOUS
  Filled 2021-11-10 (×13): qty 0.4

## 2021-11-10 MED ORDER — ACETAMINOPHEN 325 MG PO TABS
650.0000 mg | ORAL_TABLET | Freq: Four times a day (QID) | ORAL | Status: DC | PRN
  Administered 2021-11-13 – 2021-11-24 (×10): 650 mg via ORAL
  Filled 2021-11-10 (×10): qty 2

## 2021-11-10 MED ORDER — SODIUM CHLORIDE 0.9% FLUSH
3.0000 mL | Freq: Two times a day (BID) | INTRAVENOUS | Status: DC
  Administered 2021-11-10 – 2021-11-25 (×24): 3 mL via INTRAVENOUS

## 2021-11-10 NOTE — Procedures (Signed)
Insertion of Chest Tube Procedure Note ? ?Sean Farrell  ?[id number]  ?03/08/1946 ? ?Date:11/10/21  ?Time:2:43 PM  ? ? ?Provider Performing: Freddi Starr  ? ?Procedure: Chest Tube Insertion (15176) ? ?Indication(s) ?Effusion ? ?Consent ?Risks of the procedure as well as the alternatives and risks of each were explained to the patient and/or caregiver.  Consent for the procedure was obtained and is signed in the bedside chart ? ?Anesthesia ?Topical only with 1% lidocaine  ? ? ?Time Out ?Verified patient identification, verified procedure, site/side was marked, verified correct patient position, special equipment/implants available, medications/allergies/relevant history reviewed, required imaging and test results available. ? ? ?Sterile Technique ?Maximal sterile technique including full sterile barrier drape, hand hygiene, sterile gown, sterile gloves, mask, hair covering, sterile ultrasound probe cover (if used). ? ? ?Procedure Description ?Ultrasound used to identify appropriate pleural anatomy for placement and overlying skin marked. Area of placement cleaned and draped in sterile fashion.  A 14 French pigtail pleural catheter was placed into the left pleural space using Seldinger technique. Appropriate return of fluid was obtained.  The tube was connected to atrium and placed on -20 cm H2O wall suction. ? ? ?Complications/Tolerance ?None; patient tolerated the procedure well. ?Chest X-ray is ordered to verify placement. ? ? ?EBL ?Minimal ? ?Specimen(s) ?fluid  ?

## 2021-11-10 NOTE — ED Notes (Addendum)
Report called and given to [facility]. Will transport patient once room is clean. ?

## 2021-11-10 NOTE — ED Notes (Signed)
The bed is showing clean on 2c although when I called the floor  I was told that the bed has been dirty for 2 hours and is still not cleaned  they are unsure  why the bed is not clean ?

## 2021-11-10 NOTE — ED Provider Notes (Signed)
?[facility] ?Provider Note ? ? ?CSN: [id number] ?Arrival date & time: 11/10/21  4132 ? ?  ? ?History ? ?Chief Complaint  ?Patient presents with  ? Shortness of Breath  ? Back Pain  ? Dehydration  ? ? ?Sean Farrell is a 76 y.o. male with a history of tuberculosis (2012), hepatitic C, polysubstance abuse, recurring pneumonia, presenting to the emergency department with shortness of breath, back pain, ongoing for approximately 2 weeks.  Patient is overall an extremely poor historian.  He reports that he just felt "bad for 2 weeks".  He reports that he does have a productive cough.  He endorses night sweats.   ? ?Medical records were show a CT scan of the chest with contrast in 2010 which showed concern for a large cavitary lesion in the right upper lobe abutting the mediastinum, which may be concerning for neoplasm versus infectious process.  [facility] notes at the time noted that the patient was AFB 4+ concerning for tuberculosis, and he was started on 4 drug therapy at the time. ? ?Patient tells me now that he has not followed up with infectious disease doctor is not currently taking any medications to manage tuberculosis. ? ? ?HPI ? ?  ? ?Home Medications ?Prior to Admission medications   ?Medication Sig Start Date End Date Taking? Authorizing Provider  ?amLODipine (NORVASC) 10 MG tablet Take 10 mg by mouth daily. 05/09/17   [provider]  ?amLODipine (NORVASC) 5 MG tablet Take 5 mg by mouth daily. 10/26/21   [provider]  ?diclofenac (VOLTAREN) 75 MG EC tablet Take 75 mg by mouth 2 (two) times daily as needed for pain. 10/27/21   [provider]  ?metoprolol tartrate (LOPRESSOR) 25 MG tablet Take 0.5 tablets (12.5 mg total) by mouth 2 (two) times daily. 07/09/17   Alma Friendly, MD  ?nicotine (NICODERM CQ - DOSED IN MG/24 HOURS) 21 mg/24hr patch Place 1 patch (21 mg total) onto the skin daily. 07/10/17   Alma Friendly, MD   ?SPIRIVA HANDIHALER 18 MCG inhalation capsule Place 1 capsule into inhaler and inhale daily. 07/31/21   [provider]  ?tiZANidine (ZANAFLEX) 4 MG tablet Take 4 mg by mouth daily. 05/09/17   [provider]  ?zolpidem (AMBIEN) 5 MG tablet Take 5 mg by mouth at bedtime. 05/09/17   [provider]  ?   ? ?Allergies    ?Patient has no known allergies.   ? ?Review of Systems   ?Review of Systems ? ?Physical Exam ?Updated Vital Signs ?BP 133/62   Pulse 96   Temp (!) 97.4 ?F (36.3 ?C) (Oral)   Resp (!) 25   Ht '6\' 2"'$  (1.88 m)   Wt 77.1 kg   SpO2 97%   BMI 21.83 kg/m?  ?Physical Exam ?Constitutional:   ?   General: He is not in acute distress. ?HENT:  ?   Head: Normocephalic and atraumatic.  ?Eyes:  ?   Conjunctiva/sclera: Conjunctivae normal.  ?   Pupils: Pupils are equal, round, and reactive to light.  ?Cardiovascular:  ?   Rate and Rhythm: Normal rate and regular rhythm.  ?Pulmonary:  ?   Effort: Pulmonary effort is normal. No respiratory distress.  ?   Breath sounds: Examination of the left-middle field reveals decreased breath sounds. Examination of the left-lower field reveals decreased breath sounds. Decreased breath sounds present.  ?Abdominal:  ?   General: There is no distension.  ?  Tenderness: There is no abdominal tenderness.  ?Skin: ?   General: Skin is warm and dry.  ?Neurological:  ?   General: No focal deficit present.  ?   Mental Status: He is alert. Mental status is at baseline.  ?Psychiatric:     ?   Mood and Affect: Mood normal.     ?   Behavior: Behavior normal.  ? ? ?ED Results / Procedures / Treatments   ?Labs ?(all labs ordered are listed, but only abnormal results are displayed) ?Labs Reviewed  ?BASIC METABOLIC PANEL - Abnormal; Notable for the following components:  ?    Result Value  ? CO2 18 (*)   ? Glucose, Bld 112 (*)   ? All other components within normal limits  ?CBC - Abnormal; Notable for the following components:  ? WBC 36.7 (*)   ? RBC 3.63 (*)   ?  Hemoglobin 10.6 (*)   ? HCT 34.1 (*)   ? Platelets 538 (*)   ? All other components within normal limits  ?RESP PANEL BY RT-PCR (FLU A&B, COVID) [id number]  ?CULTURE, BLOOD (ROUTINE X 2)  ?CULTURE, BLOOD (ROUTINE X 2)  ?MRSA NEXT GEN BY PCR, NASAL  ?EXPECTORATED SPUTUM ASSESSMENT W GRAM STAIN, RFLX TO RESP C  ?ACID FAST SMEAR (AFB, MYCOBACTERIA)  ?ACID FAST CULTURE WITH REFLEXED SENSITIVITIES (MYCOBACTERIA)  ?HEPATIC FUNCTION PANEL  ?LACTIC ACID, PLASMA  ?LACTIC ACID, PLASMA  ?PROTIME-INR  ?APTT  ?URINALYSIS, ROUTINE W REFLEX MICROSCOPIC  ? ? ?EKG ?EKG Interpretation ? ?Date/Time:  Friday Nov 10 2021 08:24:40 EDT ?Ventricular Rate:  107 ?PR Interval:  214 ?QRS Duration: 76 ?QT Interval:  318 ?QTC Calculation: 424 ?R Axis:   -25 ?Text Interpretation: Sinus tachycardia with 1st degree A-V block with Premature atrial complexes Minimal voltage criteria for LVH, may be normal variant ( R in aVL ) Inferior infarct , age undetermined Abnormal ECG When compared with ECG of 05-Jul-2017 19:39, PREVIOUS ECG IS PRESENT Confirmed by Octaviano Glow [phone]) on 11/10/2021 10:01:17 AM ? ?Radiology ?DG Chest 2 View ? ?Result Date: 11/10/2021 ?CLINICAL DATA:  76 year old male with shortness of breath. Dehydration. EXAM: CHEST - 2 VIEW COMPARISON:  Chest radiographs 09/05/2016 and earlier. FINDINGS: Abnormal left lung base in 2018 with mild patchy opacity at that time. But now a moderate sized area of confluent opacity throughout the left mid and lower lung most resembles a loculated pleural effusion. Only a small volume of aerated left lung base. Chronic elevation of the left hemidiaphragm. Small contralateral right pleural effusion. Some pleural fluid tracking in the fissures on the lateral view. No pneumothorax or pulmonary edema. Mediastinal contours which are not obscured appear stable and within normal limits. No acute osseous abnormality identified. Negative visible bowel gas. IMPRESSION: 1. Progressed abnormal left lung base since  2018 radiographs, felt at least in part related to pleural effusion with loculation. But otherwise indeterminate and recommend Chest CT with IV contrast to further characterize. 2. Small right pleural effusion is also new. Electronically Signed   By: Genevie Ann M.D.   On: 11/10/2021 09:08   ? ?Procedures ?[facility] KitchenCritical Care ?Performed by: Wyvonnia Dusky, MD ?Authorized by: Wyvonnia Dusky, MD  ? ?Critical care provider statement:  ?  Critical care time (minutes):  45 ?  Critical care time was exclusive of:  Separately billable procedures and treating other patients ?  Critical care was necessary to treat or prevent imminent or life-threatening deterioration of the following conditions:  Sepsis ?  Critical care  was time spent personally by me on the following activities:  Ordering and performing treatments and interventions, ordering and review of laboratory studies, ordering and review of radiographic studies, pulse oximetry, review of old charts, examination of patient and evaluation of patient's response to treatment ?  Care discussed with: admitting provider    ? ? ?Medications Ordered in ED ?Medications  ?lactated ringers infusion ( Intravenous New Bag/Given 11/10/21 1040)  ?lactated ringers bolus 1,000 mL (1,000 mLs Intravenous New Bag/Given 11/10/21 1043)  ?  And  ?lactated ringers bolus 1,000 mL (1,000 mLs Intravenous New Bag/Given 11/10/21 1041)  ?  And  ?lactated ringers bolus 500 mL (has no administration in time range)  ?ceFEPIme (MAXIPIME) 2 g in sodium chloride 0.9 % 100 mL IVPB (has no administration in time range)  ?vancomycin (VANCOREADY) IVPB 1750 mg/350 mL (1,750 mg Intravenous New Bag/Given 11/10/21 1045)  ?vancomycin (VANCOCIN) IVPB 1000 mg/200 mL premix (has no administration in time range)  ? ? ?ED Course/ Medical Decision Making/ A&P ?Clinical Course as of 11/10/21 1104  ?Fri Nov 10, 2021  ?0957 Nursing and staff notified patient has a history of active TB in 2012 and is high risk for recurring active  TB - he will need placement in negative pressure room, and airborne precautions are ordered [MT]  ?1104 Admitted to Dr Lorin Mercy hospitalist [MT]  ?  ?Clinical Course User Index ?[MT] Wyvonnia Dusky, MD  ? ?

## 2021-11-10 NOTE — Consult Note (Signed)
? ?NAME:  Sean Farrell, MRN:  [id number], DOB:  [birth year], LOS: 0 ?ADMISSION DATE:  11/10/2021, CONSULTATION DATE:  11/10/21 ?REFERRING MD:  Sherren Mocha, MD CHIEF COMPLAINT:  Pneumonia  ? 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 He reports decreased appetite over recent months and has noted night sweats. He denies fevers and chills. He smokes cigarettes and marijuana regularly.  ? ?PCCM has been consulted for loculated pleural effusion and concern for TB.  ? ?Pertinent  Medical History  ?Hypertension ?Tobacco Abuse ?Mycobacterium Tuberculosis 2010 ?Hepatitis C ?Polysubstance Use ? ?Significant Hospital Events: ?Including procedures, antibiotic start and stop dates in addition to other pertinent events   ?5/5 admitted, left pigtail chest tube placed ? ?Interim History / Subjective:  ?As above ? ?Objective   ?Blood pressure 134/63, pulse 93, temperature (!) 97.4 ?F (36.3 ?C), temperature source Oral, resp. rate 16, height '6\' 2"'$  (1.88 m), weight 77.1 kg, SpO2 97 %. ?   ?   ?No intake or output data in the 24 hours ending 11/10/21 1213 ?Filed Weights  ? 11/10/21 0837  ?Weight: 77.1 kg  ? ? ?Examination: ?General: elderly male, cachectic appearing, no acute distress ?HENT: Oval/AT, sclera anicteric, moist mucous membranes ?Lungs: decreased breath sounds on left. No wheezing ?Cardiovascular: tachycardic, no murmurs ?Abdomen: soft, non-tender, non-distended, BS+ ?Extremities: warm, no edema ?Neuro: A&O x 3, moving all extremities ?GU: n/a ? ?Resolved Hospital Problem list   ? ? ?Assessment & Plan:  ?Left Loculated Pleural Effusion ?Multiple Pulmonary Nodules ?Concern for metastatic disease with multiple Liver Lesions and multiple bony lytic lesions in thoracic spin and bilateral  ribs ?Severe Protein Calorie Malnutrition ? ?Discussion: ?Left loculated effusion differential includes malignancy vs infectious etiology.  ? ?Plan: ?- Left chest tube to -20cmH2O, flush chest tube qshift ?- Will give first dose of TPA/DNAase given persistent loculations after placement on chest radiograph ?- Pleural fluid sent for cultures and cytology ?- Recommend CT abdomen and pelvis for further metastatic disease workup.  ?- If pleural fluid cytology not revealing can consider IR consult for liver biopsy or other more amenable site ?- continue broad spectrum antibiotics, follow cultures ? ?PCCM to follow for chest tube ? ?Best Practice (right click and "Reselect all SmartList Selections" daily)  ? ?Per Primary ? ?Labs   ?CBC: ?Recent Labs  ?Lab 11/10/21 ?9381  ?WBC 36.7*  ?HGB 10.6*  ?HCT 34.1*  ?MCV 93.9  ?PLT 538*  ? ? ?Basic Metabolic Panel: ?Recent Labs  ?Lab 11/10/21 ?0175  ?NA 135  ?K 4.1  ?CL 105  ?CO2 18*  ?GLUCOSE 112*  ?BUN 19  ?CREATININE 0.95  ?CALCIUM 9.6  ? ?GFR: ?Estimated Creatinine Clearance: 74.4 mL/min (by C-G formula based on SCr of 0.95 mg/dL). ?Recent Labs  ?Lab 11/10/21 ?0853 11/10/21 ?1050  ?WBC 36.7*  --   ?LATICACIDVEN  --  1.8  ? ? ?Liver Function Tests: ?Recent Labs  ?Lab 11/10/21 ?1050  ?AST 40  ?ALT 16  ?ALKPHOS 179*  ?BILITOT 1.8*  ?PROT 7.0  ?ALBUMIN 2.4*  ? ?No results for input(s): LIPASE, AMYLASE in the last 168 hours. ?No results for input(s): AMMONIA in the last 168 hours. ? ?ABG ?No results found for: PHART, PCO2ART, PO2ART, HCO3, TCO2, ACIDBASEDEF, O2SAT  ? ?Coagulation Profile: ?Recent Labs  ?Lab  11/10/21 ?1050  ?INR 1.0  ? ? ?Cardiac Enzymes: ?No results for input(s): CKTOTAL, CKMB, CKMBINDEX, TROPONINI in the last 168 hours. ? ?HbA1C: ?Hgb A1c MFr Bld  ?Date/Time Value Ref Range Status  ?03/06/2011 10:42 PM 5.2 <5.7 % Final  ?  Comment:  ?  (NOTE) ?                                                                      ?According to the ADA Clinical Practice  Recommendations for 2011, when ?HbA1c is used as a screening test: ? >=6.5%   Diagnostic of Diabetes Mellitus ?          (if abnormal result is confirmed) ?5.7-6.4%   Increased risk of developing Diabetes Mellitus ?References:Diagnosis and Classification of Diabetes Mellitus,Diabetes ?ACZY,6063,01(SWFUX 1):S62-S69 and Standards of Medical Care in         ?Diabetes - 2011,Diabetes Care,2011,34 (Suppl 1):S11-S61.  ? ? ?CBG: ?No results for input(s): GLUCAP in the last 168 hours. ? ?Review of Systems:   ?Review of Systems  ?Constitutional:  Positive for malaise/fatigue and weight loss. Negative for chills and fever.  ?HENT:  Negative for congestion, sinus pain and sore throat.   ?Eyes: Negative.   ?Respiratory:  Positive for cough and shortness of breath. Negative for hemoptysis, sputum production and wheezing.   ?Cardiovascular:  Negative for chest pain, palpitations, orthopnea, claudication and leg swelling.  ?Gastrointestinal:  Negative for abdominal pain, heartburn, nausea and vomiting.  ?Genitourinary: Negative.   ?Musculoskeletal:  Negative for joint pain and myalgias.  ?Skin:  Negative for rash.  ?Neurological:  Negative for weakness.  ?Endo/Heme/Allergies: Negative.   ?Psychiatric/Behavioral: Negative.    ? ? ?Past Medical History:  ?He,  has a past medical history of Hypertension and Tobacco abuse.  ? ?Surgical History:  ? ?Past Surgical History:  ?Procedure Laterality Date  ? Left ankle surgery    ?  ? ?Social History:  ? reports that he has been smoking. He uses smokeless tobacco. He reports current drug use. Drug: Marijuana. He reports that he does not drink alcohol.  ? ?Family History:  ?His family history includes Alcoholism in his father; Breast cancer in his mother and sister.  ? ?Allergies ?No Known Allergies  ? ?Home Medications  ?Prior to Admission medications   ?Medication Sig Start Date End Date Taking? Authorizing Provider  ?amLODipine (NORVASC) 10 MG tablet Take 10 mg by mouth daily. 05/09/17    [provider]  ?amLODipine (NORVASC) 5 MG tablet Take 5 mg by mouth daily. 10/26/21   [provider]  ?diclofenac (VOLTAREN) 75 MG EC tablet Take 75 mg by mouth 2 (two) times daily as needed for pain. 10/27/21   [provider]  ?metoprolol tartrate (LOPRESSOR) 25 MG tablet Take 0.5 tablets (12.5 mg total) by mouth 2 (two) times daily. 07/09/17   Alma Friendly, MD  ?nicotine (NICODERM CQ - DOSED IN MG/24 HOURS) 21 mg/24hr patch Place 1 patch (21 mg total) onto the skin daily. 07/10/17   Alma Friendly, MD  ?SPIRIVA HANDIHALER 18 MCG inhalation capsule Place 1 capsule into inhaler and inhale daily. 07/31/21   [provider]  ?tiZANidine (ZANAFLEX) 4 MG tablet Take 4 mg by mouth daily. 05/09/17  [provider]  ?zolpidem (AMBIEN) 5 MG tablet Take 5 mg by mouth at bedtime. 05/09/17   [provider]  ?  ? ?Critical care time: n/a ?  ? ?Freda Jackson, MD ?[facility] ?Office: [phone] ? ? ?See Amion for personal pager ?PCCM on call pager [phone] until 7pm. ?Please call Elink 7p-7a. [phone] ? ? ?  ?

## 2021-11-10 NOTE — H&P (Signed)
?History and Physical  ? ? ?Patient: Sean Farrell SNK:[id number] DOB: 1946-06-20 ?DOA: 11/10/2021 ?DOS: the patient was seen and examined on 11/10/2021 ?PCP: Nolene Ebbs, MD  ?Patient coming from: Home - lives alone; NOK: Peri Maris, [phone] ? ? ?Chief Complaint: SOB ? ?HPI: Sean Farrell is a 76 y.o. male with medical history significant of HTN, chronic combined CHF, and tobacco dependence presenting with SOB.  He is a very poor historian and reports "feeling bad" for a period of time.  Some cough, some SOB.  No apparent fever but he does have night sweats for maybe a month.  He has prior h/o TB that he got "from a woman" but he wasn't close with her.  He has been imprisoned in the past.   ? ?I spoke with his sister.  About 2-3+ weeks, SOB, pain in his side, can't walk far without getting out of breath, no appetite.   ? ? ? ?ER Course:  Sepsis PNA, ?TB.  Diagnosed 10 years ago, treated through the health department.  L chest pain and cough x 2 weeks, night sweats for a few months.  WBC 36.  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  On Airborne precautions, CT pending.  Given Vanc/Cefepime (also with h/o MRSA).   ? ? ? ? ?Review of Systems: As mentioned in the history of present illness. All other systems reviewed and are negative. ?Past Medical History:  ?Diagnosis Date  ? Hypertension   ? Marijuana abuse   ? Tobacco abuse   ? Tuberculosis 2010  ? ?Past Surgical History:  ?Procedure Laterality Date  ? Left ankle surgery    ? ?Social History:  reports that he has been smoking cigarettes. He has a 54.00 pack-year smoking history. He uses smokeless tobacco. He reports current drug use. Drug: Marijuana. He reports that he does not drink alcohol. ? ?No Known Allergies ? ?Family History  ?Problem Relation Age of Onset  ? Breast cancer Mother   ? Alcoholism Father   ? Breast cancer Sister   ? ? ?Prior to Admission medications   ?Medication Sig Start Date End Date Taking? Authorizing Provider  ?amLODipine  (NORVASC) 10 MG tablet Take 10 mg by mouth daily. 05/09/17   [provider]  ?amLODipine (NORVASC) 5 MG tablet Take 5 mg by mouth daily. 10/26/21   [provider]  ?diclofenac (VOLTAREN) 75 MG EC tablet Take 75 mg by mouth 2 (two) times daily as needed for pain. 10/27/21   [provider]  ?metoprolol tartrate (LOPRESSOR) 25 MG tablet Take 0.5 tablets (12.5 mg total) by mouth 2 (two) times daily. 07/09/17   Alma Friendly, MD  ?nicotine (NICODERM CQ - DOSED IN MG/24 HOURS) 21 mg/24hr patch Place 1 patch (21 mg total) onto the skin daily. 07/10/17   Alma Friendly, MD  ?SPIRIVA HANDIHALER 18 MCG inhalation capsule Place 1 capsule into inhaler and inhale daily. 07/31/21   [provider]  ?tiZANidine (ZANAFLEX) 4 MG tablet Take 4 mg by mouth daily. 05/09/17   [provider]  ?zolpidem (AMBIEN) 5 MG tablet Take 5 mg by mouth at bedtime. 05/09/17   [provider]  ? ? ?Physical Exam: ?Vitals:  ? 11/10/21 1115 11/10/21 1326 11/10/21 1330 11/10/21 1345  ?BP: 134/63 (!) 144/69  (!) 150/71  ?Pulse: 93 (!) 107 (!) 124 (!) 108  ?Resp: 16 (!) 23 (!) 34 17  ?Temp:      ?TempSrc:      ?SpO2: 97%  94% 96% 100%  ?Weight:      ?Height:      ? ?General:  Appears calm and comfortable and is in NAD ?Eyes:   EOMI, normal lids, iris ?ENT:  grossly normal hearing, lips & tongue, mmm ?Neck:  no LAD, masses or thyromegaly ?Cardiovascular:  RR with mild tachycardia, no m/r/g. No LE edema.  ?Respiratory:   CTA bilaterally with diminished breast sounds in LLL. Normal to mildly increased respiratory effort. On RA ?Abdomen:  soft, NT, ND ?Skin:  no rash or induration seen on limited exam ?Musculoskeletal:  grossly normal tone BUE/BLE, good ROM, no bony abnormality ?Psychiatric:  grossly normal mood and affect, speech fluent and appropriate, AOx3 ?Neurologic:  CN 2-12 grossly intact, moves all extremities in coordinated fashion ? ? ?Radiological Exams on Admission: ?Independently  reviewed - see discussion in A/P where applicable ? ?DG Chest 2 View ? ?Result Date: 11/10/2021 ?CLINICAL DATA:  76 year old male with shortness of breath. Dehydration. EXAM: CHEST - 2 VIEW COMPARISON:  Chest radiographs 09/05/2016 and earlier. FINDINGS: Abnormal left lung base in 2018 with mild patchy opacity at that time. But now a moderate sized area of confluent opacity throughout the left mid and lower lung most resembles a loculated pleural effusion. Only a small volume of aerated left lung base. Chronic elevation of the left hemidiaphragm. Small contralateral right pleural effusion. Some pleural fluid tracking in the fissures on the lateral view. No pneumothorax or pulmonary edema. Mediastinal contours which are not obscured appear stable and within normal limits. No acute osseous abnormality identified. Negative visible bowel gas. IMPRESSION: 1. Progressed abnormal left lung base since 2018 radiographs, felt at least in part related to pleural effusion with loculation. But otherwise indeterminate and recommend Chest CT with IV contrast to further characterize. 2. Small right pleural effusion is also new. Electronically Signed   By: Genevie Ann M.D.   On: 11/10/2021 09:08  ? ?CT Chest W Contrast ? ?Result Date: 11/10/2021 ?CLINICAL DATA:  Shortness of breath and back pain.  History of TB. EXAM: CT CHEST WITH CONTRAST TECHNIQUE: Multidetector CT imaging of the chest was performed during intravenous contrast administration. RADIATION DOSE REDUCTION: This exam was performed according to the departmental dose-optimization program which includes automated exposure control, adjustment of the mA and/or kV according to patient size and/or use of iterative reconstruction technique. CONTRAST:  6m OMNIPAQUE IOHEXOL 300 MG/ML  SOLN COMPARISON:  Chest x-ray from same day. CT chest dated September 17, 2008. FINDINGS: Cardiovascular: No significant vascular findings. Normal heart size. No pericardial effusion. No thoracic aortic  aneurysm or dissection. Coronary, aortic arch, and branch vessel atherosclerotic vascular disease. No central pulmonary embolism. Mediastinum/Nodes: No enlarged mediastinal, hilar, or axillary lymph nodes. Thyroid gland, trachea, and esophagus demonstrate no significant findings. Lungs/Pleura: Moderate loculated left pleural effusion with partial collapse of the lingula and left lower lobe. Small right pleural effusion. Several small solid pulmonary nodules in both lungs measuring up to 5 mm nodule in the left upper lobe (series 3, image 40). Nodular scarring and calcification in the right upper and lower lobes. Mild centrilobular and paraseptal emphysema. No consolidation or pneumothorax. Upper Abdomen: Multiple centrally hypodense heterogeneously enhancing liver lesions measuring up to 6.9 cm Musculoskeletal: Multiple small lytic lesions scattered throughout the thoracic spine and bilateral ribs, new since 2010. IMPRESSION: 1. Constellation of findings consistent with metastatic disease including multiple liver lesions and multiple bony lytic lesions in the thoracic spine and bilateral ribs. No definite primary lung malignancy. Multiple small solid  noncalcified pulmonary nodules in both lungs measuring up to 5 mm are indeterminate, but could represent metastatic disease as well. 2. Moderate loculated left pleural effusion with partial collapse of the lingula and left lower lobe. Small right pleural effusion. 3. Sequelae of prior granulomatous disease in the right lung. 4. Aortic Atherosclerosis (ICD10-I70.0) and Emphysema (ICD10-J43.9). Electronically Signed   By: Titus Dubin M.D.   On: 11/10/2021 12:02   ? ?EKG: Independently reviewed.  Sinus tachycardia with first degree AV block with rate 107; no evidence of acute ischemia ? ? ?Labs on Admission: I have personally reviewed the available labs and imaging studies at the time of the admission. ? ?Pertinent labs:   ? ?CO2 18 ?Glucose 112 ?AP 179 ?Albumin  2.4 ?WBC 36.7 ?Hgb 10.6 ?Platelets 538 ?INR 1 ?Lactate 1.8 ?COVID/flu pending ?Blood cultures pending ? ? ?Assessment and Plan: ?Active Problems: ?  Hypertension ?  Lobar pneumonia ([place]) ?  Tobacco abuse ?  Chronic c

## 2021-11-10 NOTE — Progress Notes (Signed)
Pharmacy Antibiotic Note ? ?Sean Farrell is a 76 y.o. male admitted on 11/10/2021 with sepsis.  Pharmacy has been consulted for vancomycin and cefepime dosing. ? ?Scr 0.95, WBC 36.7, afebrile ? ?Plan: ?Vancomycin 1750 mg x1 then 1000 mg q12h (expected AUC 545, Scr 0.95) ?Cefepime 2g q8h  ?F/u clinical course, cultures, LOT, and ability to de-escalate ? ?Height: '6\' 2"'$  (188 cm) ?Weight: 77.1 kg (170 lb) ?IBW/kg (Calculated) : 82.2 ? ?Temp (24hrs), Avg:97.4 ?F (36.3 ?C), Min:97.4 ?F (36.3 ?C), Max:97.4 ?F (36.3 ?C) ? ?Recent Labs  ?Lab 11/10/21 ?8453  ?WBC 36.7*  ?CREATININE 0.95  ?  ?Estimated Creatinine Clearance: 74.4 mL/min (by C-G formula based on SCr of 0.95 mg/dL).   ? ?No Known Allergies ? ?Antimicrobials this admission: ?5/5 vancomycin >>  ?5/5  cefepime >>  ? ?Dose adjustments this admission: ?N/A ? ?Microbiology results: ?5/5 BCx: sent ?5/5 MRSA PCR: sent ? ?Thank you for allowing pharmacy to be a part of this patient?s care. ? ?Eduard Clos Renold Kozar ?11/10/2021 10:04 AM ? ?

## 2021-11-10 NOTE — ED Notes (Signed)
Pt sister Blanch Media want like and update [phone] ?

## 2021-11-10 NOTE — Progress Notes (Signed)
Elink following Code Sepsis bundle. ?

## 2021-11-10 NOTE — ED Triage Notes (Signed)
Pt. Stated, I dont feel good all over , Im dehydrated, my back hurts and SOB for the last 2 weeks ?

## 2021-11-11 ENCOUNTER — Inpatient Hospital Stay (HOSPITAL_COMMUNITY): Payer: Medicare Other

## 2021-11-11 DIAGNOSIS — J90 Pleural effusion, not elsewhere classified: Secondary | ICD-10-CM | POA: Diagnosis present

## 2021-11-11 DIAGNOSIS — I5042 Chronic combined systolic (congestive) and diastolic (congestive) heart failure: Secondary | ICD-10-CM | POA: Diagnosis not present

## 2021-11-11 DIAGNOSIS — Z8611 Personal history of tuberculosis: Secondary | ICD-10-CM | POA: Diagnosis present

## 2021-11-11 DIAGNOSIS — J181 Lobar pneumonia, unspecified organism: Secondary | ICD-10-CM | POA: Diagnosis not present

## 2021-11-11 DIAGNOSIS — K769 Liver disease, unspecified: Secondary | ICD-10-CM | POA: Diagnosis present

## 2021-11-11 DIAGNOSIS — Z8673 Personal history of transient ischemic attack (TIA), and cerebral infarction without residual deficits: Secondary | ICD-10-CM

## 2021-11-11 DIAGNOSIS — I679 Cerebrovascular disease, unspecified: Secondary | ICD-10-CM

## 2021-11-11 DIAGNOSIS — I10 Essential (primary) hypertension: Secondary | ICD-10-CM

## 2021-11-11 DIAGNOSIS — D649 Anemia, unspecified: Secondary | ICD-10-CM | POA: Diagnosis present

## 2021-11-11 LAB — DIFFERENTIAL
Abs Immature Granulocytes: 1.13 10*3/uL — ABNORMAL HIGH (ref 0.00–0.07)
Basophils Absolute: 0.2 10*3/uL — ABNORMAL HIGH (ref 0.0–0.1)
Basophils Relative: 0 %
Eosinophils Absolute: 0.1 10*3/uL (ref 0.0–0.5)
Eosinophils Relative: 0 %
Immature Granulocytes: 3 %
Lymphocytes Relative: 4 %
Lymphs Abs: 1.6 10*3/uL (ref 0.7–4.0)
Monocytes Absolute: 3 10*3/uL — ABNORMAL HIGH (ref 0.1–1.0)
Monocytes Relative: 8 %
Neutro Abs: 33.9 10*3/uL — ABNORMAL HIGH (ref 1.7–7.7)
Neutrophils Relative %: 85 %

## 2021-11-11 LAB — RESPIRATORY PANEL BY PCR

## 2021-11-11 LAB — EXPECTORATED SPUTUM ASSESSMENT W GRAM STAIN, RFLX TO RESP C

## 2021-11-11 MED ORDER — IOHEXOL 300 MG/ML  SOLN
100.0000 mL | Freq: Once | INTRAMUSCULAR | Status: AC | PRN
  Administered 2021-11-11: 100 mL via INTRAVENOUS

## 2021-11-11 MED ORDER — SODIUM CHLORIDE (PF) 0.9 % IJ SOLN
10.0000 mg | Freq: Once | INTRAMUSCULAR | Status: AC
  Administered 2021-11-11: 10 mg via INTRAPLEURAL
  Filled 2021-11-11: qty 10

## 2021-11-11 MED ORDER — SODIUM CHLORIDE 0.9 % IV SOLN
1.0000 g | INTRAVENOUS | Status: DC
  Administered 2021-11-11: 1 g via INTRAVENOUS
  Filled 2021-11-11: qty 10

## 2021-11-11 MED ORDER — STERILE WATER FOR INJECTION IJ SOLN
5.0000 mg | Freq: Once | RESPIRATORY_TRACT | Status: AC
  Administered 2021-11-11: 5 mg via INTRAPLEURAL
  Filled 2021-11-11: qty 5

## 2021-11-11 MED ORDER — DOXYCYCLINE HYCLATE 100 MG PO TABS
100.0000 mg | ORAL_TABLET | Freq: Two times a day (BID) | ORAL | Status: AC
Start: 2021-11-11 — End: 2021-11-15
  Administered 2021-11-11 – 2021-11-15 (×10): 100 mg via ORAL
  Filled 2021-11-11 (×10): qty 1

## 2021-11-11 MED ORDER — SODIUM CHLORIDE 0.9% FLUSH
10.0000 mL | Freq: Three times a day (TID) | INTRAVENOUS | Status: DC
  Administered 2021-11-11 – 2021-11-17 (×13): 10 mL

## 2021-11-11 MED ORDER — IOHEXOL 9 MG/ML PO SOLN
ORAL | Status: AC
  Filled 2021-11-11: qty 1000

## 2021-11-11 NOTE — Plan of Care (Signed)
  Problem: Activity: Goal: Ability to tolerate increased activity will improve Outcome: Progressing   Problem: Respiratory: Goal: Ability to maintain adequate ventilation will improve Outcome: Progressing   

## 2021-11-11 NOTE — Progress Notes (Signed)
? ?  NAME:  Sean Farrell, MRN:  [id number], DOB:  1946-04-14, LOS: 1 ?ADMISSION DATE:  11/10/2021, CONSULTATION DATE:  11/10/21 ?REFERRING MD:  Sherren Mocha, MD CHIEF COMPLAINT:  Pneumonia  ? 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 He reports decreased appetite over recent months and has noted night sweats. He denies fevers and chills. He smokes cigarettes and marijuana regularly.  ? ?PCCM has been consulted for loculated pleural effusion and concern for TB.  ? ?Pertinent  Medical History  ?Hypertension ?Tobacco Abuse ?Mycobacterium Tuberculosis 2010 ?Hepatitis C ?Polysubstance Use ? ?Significant Hospital Events: ?Including procedures, antibiotic start and stop dates in addition to other pertinent events   ?5/5 admitted, left pigtail chest tube placed ? ?Interim History / Subjective:  ? ?No acute events overnight. 290 cc output from chest tube ? ?Objective   ?Blood pressure 138/66, pulse 95, temperature 99 ?F (37.2 ?C), temperature source Oral, resp. rate (!) 23, height '6\' 2"'$  (1.88 m), weight 77.1 kg, SpO2 91 %. ?   ?   ? ?Intake/Output Summary (Last 24 hours) at 11/11/2021 1053 ?Last data filed at 11/11/2021 1000 ?Gross per 24 hour  ?Intake 936.74 ml  ?Output 1085 ml  ?Net -148.26 ml  ? ?Filed Weights  ? 11/10/21 0837  ?Weight: 77.1 kg  ? ? ?Examination: ?Gen:      No acute distress ?HEENT:  EOMI, sclera anicteric ?Neck:     No masses; no thyromegaly ?Lungs:    Clear to auscultation bilaterally; normal respiratory effort ?CV:         Regular rate and rhythm; no murmurs ?Abd:      + bowel sounds; soft, non-tender; no palpable masses, no distension ?Ext:    No edema; adequate peripheral perfusion ?Skin:      Warm and dry; no rash ?Neuro: alert and oriented x 3 ?Psych: normal  mood and affect  ? ?CXR today with left chest tube in place, persistent loculated effusion ? ?Resolved Hospital Problem list   ? ? ?Assessment & Plan:  ?Left Loculated Pleural Effusion ?Multiple Pulmonary Nodules ?Concern for metastatic disease with multiple Liver Lesions and multiple bony lytic lesions in thoracic spin and bilateral ribs ?Severe Protein Calorie Malnutrition ? ?Discussion: ?Left loculated effusion differential includes malignancy vs infectious etiology.  ? ?Plan: ?- Left chest tube to -20cmH2O, flush chest tube qshift ?- Start TPA/DNase treatment ?- Follow cultures and cytology ? ?Best Practice (right click and "Reselect all SmartList Selections" daily)  ? ?Per Primary ? ?Critical care time:   ? ?Marshell Garfinkel MD ?[facility] ?See Amion for pager ? ?If no response to pager , please call [phone] until 7pm ?After 7:00 pm call Elink  [phone] ?11/11/2021, 10:53 AM  ? ?  ?

## 2021-11-11 NOTE — Assessment & Plan Note (Deleted)
Exudative by 2/3 lights.   ?- Consult Pulmonology for chest tube mgmt, lytics ?- See above ?

## 2021-11-11 NOTE — Assessment & Plan Note (Deleted)
CT shows 2 semi-large (3-5cm) liver lesions, consistent with malignancy or abscess.  Also shows left rib mass, nodularity of pleural effusion, pancreatic tail mass, retroperitoneal node. ? ?Strong suspicion overall presenting syndrome is due to metastatic cancer, but there is also decent change that liver mass is abscess. PSA normal.   ?- Continue Zosyn to cover liver abscess ?- Consult IR for biopsy ?- If liver mass is not cancer, would likely need to biopsy bony lytic lesion, which appears malignant (left 9th rib might be candidate) ? ?

## 2021-11-11 NOTE — Assessment & Plan Note (Deleted)
Discussed imaging with IR, infection would be an extremely unusual cause of his disparate imaging findings (liver abscess, bulky L 9th rib mass, pleural nodules, etc) but TB or Blasto could do this. ?- Morning AFB smear x3 ?- Airborne until active TB ruled out ?

## 2021-11-11 NOTE — Assessment & Plan Note (Deleted)
Lung nodules ?Probably pneumonia ?CT chest with left loculated effusion, moderate in size, no significant pneumonia, old granulomatous disease but also several new small nodules concerning for metastases vs infection and a nodular component to pleural disease.  RVP negative.  HIV negative. ? ?Given subacute presentation, night sweats, cough, prior TB, will want to rule out active TB ? ?- AFB smear x3 to rule out TB ?- Continue Zosyn  ?- Continue  doxycycline day 4 of 5 ?? ? ?

## 2021-11-11 NOTE — Assessment & Plan Note (Deleted)
EF 40-45% in the past, no evidence of fluid overload here.  Not on diuretic or ARB or spiro. ?- Continue metoprolol ?

## 2021-11-11 NOTE — Plan of Care (Signed)
?  Problem: Clinical Measurements: ?Goal: Ability to maintain a body temperature in the normal range will improve ?Outcome: Progressing ?  ?Problem: Respiratory: ?Goal: Ability to maintain adequate ventilation will improve ?Outcome: Progressing ?Goal: Ability to maintain a clear airway will improve ?Outcome: Progressing ?  ?Problem: Education: ?Goal: Knowledge of General Education information will improve ?Description: Including pain rating scale, medication(s)/side effects and non-pharmacologic comfort measures ?Outcome: Progressing ?  ?Problem: Health Behavior/Discharge Planning: ?Goal: Ability to manage health-related needs will improve ?Outcome: Progressing ?  ?Problem: Clinical Measurements: ?Goal: Ability to maintain clinical measurements within normal limits will improve ?Outcome: Progressing ?Goal: Will remain free from infection ?Outcome: Progressing ?Goal: Diagnostic test results will improve ?Outcome: Progressing ?Goal: Cardiovascular complication will be avoided ?Outcome: Progressing ?  ?Problem: Nutrition: ?Goal: Adequate nutrition will be maintained ?Outcome: Progressing ?  ?Problem: Coping: ?Goal: Level of anxiety will decrease ?Outcome: Progressing ?  ?Problem: Elimination: ?Goal: Will not experience complications related to bowel motility ?Outcome: Progressing ?Goal: Will not experience complications related to urinary retention ?Outcome: Progressing ?  ?Problem: Safety: ?Goal: Ability to remain free from injury will improve ?Outcome: Progressing ?  ?Problem: Skin Integrity: ?Goal: Risk for impaired skin integrity will decrease ?Outcome: Progressing ?  ?Problem: Activity: ?Goal: Ability to tolerate increased activity will improve ?Outcome: Not Progressing ?  ?Problem: Clinical Measurements: ?Goal: Respiratory complications will improve ?Outcome: Not Progressing ?  ?Problem: Activity: ?Goal: Risk for activity intolerance will decrease ?Outcome: Not Progressing ?  ?Problem: Pain Managment: ?Goal:  General experience of comfort will improve ?Outcome: Not Progressing ?  ?

## 2021-11-11 NOTE — Evaluation (Signed)
Occupational Therapy Evaluation ?Patient Details ?Name: Sean Farrell ?MRN: [id number] ?DOB: [birth year] ?Today's Date: 11/11/2021 ? ? ?History of Present Illness 76 yo male presenting to ED on 5/5 with SOB and back pain. Chest CT showing concern for large cavitary lesion in the right upper lobe abutting the mediastinum. S/p L pigtail chest tube placed on 5/5. PMH including HTN, polysubstance abuse, mycobacterium tuberculosis  (2010), CHF, Hep C, and L ankle sx.  ? ?Clinical Impression ?  ?PTA, pt was living alone and has an aid who assists with dressing and IADLs; pt's sister also assists with IADls and transportation. Pt using a walking stick at baseline. Pt currently requiring Mod A for LB ADLs and Min A for short distance mobility OOB to recliner. Pt presenting with decreased activity tolerance as seen by fatigue.  Pt would benefit from further acute OT to facilitate safe dc. Recommend dc to home with HHOT for further OT to optimize safety, independence with ADLs, and return to PLOF.  ?   ? ?Recommendations for follow up therapy are one component of a multi-disciplinary discharge planning process, led by the attending physician.  Recommendations may be updated based on patient status, additional functional criteria and insurance authorization.  ? ?Follow Up Recommendations ? Home health OT  ?  ?Assistance Recommended at Discharge Frequent or constant Supervision/Assistance  ?Patient can return home with the following A little help with walking and/or transfers;A little help with bathing/dressing/bathroom;Assistance with cooking/housework ? ?  ?Functional Status Assessment ? Patient has had a recent decline in their functional status and demonstrates the ability to make significant improvements in function in a reasonable and predictable amount of time.  ?Equipment Recommendations ? None recommended by OT  ?  ?Recommendations for Other Services PT consult ? ? ?  ?Precautions / Restrictions Precautions ?Precautions:  Fall;Other (comment) ?Precaution Comments: chest tube L  ? ?  ? ?Mobility Bed Mobility ?Overal bed mobility: Needs Assistance ?Bed Mobility: Supine to Sit ?  ?  ?Supine to sit: Min assist, HOB elevated ?  ?  ?General bed mobility comments: Min A for elevating trunk and then scooting L hip to EOB ?  ? ?Transfers ?Overall transfer level: Needs assistance ?Equipment used: None ?Transfers: Sit to/from Stand ?Sit to Stand: Min assist ?  ?  ?  ?  ?  ?General transfer comment: Min A for gaining balance in standing ?  ? ?  ?Balance Overall balance assessment: Needs assistance ?Sitting-balance support: No upper extremity supported, Feet supported ?Sitting balance-Leahy Scale: Good ?  ?  ?Standing balance support: No upper extremity supported, During functional activity ?Standing balance-Leahy Scale: Fair ?  ?  ?  ?  ?  ?  ?  ?  ?  ?  ?  ?  ?   ? ?ADL either performed or assessed with clinical judgement  ? ?ADL Overall ADL's : Needs assistance/impaired ?Eating/Feeding: Supervision/ safety;Set up;Sitting ?  ?Grooming: Supervision/safety;Set up;Sitting ?  ?Upper Body Bathing: Minimal assistance;Sitting ?  ?Lower Body Bathing: Moderate assistance;Sit to/from stand ?  ?Upper Body Dressing : Minimal assistance;Sitting ?  ?Lower Body Dressing: Moderate assistance;Sit to/from stand ?Lower Body Dressing Details (indicate cue type and reason): donning shoes. Difficulty bending forward due to pain ?Toilet Transfer: Minimal assistance;+2 for safety/equipment;Ambulation (simualted to recliner) ?  ?  ?  ?  ?  ?Functional mobility during ADLs: Minimal assistance (few steps to recliner) ?General ADL Comments: Pt presenting with decreased balance, strength, and activity toelrance  ? ? ? ?Vision   ?   ?   ?  Perception   ?  ?Praxis   ?  ? ?Pertinent Vitals/Pain Pain Assessment ?Pain Assessment: Faces ?Faces Pain Scale: Hurts even more ?Pain Location: chest with coughing ?Pain Descriptors / Indicators: Discomfort, Guarding, Grimacing ?Pain  Intervention(s): Monitored during session, Limited activity within patient's tolerance, Repositioned  ? ? ? ?Hand Dominance   ?  ?Extremity/Trunk Assessment Upper Extremity Assessment ?Upper Extremity Assessment: Generalized weakness ?  ?Lower Extremity Assessment ?Lower Extremity Assessment: Defer to PT evaluation ?  ?Cervical / Trunk Assessment ?Cervical / Trunk Assessment: Other exceptions (L chest tube) ?  ?Communication Communication ?Communication: No difficulties ?  ?Cognition Arousal/Alertness: Awake/alert ?Behavior During Therapy: Flat affect ?Overall Cognitive Status: Difficult to assess ?  ?  ?  ?  ?  ?  ?  ?  ?  ?  ?  ?  ?  ?  ?  ?  ?General Comments: Following commands. Requiring increased encouragment ?  ?  ?General Comments  HR elevating to 130s. SpO2 93-89% on RA. RR 20-30s. ? ?  ?Exercises   ?  ?Shoulder Instructions    ? ? ?Home Living Family/patient expects to be discharged to:: Private residence ?Living Arrangements: Alone ?Available Help at Discharge: Family;Available PRN/intermittently;Personal care attendant ?Type of Home: Apartment ?Home Access: Level entry ?  ?  ?Home Layout: One level ?  ?  ?Bathroom Shower/Tub: Tub/shower unit ?  ?Bathroom Toilet: Standard ?  ?  ?Home Equipment: Other (comment);Shower seat (walking stick) ?  ?  ?  ? ?  ?Prior Functioning/Environment Prior Level of Function : Needs assist ?  ?  ?  ?  ?  ?  ?Mobility Comments: using walking stick ?ADLs Comments: CNA 5 days/wk (9-11) who pt reports assists with cleaning house, dressing. Sister drives pt to appointments. ?  ? ?  ?  ?OT Problem List: Decreased strength;Decreased range of motion;Decreased activity tolerance;Impaired balance (sitting and/or standing);Decreased knowledge of use of DME or AE;Decreased knowledge of precautions ?  ?   ?OT Treatment/Interventions: Self-care/ADL training;Therapeutic exercise;Energy conservation;DME and/or AE instruction;Therapeutic activities;Patient/family education  ?  ?OT  Goals(Current goals can be found in the care plan section) Acute Rehab OT Goals ?Patient Stated Goal: Stop pain ?OT Goal Formulation: With patient ?Time For Goal Achievement: 11/25/21 ?Potential to Achieve Goals: Good  ?OT Frequency: Min 2X/week ?  ? ?Co-evaluation PT/OT/SLP Co-Evaluation/Treatment: Yes ?Reason for Co-Treatment: To address functional/ADL transfers;For patient/therapist safety;Other (comment) (Poor activity tolerance) ?  ?OT goals addressed during session: ADL's and self-care ?  ? ?  ?AM-PAC OT "6 Clicks" Daily Activity     ?Outcome Measure Help from another person eating meals?: None ?Help from another person taking care of personal grooming?: A Little ?Help from another person toileting, which includes using toliet, bedpan, or urinal?: A Little ?Help from another person bathing (including washing, rinsing, drying)?: A Lot ?Help from another person to put on and taking off regular upper body clothing?: A Little ?Help from another person to put on and taking off regular lower body clothing?: A Lot ?6 Click Score: 17 ?  ?End of Session Nurse Communication: Mobility status ? ?Activity Tolerance: Patient limited by fatigue ?Patient left: in chair;with call bell/phone within reach;with chair alarm set ? ?OT Visit Diagnosis: Unsteadiness on feet (R26.81);Other abnormalities of gait and mobility (R26.89);Muscle weakness (generalized) (M62.81)  ?              ?Time: 1000-1024 ?OT Time Calculation (min): 24 min ?Charges:  OT General Charges ?$OT Visit: 1 Visit ?OT Evaluation ?$OT  Eval Moderate Complexity: 1 Mod ? ?Sultan Pargas MSOT, OTR/L ?Acute Rehab ?Pager: [phone] ?Office: [phone] ? ?Brookelynne Dimperio M Sylver Vantassell ?11/11/2021, 11:16 AM ?

## 2021-11-11 NOTE — Hospital Course (Addendum)
Sean Farrell is a 76 y.o. M with hx stroke, smoking, hx TB in 2010, and chronic heart failure EF 40-45% who presented with cough, SOB and night sweats for maybe a month. In the ER, CXR showed cavitary PNA, effusion. 5/5: Admitted on airborne precautions, chest tube placed, Pulm consulted, CT showing multiple liver lesions, bony lytic lesions, bilateral lung nodules, no obvious primary 5/6: Obtaining CT abd/pel --> shows pancreatic tail mass; tPA/dornase x1 5/7: IR consulted for liver mass biopsy; lytics second dose 5/10: To IR for liver US and biopsy vs drain placement 5/11: Persistent fever/leukocytosis 5/13: Repeat bcx 5/14: Discontinue antibiotics, persistent leukocytosis 5/15: ID consult. Liver biopsy: Adenocarcinoma, poorly differentiated 5/16: Oncology consult 5/19: Discussion with family and palliative care, plan is to go home with hospice.

## 2021-11-11 NOTE — Assessment & Plan Note (Deleted)
BP controlled - Continue amlodipine, metoprolol 

## 2021-11-11 NOTE — Procedures (Signed)
Pleural Fibrinolytic Administration Procedure Note ? ?Garfield Cornea  ?[id number]  ?[birth year] ? ?Date:11/11/21  ?Time:1:38 PM  ? ?Provider Performing:Jaylan Duggar  ? ?Procedure: Pleural Fibrinolysis Initial day (82423) ? ?Indication(s) ?Fibrinolysis of complicated pleural effusion ? ?Consent ?Risks of the procedure as well as the alternatives and risks of each were explained to the patient and/or caregiver.  Consent for the procedure was obtained. ? ? ?Anesthesia ?None ? ? ?Time Out ?Verified patient identification, verified procedure, site/side was marked, verified correct patient position, special equipment/implants available, medications/allergies/relevant history reviewed, required imaging and test results available. ? ? ?Sterile Technique ?Hand hygiene, gloves ? ? ?Procedure Description ?Existing pleural catheter was cleaned and accessed in sterile manner.  '10mg'$  of tPA in 30cc of saline and '5mg'$  of dornase in 30cc of sterile water were injected into pleural space using existing pleural catheter.  Catheter will be clamped for 1 hour and then placed back to suction. ? ? ?Complications/Tolerance ?None; patient tolerated the procedure well. ? ? ?EBL ?None ? ? ?Specimen(s) ?None ? ? ?Marshell Garfinkel MD ? Pulmonary & Critical care ?See Amion for pager ? ?If no response to pager , please call [phone] until 7pm ?After 7:00 pm call Elink  [phone] ?11/11/2021, 1:38 PM  ? ?

## 2021-11-11 NOTE — Progress Notes (Signed)
?  Progress Note ? ? ?Patient: Sean Farrell DXA:[id number] DOB: 08/02/1945 DOA: 11/10/2021     1 ?DOS: the patient was seen and examined on 11/11/2021 at 11:00AM ?  ? ? ? ?Brief hospital course: ?Sean Farrell is a 76 y.o. M with hx stroke, smoking, hx TB in 2010, and chronic heart failure EF 40-45% who presented with cough, SOB and night sweats for maybe a month. ? ?In the ER, CXR showed cavitary PNA, effusion. ? ? ?5/5: Admitted on airborne precautions, chest tube placed, Pulm consulted, CT showing multiple liver lesions, bony lytic lesions, bilateral lung nodules, no obvious primary ?5/6: Obtaining CT abd/pel ? ? ? ? ?Assessment and Plan: ?* Lung consolidation ([place]) ?CT chest with left loculated effusion, moderate in size, no significant pneumonia, old granulomatous disease but also several new small nodules concerning for metastases ?- AFB smear x3 to rule out TB ?- Follow RVP ?- Continue Abx, narrow to Rocephin/Doxy ?- Follow sputum culture ? ?Loculated pleural effusion ?AFB sensitivity low on pleural fluid.  Exudative by 2/3 lights.   ?- Consult Pulmonology for chest tube mgmt, lytics ?- Follow Pleural culture ? ?History of tuberculosis ?- Morning AFB smear x3 ?- Airborne ? ?Liver lesions and lytic bony lesions ?- Obtain CT abdomen and pelvis ? ?Normocytic anemia ?Hgb mildly low ? ?Cerebrovascular disease ?Not on aspirin or statin ? ?Chronic combined systolic (congestive) and diastolic (congestive) heart failure (HCC) ?EF 40-45% in the past, no evidence of fluid overload here.  Not on diuretic or ARB or spiro. ?- Continue metoprolol ? ?Hypertension ?BP elevated ?- Continue amlodipine, metoprolol ? ? ? ? ? ? ? ? ? ? ?Subjective: Patient has some back pain, he is tired all over, very weak, feels like he has to cough, has the hiccups.  No confusion, fever. ? ? ? ? ?Physical Exam: ?Vitals:  ? 11/10/21 2251 11/10/21 2300 11/11/21 0009 11/11/21 0800  ?BP: (!) 142/72 (!) 152/68 (!) 154/67 138/66  ?Pulse: 97 98 (!) 105 95   ?Resp: (!) 22 (!) 29 (!) 21 (!) 23  ?Temp:   99.1 ?F (37.3 ?C) 99 ?F (37.2 ?C)  ?TempSrc:   Oral Oral  ?SpO2: 92% 92% 93% 91%  ?Weight:      ?Height:      ? ?Thin adult male, sitting in recliner, appears weak and debilitated ?Tachycardic, regular, no murmurs, no peripheral edema, nor JVD ?Respiratory effort shallow, lung sounds diminished, crackles in bilateral bases, no wheezing ?Abdomen soft no tenderness palpation or guarding, no ascites or distention ?Face symmetric, speech fluent, partially edentulous, oropharynx moist, no oral lesions ? ? ? ? ?Data Reviewed: ?Discussed with pulmonology, nursing notes reviewed, vital signs reviewed ?HIV negative ?Pleural fluid studies reviewed ?CT chest personally evaluated, shows marked effusion of the left, some compressive atelectasis, no significant pneumonia, few small pulmonary nodules, some old bullous disease, some old granulomatous disease ?Alk phos elevated ?AST normal ?Creatinine normal ?White blood cell count 36 K ?Hemoglobin 10 ? ?Family Communication:  ? ? ? ?Disposition: ?Status is: Inpatient ? ? ? ? ? ? ? ? ?Author: ?Edwin Dada, MD ?11/11/2021 1:36 PM ? ?For on call review [email or web address].  ? ? ?

## 2021-11-11 NOTE — Assessment & Plan Note (Deleted)
Hgb mildly low, stable, no clinical bleeding ?

## 2021-11-11 NOTE — Evaluation (Signed)
Physical Therapy Evaluation ?Patient Details ?Name: Sean Farrell ?MRN: [id number] ?DOB: 03-14-1946 ?Today's Date: 11/11/2021 ? ?History of Present Illness ? 76 yo male presenting to ED on 5/5 with SOB and back pain. Chest CT showing concern for large cavitary lesion in the right upper lobe abutting the mediastinum. S/p L pigtail chest tube placed on 5/5. PMH including HTN, polysubstance abuse, mycobacterium tuberculosis  (2010), CHF, Hep C, and L ankle sx.  ?Clinical Impression ? PTA, pt lives alone, has an aide 5 days/week who assists with dressing and IADL's; pt sister also assists with IADL's and transportation. Pt uses a walking stick for mobility at baseline. Pt currently presents with decreased cardiopulmonary endurance and pain. Pt requiring minimal assist for transferring from bed to chair. SpO2 86-92% on RA, HR 114-133, BP 146/70 pre mobility and 176/82 post mobility. Pt deferring further ambulation due to fatigue. Will continue to progress mobility as tolerated. ?   ? ?Recommendations for follow up therapy are one component of a multi-disciplinary discharge planning process, led by the attending physician.  Recommendations may be updated based on patient status, additional functional criteria and insurance authorization. ? ?Follow Up Recommendations Home health PT ? ?  ?Assistance Recommended at Discharge Frequent or constant Supervision/Assistance  ?Patient can return home with the following ? A little help with walking and/or transfers;A little help with bathing/dressing/bathroom;Assistance with cooking/housework;Direct supervision/assist for medications management;Direct supervision/assist for financial management;Assist for transportation ? ?  ?Equipment Recommendations Rolling walker (2 wheels)  ?Recommendations for Other Services ?    ?  ?Functional Status Assessment Patient has had a recent decline in their functional status and demonstrates the ability to make significant improvements in function  in a reasonable and predictable amount of time.  ? ?  ?Precautions / Restrictions Precautions ?Precautions: Fall;Other (comment) ?Precaution Comments: chest tube L ?Restrictions ?Weight Bearing Restrictions: No  ? ?  ? ?Mobility ? Bed Mobility ?Overal bed mobility: Needs Assistance ?Bed Mobility: Supine to Sit ?  ?  ?Supine to sit: Min assist, HOB elevated ?  ?  ?General bed mobility comments: Min A for elevating trunk and then scooting L hip to EOB ?  ? ?Transfers ?Overall transfer level: Needs assistance ?Equipment used: None ?Transfers: Sit to/from Stand, Bed to chair/wheelchair/BSC ?Sit to Stand: Min assist ?Stand pivot transfers: Min guard ?  ?  ?  ?  ?General transfer comment: Min A for gaining balance in standing, pivoting to right to chair ?  ? ?Ambulation/Gait ?  ?  ?  ?  ?  ?  ?  ?  ? ?Stairs ?  ?  ?  ?  ?  ? ?Wheelchair Mobility ?  ? ?Modified Rankin (Stroke Patients Only) ?  ? ?  ? ?Balance Overall balance assessment: Needs assistance ?Sitting-balance support: No upper extremity supported, Feet supported ?Sitting balance-Leahy Scale: Good ?  ?  ?Standing balance support: No upper extremity supported, During functional activity ?Standing balance-Leahy Scale: Fair ?  ?  ?  ?  ?  ?  ?  ?  ?  ?  ?  ?  ?   ? ? ? ?Pertinent Vitals/Pain Pain Assessment ?Pain Assessment: Faces ?Faces Pain Scale: Hurts even more ?Pain Location: chest with coughing ?Pain Descriptors / Indicators: Discomfort, Guarding, Grimacing ?Pain Intervention(s): Limited activity within patient's tolerance, Monitored during session  ? ? ?Home Living Family/patient expects to be discharged to:: Private residence ?Living Arrangements: Alone ?Available Help at Discharge: Family;Available PRN/intermittently;Personal care attendant ?Type of Home: Apartment ?Home  Access: Level entry ?  ?  ?  ?Home Layout: One level ?Home Equipment: Other (comment);Shower seat (walking stick) ?   ?  ?Prior Function Prior Level of Function : Needs assist ?  ?  ?  ?  ?   ?  ?Mobility Comments: using walking stick ?ADLs Comments: CNA 5 days/wk (9-11) who pt reports assists with cleaning house, dressing. Sister drives pt to appointments. ?  ? ? ?Hand Dominance  ?   ? ?  ?Extremity/Trunk Assessment  ? Upper Extremity Assessment ?Upper Extremity Assessment: Generalized weakness ?  ? ?Lower Extremity Assessment ?Lower Extremity Assessment: Generalized weakness ?  ? ?Cervical / Trunk Assessment ?Cervical / Trunk Assessment: Other exceptions (L chest tube)  ?Communication  ? Communication: No difficulties  ?Cognition Arousal/Alertness: Awake/alert ?Behavior During Therapy: Flat affect ?Overall Cognitive Status: Difficult to assess ?  ?  ?  ?  ?  ?  ?  ?  ?  ?  ?  ?  ?  ?  ?  ?  ?General Comments: Following commands. Requiring increased encouragment ?  ?  ? ?  ?General Comments General comments (skin integrity, edema, etc.): HR elevating to 130s. SpO2 93-89% on RA. RR 20-30s. ? ?  ?Exercises    ? ?Assessment/Plan  ?  ?PT Assessment Patient needs continued PT services  ?PT Problem List Decreased strength;Decreased activity tolerance;Decreased balance;Decreased mobility;Decreased cognition;Decreased safety awareness;Pain ? ?   ?  ?PT Treatment Interventions DME instruction;Gait training;Functional mobility training;Therapeutic activities;Therapeutic exercise;Balance training;Patient/family education   ? ?PT Goals (Current goals can be found in the Care Plan section)  ?Acute Rehab PT Goals ?Patient Stated Goal: did not state ?PT Goal Formulation: With patient ?Time For Goal Achievement: 11/25/21 ?Potential to Achieve Goals: Good ? ?  ?Frequency Min 3X/week ?  ? ? ?Co-evaluation PT/OT/SLP Co-Evaluation/Treatment: Yes ?Reason for Co-Treatment: For patient/therapist safety;To address functional/ADL transfers;Other (comment) (poor activity tolerance) ?PT goals addressed during session: Mobility/safety with mobility ?OT goals addressed during session: ADL's and self-care ?  ? ? ?  ?AM-PAC PT "6  Clicks" Mobility  ?Outcome Measure Help needed turning from your back to your side while in a flat bed without using bedrails?: A Little ?Help needed moving from lying on your back to sitting on the side of a flat bed without using bedrails?: A Little ?Help needed moving to and from a bed to a chair (including a wheelchair)?: A Little ?Help needed standing up from a chair using your arms (e.g., wheelchair or bedside chair)?: A Little ?Help needed to walk in hospital room?: A Little ?Help needed climbing 3-5 steps with a railing? : A Lot ?6 Click Score: 17 ? ?  ?End of Session   ?Activity Tolerance: Patient tolerated treatment well ?Patient left: in chair;with call bell/phone within reach;with bed alarm set ?Nurse Communication: Mobility status ?PT Visit Diagnosis: Unsteadiness on feet (R26.81);Difficulty in walking, not elsewhere classified (R26.2) ?  ? ?Time: 1000-1024 ?PT Time Calculation (min) (ACUTE ONLY): 24 min ? ? ?Charges:   PT Evaluation ?$PT Eval Moderate Complexity: 1 Mod ?  ?  ?   ? ? ?Wyona Almas, PT, DPT ?Acute Rehabilitation Services ?Pager [phone] ?Office [phone] ? ? ?Sean Farrell ?11/11/2021, 1:51 PM ? ?

## 2021-11-11 NOTE — Assessment & Plan Note (Deleted)
Not on aspirin or statin. 

## 2021-11-12 ENCOUNTER — Inpatient Hospital Stay (HOSPITAL_COMMUNITY): Payer: Medicare Other

## 2021-11-12 DIAGNOSIS — J181 Lobar pneumonia, unspecified organism: Secondary | ICD-10-CM | POA: Diagnosis not present

## 2021-11-12 DIAGNOSIS — I679 Cerebrovascular disease, unspecified: Secondary | ICD-10-CM | POA: Diagnosis not present

## 2021-11-12 DIAGNOSIS — I5042 Chronic combined systolic (congestive) and diastolic (congestive) heart failure: Secondary | ICD-10-CM | POA: Diagnosis not present

## 2021-11-12 DIAGNOSIS — R000 Tachycardia, unspecified: Secondary | ICD-10-CM | POA: Diagnosis present

## 2021-11-12 DIAGNOSIS — E872 Acidosis, unspecified: Secondary | ICD-10-CM | POA: Diagnosis present

## 2021-11-12 DIAGNOSIS — Z8611 Personal history of tuberculosis: Secondary | ICD-10-CM | POA: Diagnosis not present

## 2021-11-12 LAB — CBC
HCT: 30.6 % — ABNORMAL LOW (ref 39.0–52.0)
Hemoglobin: 10 g/dL — ABNORMAL LOW (ref 13.0–17.0)
MCH: 29.2 pg (ref 26.0–34.0)
MCHC: 32.7 g/dL (ref 30.0–36.0)
MCV: 89.2 fL (ref 80.0–100.0)
Platelets: 455 10*3/uL — ABNORMAL HIGH (ref 150–400)
RBC: 3.43 MIL/uL — ABNORMAL LOW (ref 4.22–5.81)
RDW: 13.9 % (ref 11.5–15.5)
WBC: 40.6 10*3/uL — ABNORMAL HIGH (ref 4.0–10.5)
nRBC: 0 % (ref 0.0–0.2)

## 2021-11-12 LAB — COMPREHENSIVE METABOLIC PANEL
ALT: 16 U/L (ref 0–44)
AST: 44 U/L — ABNORMAL HIGH (ref 15–41)
Albumin: 1.8 g/dL — ABNORMAL LOW (ref 3.5–5.0)
Alkaline Phosphatase: 148 U/L — ABNORMAL HIGH (ref 38–126)
Anion gap: 9 (ref 5–15)
BUN: 8 mg/dL (ref 8–23)
CO2: 19 mmol/L — ABNORMAL LOW (ref 22–32)
Calcium: 8.8 mg/dL — ABNORMAL LOW (ref 8.9–10.3)
Chloride: 105 mmol/L (ref 98–111)
Creatinine, Ser: 0.65 mg/dL (ref 0.61–1.24)
GFR, Estimated: >60 mL/min (ref >60)
Glucose, Bld: 98 mg/dL (ref 70–99)
Potassium: 4.5 mmol/L (ref 3.5–5.1)
Sodium: 133 mmol/L — ABNORMAL LOW (ref 135–145)
Total Bilirubin: 2.3 mg/dL — ABNORMAL HIGH (ref 0.3–1.2)
Total Protein: 5.9 g/dL — ABNORMAL LOW (ref 6.5–8.1)

## 2021-11-12 MED ORDER — BOOST / RESOURCE BREEZE PO LIQD CUSTOM
1.0000 | Freq: Three times a day (TID) | ORAL | Status: DC
  Administered 2021-11-12 (×2): 1 via ORAL

## 2021-11-12 MED ORDER — STERILE WATER FOR INJECTION IJ SOLN
5.0000 mg | Freq: Once | RESPIRATORY_TRACT | Status: AC
  Administered 2021-11-12: 5 mg via INTRAPLEURAL
  Filled 2021-11-12: qty 5

## 2021-11-12 MED ORDER — PIPERACILLIN-TAZOBACTAM 3.375 G IVPB
3.3750 g | Freq: Three times a day (TID) | INTRAVENOUS | Status: DC
  Administered 2021-11-12 – 2021-11-20 (×23): 3.375 g via INTRAVENOUS
  Filled 2021-11-12 (×27): qty 50

## 2021-11-12 MED ORDER — SODIUM CHLORIDE (PF) 0.9 % IJ SOLN
10.0000 mg | Freq: Once | INTRAMUSCULAR | Status: AC
  Administered 2021-11-12: 10 mg via INTRAPLEURAL
  Filled 2021-11-12: qty 10

## 2021-11-12 NOTE — Procedures (Signed)
Pleural Fibrinolytic Administration Procedure Note ? ?Garfield Cornea  ?[id number]  ?07/18/1945 ? ?Date:11/12/21  ?Time:1:18 PM  ? ?Provider Performing:Donta Mcinroy C Tamala Julian  ? ?Procedure: Pleural Fibrinolysis Subsequent day (02725) ? ?Indication(s) ?Fibrinolysis of complicated pleural effusion ? ?Consent ?Risks of the procedure as well as the alternatives and risks of each were explained to the patient and/or caregiver.  Consent for the procedure was obtained. ? ? ?Anesthesia ?None ? ? ?Time Out ?Verified patient identification, verified procedure, site/side was marked, verified correct patient position, special equipment/implants available, medications/allergies/relevant history reviewed, required imaging and test results available. ? ? ?Sterile Technique ?Hand hygiene, gloves ? ? ?Procedure Description ?Existing pleural catheter was cleaned and accessed in sterile manner.  '10mg'$  of tPA in 30cc of saline and '5mg'$  of dornase in 30cc of sterile water were injected into pleural space using existing pleural catheter.  Catheter will be clamped for 1 hour and then placed back to suction. ? ? ?Complications/Tolerance ?None; patient tolerated the procedure well. ? ?EBL ?None ? ? ?Specimen(s) ?None ? ?

## 2021-11-12 NOTE — Assessment & Plan Note (Deleted)
No evidence of obstruction on CT.  Trend not concerning, LFTs stable.  ?

## 2021-11-12 NOTE — Assessment & Plan Note (Deleted)
No change 

## 2021-11-12 NOTE — Progress Notes (Signed)
?Progress Note ? ? ?Patient: Sean Farrell GNF:[id number] DOB: 04-12-1946 DOA: 11/10/2021     2 ?DOS: the patient was seen and examined on 11/12/2021 at 8:45AM ?  ? ? ? ?Brief hospital course: ?Sean Farrell is a 76 y.o. M with hx stroke, smoking, hx TB in 2010, and chronic heart failure EF 40-45% who presented with cough, SOB and night sweats for maybe a month. ? ?In the ER, CXR showed cavitary PNA, effusion. ? ? ?5/5: Admitted on airborne precautions, chest tube placed, Pulm consulted, CT showing multiple liver lesions, bony lytic lesions, bilateral lung nodules, no obvious primary ?5/6: Obtaining CT abd/pel --> shows pancreatic tail mass; tPA/dornase x1 ?5/7: IR consulted for liver mass biopsy ? ? ? ? ?Assessment and Plan: ?* Lung consolidation ([place]) ?CT chest with left loculated effusion, moderate in size, no significant pneumonia, old granulomatous disease but also several new small nodules concerning for metastases and a nodular component to pleural disease.  RVP negative.  HIV negative. ? ?Given subacute presentation, night sweats, cough, prior TB, will want to rule out active TB ?- AFB smear x3 to rule out TB ? ?WBC trending up ?- Continue Rocephin/Doxy ? ?Given this all may be cancer (pulmonary nodules and effusion) ?- We will need to pursue biopsy, probable liver lesion ?-Follow pleural fluid cytology ? ?Loculated pleural effusion ?Exudative by 2/3 lights.   ?- Consult Pulmonology for chest tube mgmt, lytics ?- Follow Pleural culture and cytology ? ?History of tuberculosis ?- Morning AFB smear x3 ?- Airborne until TB ruled out ? ?Liver lesions and lytic bony lesions ?See above.  CT abdomen pelvis from yesterday shows a pancreatic tail mass, retroperitoneal node. ? ?Strong suspicion overall presenting syndrome is due to metastatic pancreatic cancer. ?-Consult IR for biopsy ? ?Sinus tachycardia ?Due to acidosis, suspected cancer.  No improvement with fluids ?- Stop fluids ? ?Hyperbilirubinemia ?No evidence of  obstruction on CT ?- Trend LFTs ? ?Metabolic acidosis ?Non-gap.    ?- Hold fluids  ?- Monitor stool output ? ?Normocytic anemia ?Hgb mildly low, stable, no clinical bleeding ? ?Cerebrovascular disease ?Not on aspirin or statin ? ?Chronic combined systolic (congestive) and diastolic (congestive) heart failure (HCC) ?EF 40-45% in the past, no evidence of fluid overload here.  Not on diuretic or ARB or spiro. ?- Continue metoprolol ? ?Tobacco abuse ?  ? ?Hyponatremia ?- Hold fluids ? ?Hypertension ?BP controlled ?- Continue amlodipine, metoprolol ? ? ? ? ? ? ? ? ? ? ?Subjective: Patient is some right-sided chest pain with coughing.  No fever, no confusion, no hallucinations.  No respiratory distress. ? ? ? ? ?Physical Exam: ?Vitals:  ? 11/12/21 0600 11/12/21 0700 11/12/21 0800 11/12/21 0806  ?BP: (!) 152/70  (!) 142/68 140/72  ?Pulse: (!) 102 (!) 115 93 93  ?Resp: (!) 21 (!) '27 20 20  '$ ?Temp:   99.7 ?F (37.6 ?C) 99.7 ?F (37.6 ?C)  ?TempSrc:   Oral Oral  ?SpO2: (!) 86% 93% 94% 95%  ?Weight:      ?Height:      ? ?Thin adult male, sitting in the edge of the bed, eating breakfast, not much restricted eating, interactive but very tired appearing ?Tachycardic, regular, no murmurs ?Respiratory effort shallow and somewhat fast, diminished on left, less on right, no real rales, and no wheezing ?Attention normal, tired, oriented, face symmetric. ? ? ? ? ?Data Reviewed: ?Nursing notes reviewed, vital signs reviewed ?Complete blood count shows low sodium, otherwise creatinine and electrolytes stable ?Total bilirubin  trending up ?CT abdomen and pelvis report reviewed ? ? ? ? ?Family Communication:  ? ? ? ?Disposition: ?Status is: Inpatient ? ? ? ? ? ? ? ? ?Author: ?Edwin Dada, MD ?11/12/2021 8:54 AM ? ?For on call review [email or web address].  ? ? ?

## 2021-11-12 NOTE — Assessment & Plan Note (Deleted)
Non-gap.   Improved.  Likely from IV fluids.  ?

## 2021-11-12 NOTE — Assessment & Plan Note (Deleted)
Due to acidosis, suspected cancer, abscess.  No improvement with fluids ? ?

## 2021-11-12 NOTE — Consult Note (Signed)
? ?Chief Complaint: ?Patient was seen in consultation today for  ?Chief Complaint  ?Patient presents with  ? Shortness of Breath  ? Back Pain  ? Dehydration  ? at the request of Dr. Myrene Buddy ? ?Referring Physician(s): ?Norina Buzzard, MD ? ?Supervising Physician: Sandi Mariscal ? ?Patient Status: [facility] - In-pt ? ?History of Present Illness: ?BARRETT GOLDIE is a 76 y.o. male with hx stroke, smoking, hx TB in 2010, and chronic heart failure EF 40-45% who presented with cough, SOB and night sweats for maybe a month. ?He was admitted, had chest tube placed and has been having lytic administration. ?Imaging reveals pancreatic tail mass and hepatic lesions.  Given leukocytoisis, these hepatic lesions may represent abscesses vs metastatic disease.  IR has been consulted for sampling. ? ?Past Medical History:  ?Diagnosis Date  ? Hypertension   ? Marijuana abuse   ? Tobacco abuse   ? Tuberculosis 2010  ? ? ?Past Surgical History:  ?Procedure Laterality Date  ? Left ankle surgery    ? ? ?Allergies: ?Patient has no known allergies. ? ?Medications: ?Prior to Admission medications   ?Medication Sig Start Date End Date Taking? Authorizing Provider  ?amLODipine (NORVASC) 5 MG tablet Take 5 mg by mouth daily. 10/26/21  Yes [provider]  ?diclofenac (VOLTAREN) 75 MG EC tablet Take 75 mg by mouth 2 (two) times daily as needed for pain. 10/27/21  Yes [provider]  ?metoprolol tartrate (LOPRESSOR) 25 MG tablet Take 0.5 tablets (12.5 mg total) by mouth 2 (two) times daily. ?Patient taking differently: Take 25 mg by mouth 2 (two) times daily. 07/09/17  Yes Alma Friendly, MD  ?SPIRIVA HANDIHALER 18 MCG inhalation capsule Place 1 capsule into inhaler and inhale daily. 07/31/21  Yes [provider]  ?tiZANidine (ZANAFLEX) 4 MG tablet Take 4 mg by mouth at bedtime as needed for muscle spasms. 05/09/17  Yes [provider]  ?zolpidem (AMBIEN) 5 MG tablet Take 5 mg by mouth at bedtime.  05/09/17  Yes [provider]  ?  ? ?Family History  ?Problem Relation Age of Onset  ? Breast cancer Mother   ? Alcoholism Father   ? Breast cancer Sister   ? ? ?Social History  ? ?Socioeconomic History  ? Marital status: Divorced  ?  Spouse name: Not on file  ? Number of children: Not on file  ? Years of education: Not on file  ? Highest education level: Not on file  ?Occupational History  ? Occupation: retired  ?Tobacco Use  ? Smoking status: Every Day  ?  Packs/day: 1.00  ?  Years: 54.00  ?  Pack years: 54.00  ?  Types: Cigarettes  ? Smokeless tobacco: Current  ?Substance and Sexual Activity  ? Alcohol use: No  ? Drug use: Yes  ?  Types: Marijuana  ? Sexual activity: Not on file  ?Other Topics Concern  ? Not on file  ?Social History Narrative  ? Not on file  ? ?Social Determinants of Health  ? ?Financial Resource Strain: Not on file  ?Food Insecurity: Not on file  ?Transportation Needs: Not on file  ?Physical Activity: Not on file  ?Stress: Not on file  ?Social Connections: Not on file  ? ? ?Review of Systems  ?Constitutional:  Positive for activity change, appetite change and fatigue.  ?HENT:  Positive for congestion.   ?Respiratory:  Positive for shortness of breath.   ?Musculoskeletal:  Positive for back pain.  ? ?Vital Signs: ?BP 126/68  Pulse 90   Temp 99.6 ?F (37.6 ?C) (Oral)   Resp 20   Ht '6\' 2"'$  (1.88 m)   Wt 170 lb (77.1 kg)   SpO2 93%   BMI 21.83 kg/m?  ? ?Physical Exam ?Constitutional:   ?   General: He is not in acute distress. ?   Appearance: He is ill-appearing.  ?HENT:  ?   Head: Normocephalic and atraumatic.  ?Eyes:  ?   Extraocular Movements: Extraocular movements intact.  ?Cardiovascular:  ?   Rate and Rhythm: Normal rate.  ?Pulmonary:  ?   Effort: Pulmonary effort is normal.  ?Neurological:  ?   General: No focal deficit present.  ?   Mental Status: He is alert.  ? ? ?Imaging: ?DG Chest 2 View ? ?Result Date: 11/10/2021 ?CLINICAL DATA:  76 year old male with shortness of breath.  Dehydration. EXAM: CHEST - 2 VIEW COMPARISON:  Chest radiographs 09/05/2016 and earlier. FINDINGS: Abnormal left lung base in 2018 with mild patchy opacity at that time. But now a moderate sized area of confluent opacity throughout the left mid and lower lung most resembles a loculated pleural effusion. Only a small volume of aerated left lung base. Chronic elevation of the left hemidiaphragm. Small contralateral right pleural effusion. Some pleural fluid tracking in the fissures on the lateral view. No pneumothorax or pulmonary edema. Mediastinal contours which are not obscured appear stable and within normal limits. No acute osseous abnormality identified. Negative visible bowel gas. IMPRESSION: 1. Progressed abnormal left lung base since 2018 radiographs, felt at least in part related to pleural effusion with loculation. But otherwise indeterminate and recommend Chest CT with IV contrast to further characterize. 2. Small right pleural effusion is also new. Electronically Signed   By: Genevie Ann M.D.   On: 11/10/2021 09:08  ? ?CT Chest W Contrast ? ?Result Date: 11/10/2021 ?CLINICAL DATA:  Shortness of breath and back pain.  History of TB. EXAM: CT CHEST WITH CONTRAST TECHNIQUE: Multidetector CT imaging of the chest was performed during intravenous contrast administration. RADIATION DOSE REDUCTION: This exam was performed according to the departmental dose-optimization program which includes automated exposure control, adjustment of the mA and/or kV according to patient size and/or use of iterative reconstruction technique. CONTRAST:  64m OMNIPAQUE IOHEXOL 300 MG/ML  SOLN COMPARISON:  Chest x-ray from same day. CT chest dated September 17, 2008. FINDINGS: Cardiovascular: No significant vascular findings. Normal heart size. No pericardial effusion. No thoracic aortic aneurysm or dissection. Coronary, aortic arch, and branch vessel atherosclerotic vascular disease. No central pulmonary embolism. Mediastinum/Nodes: No  enlarged mediastinal, hilar, or axillary lymph nodes. Thyroid gland, trachea, and esophagus demonstrate no significant findings. Lungs/Pleura: Moderate loculated left pleural effusion with partial collapse of the lingula and left lower lobe. Small right pleural effusion. Several small solid pulmonary nodules in both lungs measuring up to 5 mm nodule in the left upper lobe (series 3, image 40). Nodular scarring and calcification in the right upper and lower lobes. Mild centrilobular and paraseptal emphysema. No consolidation or pneumothorax. Upper Abdomen: Multiple centrally hypodense heterogeneously enhancing liver lesions measuring up to 6.9 cm Musculoskeletal: Multiple small lytic lesions scattered throughout the thoracic spine and bilateral ribs, new since 2010. IMPRESSION: 1. Constellation of findings consistent with metastatic disease including multiple liver lesions and multiple bony lytic lesions in the thoracic spine and bilateral ribs. No definite primary lung malignancy. Multiple small solid noncalcified pulmonary nodules in both lungs measuring up to 5 mm are indeterminate, but could represent metastatic disease as  well. 2. Moderate loculated left pleural effusion with partial collapse of the lingula and left lower lobe. Small right pleural effusion. 3. Sequelae of prior granulomatous disease in the right lung. 4. Aortic Atherosclerosis (ICD10-I70.0) and Emphysema (ICD10-J43.9). Electronically Signed   By: Titus Dubin M.D.   On: 11/10/2021 12:02  ? ?CT ABDOMEN PELVIS W CONTRAST ? ?Result Date: 11/11/2021 ?CLINICAL DATA:  Metastatic disease evaluation; * Tracking Code: BO * EXAM: CT ABDOMEN AND PELVIS WITH CONTRAST TECHNIQUE: Multidetector CT imaging of the abdomen and pelvis was performed using the standard protocol following bolus administration of intravenous contrast. RADIATION DOSE REDUCTION: This exam was performed according to the departmental dose-optimization program which includes automated  exposure control, adjustment of the mA and/or kV according to patient size and/or use of iterative reconstruction technique. CONTRAST:  16m OMNIPAQUE IOHEXOL 300 MG/ML  SOLN COMPARISON:  None Available. FINDINGS: Lower c

## 2021-11-12 NOTE — Progress Notes (Signed)
? ?  NAME:  Sean Farrell, MRN:  [id number], DOB:  1945/12/13, LOS: 2 ?ADMISSION DATE:  11/10/2021, CONSULTATION DATE:  11/10/21 ?REFERRING MD:  Sherren Mocha, MD CHIEF COMPLAINT:  Pneumonia  ? 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 He reports decreased appetite over recent months and has noted night sweats. He denies fevers and chills. He smokes cigarettes and marijuana regularly.  ? ?PCCM has been consulted for loculated pleural effusion and concern for TB.  ? ?Pertinent  Medical History  ?Hypertension ?Tobacco Abuse ?Mycobacterium Tuberculosis 2010 ?Hepatitis C ?Polysubstance Use ? ?Significant Hospital Events: ?Including procedures, antibiotic start and stop dates in addition to other pertinent events   ?5/5 admitted, left pigtail chest tube placed ? ?Interim History / Subjective:  ?Not eating much.  No appetite.  Had some response to TPA/dornase yesterday. ?Thinks breathing is slightly better. ? ?Objective   ?Blood pressure 126/68, pulse 90, temperature 99.6 ?F (37.6 ?C), temperature source Oral, resp. rate 20, height '6\' 2"'$  (1.88 m), weight 77.1 kg, SpO2 93 %. ?   ?   ? ?Intake/Output Summary (Last 24 hours) at 11/12/2021 1315 ?Last data filed at 11/12/2021 1100 ?Gross per 24 hour  ?Intake 2740.02 ml  ?Output 2420 ml  ?Net 320.02 ml  ? ? ?Filed Weights  ? 11/10/21 0837  ?Weight: 77.1 kg  ? ? ?Examination: ?Chronically ill man laying in bed ?Lungs diminished particulatly on L ?Chest tube without air leak or tidaling, dark fluid in atrium ?Ext with muscle wasting ?Aox3 ? ?CXR mildly improved aeration ? ?Resolved Hospital Problem list   ? ? ?Assessment & Plan:  ?Left Loculated Pleural Effusion ?Multiple Pulmonary Nodules ?Concern for metastatic disease with multiple Liver  Lesions and multiple bony lytic lesions in thoracic spin and bilateral ribs ?Severe Protein Calorie Malnutrition ? ?Discussion: ?Left loculated effusion differential includes malignancy vs infectious etiology.  ? ?Plan: ?- Left chest tube to -20cmH2O, flush chest tube qshift ?- Repeat TPA/DNase treatment ?- Follow cultures and cytology ?- Agree with IR guided biopsy ?- Will add boost to diet at patient request ? ?Best Practice (right click and "Reselect all SmartList Selections" daily)  ? ?Per Primary ? ?Critical care time:   ? ?Marshell Garfinkel MD ?[facility] ?See Amion for pager ? ?If no response to pager , please call [phone] until 7pm ?After 7:00 pm call Elink  [phone] ?11/12/2021, 1:15 PM  ? ?  ?

## 2021-11-13 ENCOUNTER — Inpatient Hospital Stay (HOSPITAL_COMMUNITY): Payer: Medicare Other

## 2021-11-13 DIAGNOSIS — I5042 Chronic combined systolic (congestive) and diastolic (congestive) heart failure: Secondary | ICD-10-CM | POA: Diagnosis not present

## 2021-11-13 DIAGNOSIS — Z8611 Personal history of tuberculosis: Secondary | ICD-10-CM | POA: Diagnosis not present

## 2021-11-13 DIAGNOSIS — I679 Cerebrovascular disease, unspecified: Secondary | ICD-10-CM | POA: Diagnosis not present

## 2021-11-13 DIAGNOSIS — J181 Lobar pneumonia, unspecified organism: Secondary | ICD-10-CM | POA: Diagnosis not present

## 2021-11-13 LAB — CBC
HCT: 29.9 % — ABNORMAL LOW (ref 39.0–52.0)
Hemoglobin: 9.9 g/dL — ABNORMAL LOW (ref 13.0–17.0)
MCH: 29.3 pg (ref 26.0–34.0)
MCHC: 33.1 g/dL (ref 30.0–36.0)
MCV: 88.5 fL (ref 80.0–100.0)
Platelets: 456 10*3/uL — ABNORMAL HIGH (ref 150–400)
RBC: 3.38 MIL/uL — ABNORMAL LOW (ref 4.22–5.81)
RDW: 13.7 % (ref 11.5–15.5)
WBC: 34.6 10*3/uL — ABNORMAL HIGH (ref 4.0–10.5)
nRBC: 0 % (ref 0.0–0.2)

## 2021-11-13 LAB — COMPREHENSIVE METABOLIC PANEL
ALT: 16 U/L (ref 0–44)
AST: 49 U/L — ABNORMAL HIGH (ref 15–41)
Albumin: 2 g/dL — ABNORMAL LOW (ref 3.5–5.0)
Alkaline Phosphatase: 145 U/L — ABNORMAL HIGH (ref 38–126)
Anion gap: 8 (ref 5–15)
BUN: 11 mg/dL (ref 8–23)
CO2: 19 mmol/L — ABNORMAL LOW (ref 22–32)
Calcium: 8.8 mg/dL — ABNORMAL LOW (ref 8.9–10.3)
Chloride: 105 mmol/L (ref 98–111)
Creatinine, Ser: 0.62 mg/dL (ref 0.61–1.24)
GFR, Estimated: >60 mL/min (ref >60)
Glucose, Bld: 115 mg/dL — ABNORMAL HIGH (ref 70–99)
Potassium: 3.8 mmol/L (ref 3.5–5.1)
Sodium: 132 mmol/L — ABNORMAL LOW (ref 135–145)
Total Bilirubin: 2.3 mg/dL — ABNORMAL HIGH (ref 0.3–1.2)
Total Protein: 6.5 g/dL (ref 6.5–8.1)

## 2021-11-13 LAB — BODY FLUID CULTURE W GRAM STAIN
Culture: NO GROWTH
Gram Stain: NONE SEEN

## 2021-11-13 LAB — PSA: Prostatic Specific Antigen: 3 ng/mL (ref 0.00–4.00)

## 2021-11-13 MED ORDER — ENSURE ENLIVE PO LIQD
237.0000 mL | Freq: Three times a day (TID) | ORAL | Status: DC
  Administered 2021-11-13 – 2021-11-25 (×24): 237 mL via ORAL

## 2021-11-13 MED ORDER — ADULT MULTIVITAMIN W/MINERALS CH
1.0000 | ORAL_TABLET | Freq: Every day | ORAL | Status: DC
  Administered 2021-11-13 – 2021-11-25 (×12): 1 via ORAL
  Filled 2021-11-13 (×13): qty 1

## 2021-11-13 NOTE — Plan of Care (Signed)
?  Problem: Activity: ?Goal: Ability to tolerate increased activity will improve ?Outcome: Progressing ?  ?Problem: Clinical Measurements: ?Goal: Ability to maintain a body temperature in the normal range will improve ?Outcome: Progressing ?  ?Problem: Respiratory: ?Goal: Ability to maintain adequate ventilation will improve ?Outcome: Progressing ?Goal: Ability to maintain a clear airway will improve ?Outcome: Progressing ?  ?Problem: Education: ?Goal: Knowledge of General Education information will improve ?Description: Including pain rating scale, medication(s)/side effects and non-pharmacologic comfort measures ?Outcome: Progressing ?  ?Problem: Health Behavior/Discharge Planning: ?Goal: Ability to manage health-related needs will improve ?Outcome: Progressing ?  ?Problem: Clinical Measurements: ?Goal: Ability to maintain clinical measurements within normal limits will improve ?Outcome: Progressing ?  ?

## 2021-11-13 NOTE — Progress Notes (Signed)
Occupational Therapy Treatment ?Patient Details ?Name: Sean Farrell ?MRN: [id number] ?DOB: [birth year] ?Today's Date: 11/13/2021 ? ? ?History of present illness 76 yo male presenting to ED on 5/5 with SOB and back pain. Chest CT showing concern for large cavitary lesion in the right upper lobe abutting the mediastinum. S/p L pigtail chest tube placed on 5/5. PMH including HTN, polysubstance abuse, mycobacterium tuberculosis  (2010), CHF, Hep C, and L ankle sx. ?  ?OT comments ? Patient received in bed and was min assist to get to EOB and mod assist to changed from regular socks to non-skid socks. Patient was min assist to stand from EOB to get weight on scales and returned to EOB. Patient instructed to stand at sink to perform grooming tasks and declined stating he wasn't doing anything today because he didn't get any sleep last night. Patient encouraged to participate and educated on benefit of therapy. Patient also declined getting into recliner for lunch. Acute OT to continue to follow.   ? ?Recommendations for follow up therapy are one component of a multi-disciplinary discharge planning process, led by the attending physician.  Recommendations may be updated based on patient status, additional functional criteria and insurance authorization. ?   ?Follow Up Recommendations ? Home health OT  ?  ?Assistance Recommended at Discharge Frequent or constant Supervision/Assistance  ?Patient can return home with the following ? A little help with walking and/or transfers;A little help with bathing/dressing/bathroom;Assistance with cooking/housework ?  ?Equipment Recommendations ? None recommended by OT  ?  ?Recommendations for Other Services   ? ?  ?Precautions / Restrictions Precautions ?Precautions: Fall;Other (comment) ?Precaution Comments: chest tube L ?Restrictions ?Weight Bearing Restrictions: No  ? ? ?  ? ?Mobility Bed Mobility ?Overal bed mobility: Needs Assistance ?Bed Mobility: Supine to Sit, Sit to Supine ?  ?   ?Supine to sit: Min assist, HOB elevated ?Sit to supine: Supervision ?  ?General bed mobility comments: min assist to get to EOB but able to get back to supine with supervision ?  ? ?Transfers ?Overall transfer level: Needs assistance ?Equipment used: None ?Transfers: Sit to/from Stand ?Sit to Stand: Min assist ?  ?  ?  ?  ?  ?General transfer comment: stood from EOB to get weighted on scales and returned to EOB ?  ?  ?Balance Overall balance assessment: Needs assistance ?Sitting-balance support: No upper extremity supported, Feet supported ?Sitting balance-Leahy Scale: Good ?  ?  ?Standing balance support: No upper extremity supported, During functional activity ?Standing balance-Leahy Scale: Fair ?  ?  ?  ?  ?  ?  ?  ?  ?  ?  ?  ?  ?   ? ?ADL either performed or assessed with clinical judgement  ? ?ADL Overall ADL's : Needs assistance/impaired ?Eating/Feeding: Set up ?  ?  ?  ?  ?  ?  ?  ?  ?  ?Lower Body Dressing: Moderate assistance;Sit to/from stand ?Lower Body Dressing Details (indicate cue type and reason): doffing socks and donning non -skid socks ?  ?  ?  ?  ?  ?  ?  ?General ADL Comments: declined staning at sink or performing other grooming tasks ?  ? ?Extremity/Trunk Assessment   ?  ?  ?  ?  ?  ? ?Vision   ?  ?  ?Perception   ?  ?Praxis   ?  ? ?Cognition Arousal/Alertness: Awake/alert ?Behavior During Therapy: Flat affect ?Overall Cognitive Status: Difficult to assess ?  ?  ?  ?  ?  ?  ?  ?  ?  ?  ?  ?  ?  ?  ?  ?  ?  General Comments: required cues for encouragement, stated did not sleep well and too tired to do much therapy ?  ?  ?   ?Exercises   ? ?  ?Shoulder Instructions   ? ? ?  ?General Comments    ? ? ?Pertinent Vitals/ Pain       Pain Assessment ?Pain Assessment: Faces ?Faces Pain Scale: Hurts even more ?Pain Location: chest with coughing ?Pain Descriptors / Indicators: Discomfort, Guarding, Grimacing ?Pain Intervention(s): Monitored during session ? ?Home Living   ?  ?  ?  ?  ?  ?  ?  ?  ?  ?  ?   ?  ?  ?  ?  ?  ?  ?  ? ?  ?Prior Functioning/Environment    ?  ?  ?  ?   ? ?Frequency ? Min 2X/week  ? ? ? ? ?  ?Progress Toward Goals ? ?OT Goals(current goals can now be found in the care plan section) ? Progress towards OT goals: Not progressing toward goals - comment (limited participation from patient) ? ?Acute Rehab OT Goals ?Patient Stated Goal: go home ?OT Goal Formulation: With patient ?Time For Goal Achievement: 11/25/21 ?Potential to Achieve Goals: Good ?ADL Goals ?Pt Will Perform Upper Body Dressing: with set-up;with supervision;sitting ?Pt Will Perform Lower Body Dressing: with min guard assist;sitting/lateral leans;with adaptive equipment ?Pt Will Transfer to Toilet: with min guard assist;ambulating;regular height toilet ?Pt Will Perform Toileting - Clothing Manipulation and hygiene: with min guard assist;sitting/lateral leans;sit to/from stand  ?Plan Discharge plan remains appropriate   ? ?Co-evaluation ? ? ?   ?  ?  ?  ?  ? ?  ?AM-PAC OT "6 Clicks" Daily Activity     ?Outcome Measure ? ? Help from another person eating meals?: None ?Help from another person taking care of personal grooming?: A Little ?Help from another person toileting, which includes using toliet, bedpan, or urinal?: A Little ?Help from another person bathing (including washing, rinsing, drying)?: A Lot ?Help from another person to put on and taking off regular upper body clothing?: A Little ?Help from another person to put on and taking off regular lower body clothing?: A Lot ?6 Click Score: 17 ? ?  ?End of Session   ? ?OT Visit Diagnosis: Unsteadiness on feet (R26.81);Other abnormalities of gait and mobility (R26.89);Muscle weakness (generalized) (M62.81) ?  ?Activity Tolerance Patient limited by fatigue ?  ?Patient Left in chair;with call bell/phone within reach ?  ?Nurse Communication Mobility status ?  ? ?   ? ?Time: 8341-9622 ?OT Time Calculation (min): 21 min ? ?Charges: OT General Charges ?$OT Visit: 1 Visit ?OT  Treatments ?$Therapeutic Activity: 8-22 mins ? ?Lodema Hong, OTA ?Acute Rehabilitation Services  ?Pager [phone] ?Office [phone] ? ? ?[place] ?11/13/2021, 2:36 PM ?

## 2021-11-13 NOTE — Progress Notes (Signed)
? ?  NAME:  Sean Farrell, MRN:  [id number], DOB:  [birth year], LOS: 3 ?ADMISSION DATE:  11/10/2021, CONSULTATION DATE:  11/10/21 ?REFERRING MD:  Sherren Mocha, MD CHIEF COMPLAINT:  Pneumonia  ? 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 He reports decreased appetite over recent months and has noted night sweats. He denies fevers and chills. He smokes cigarettes and marijuana regularly.  ? ?PCCM has been consulted for loculated pleural effusion and concern for TB.  ? ?Pertinent  Medical History  ?Hypertension ?Tobacco Abuse ?Mycobacterium Tuberculosis 2010 ?Hepatitis C ?Polysubstance Use ? ?Significant Hospital Events: ?Including procedures, antibiotic start and stop dates in addition to other pertinent events   ?5/5 admitted, left pigtail chest tube placed ? ?Interim History / Subjective:  ? ?Says he feels a little better since coming into hospital ?No major change otherwise ? ?Objective   ?Blood pressure 133/61, pulse 96, temperature 98.9 ?F (37.2 ?C), temperature source Oral, resp. rate 18, height '6\' 2"'$  (1.88 m), weight 73.9 kg, SpO2 96 %. ?   ?   ? ?Intake/Output Summary (Last 24 hours) at 11/13/2021 1409 ?Last data filed at 11/13/2021 1200 ?Gross per 24 hour  ?Intake 602.36 ml  ?Output 1200 ml  ?Net -597.64 ml  ? ?Filed Weights  ? 11/10/21 0837 11/13/21 1000  ?Weight: 77.1 kg 73.9 kg  ? ? ?Examination: ? ?General:  Chronically ill appearing, resting comfortably in bed ?HENT: NCAT OP clear ?PULM: CTA B, normal effort ?CV: RRR, no mgr ?GI: BS+, soft, nontender ?MSK: normal bulk and tone ?Neuro: awake, alert, no distress, MAEW ? ? ?CXR personally reviewed: small pleural effusion on the left, pigtail chest tube in place, ? kinked ? ?Resolved Hospital Problem list   ? ? 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 ?Discussion: ?Left loculated effusion differential includes malignancy vs infectious etiology.  ?Will ask lab to comment on mesothelial cell presence and consider sending ADA. ?Quantiferon gold pending ? ?Plan: ?-IR biopsy of liver lesions pending ?-hold of on TPA/Dornase today ?-will ask lab to comment on presence of mesothelial cells in fluid, if negative will send pleural fluid ADA ?-follow pleural fluid cultures and cytology ? ?Best Practice (right click and "Reselect all SmartList Selections" daily)  ? ?Per Primary ? ?Critical care time:   ? ?Roselie Awkward, MD ?[facility] ?Pager: [phone] ?Cell: [phone] ?After 7:00 pm call Elink  [phone] ? ?11/13/2021, 2:09 PM  ? ?  ?

## 2021-11-13 NOTE — Progress Notes (Signed)
Initial Nutrition Assessment ? ?DOCUMENTATION CODES:  ? ?Not applicable, suspect malnutrition but unable to confirm at this time ? ?INTERVENTION:  ? ?- Obtain updated weight ? ?- Ensure Enlive po TID, each supplement provides 350 kcal and 20 grams of protein ? ?- Magic Cup TID with meals, each supplement provides 290 kcal and 9 grams of protein ? ?- d/c Boost Breeze ? ?- MVI with minerals daily ? ?- Encourage PO intake ? ?NUTRITION DIAGNOSIS:  ? ?Inadequate oral intake related to poor appetite as evidenced by per patient/family report. ? ?GOAL:  ? ?Patient will meet greater than or equal to 90% of their needs ? ?MONITOR:  ? ?PO intake, Supplement acceptance, Labs, Weight trends ? ?REASON FOR ASSESSMENT:  ? ?Consult ?Assessment of nutrition requirement/status ? ?ASSESSMENT:  ? ?76 year old male who presented to the ED on 5/05 with SOB, back pain, dehydration. PMH of tuberculosis (2012), hepatitis C, polysubstance abuse, HTN, CHF. Pt admitted with PNA, cavitary lesion in LLL. ? ?05/05 - s/p L chest tube placement ? ?RD working remotely. ? ?Per notes, chest CT indicates concern for metastatic disease with uncertain primary. 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 Plan for IR biopsy of liver lesions today. ? ?Spoke with pt via phone call to room. Pt reports that he has not had breakfast because "they told me I couldn't eat this morning." Noted pt is supposed to be NPO for IR biopsy. Regular diet is still ordered but pt has not eaten this morning. ? ?Pt reports that he lost his appetite about 3 weeks PTA. He states that he was not eating full meals but was "snacking" throughout the day. Pt reports eating much less than he used to eat. ? ?Pt endorses weight loss but is unsure how much. Pt reports his UBW range as 170-180 lbs. Weight on admission appears to be stated rather than measured. RD ordered updated weight to assess for trends. Per nursing edema assessment, pt with non-pitting edema to  BLE. This may be masking additional weight loss. ? ?Pt reports that he likes the [facility] that have been ordered for him but would prefer a milky supplement like Ensure. RD will adjust orders. Will also add Magic Cups with meals as well as daily MVI. ? ?Strongly suspect pt with malnutrition but unable to confirm at this time without NFPE. Will complete NFPE at follow-up. ? ?Meal Completion: 0-80% x 4 documented meals ? ?Medications reviewed and include: colace, Boost Breeze TID, IV abx ? ?Labs reviewed: sodium 132, WBC 34.6, hemoglobin 9.9 ? ?UOP: 1200 ml x 24 hours ?CT: 210 ml x 24 hours ?I/O's: -625 ml since admit ? ?NUTRITION - FOCUSED PHYSICAL EXAM: ? ?Unable to complete at this time. RD working remotely. ? ?Diet Order:   ?Diet Order   ? ?       ?  Diet regular Room service appropriate? Yes; Fluid consistency: Thin  Diet effective now       ?  ? ?  ?  ? ?  ? ? ?EDUCATION NEEDS:  ? ?Education needs have been addressed ? ?Skin:  Skin Assessment: Reviewed RN Assessment ? ?Last BM:  11/12/21 large type 6 ? ?Height:  ? ?Ht Readings from Last 1 Encounters:  ?11/10/21 '6\' 2"'$  (1.88 m)  ? ? ?Weight:  ? ?Wt Readings from Last 1 Encounters:  ?11/10/21 77.1 kg  ? ? ?BMI:  Body mass index is 21.83 kg/m?. ? ?Estimated Nutritional Needs:  ? ?Kcal:  2200-2400 ? ?Protein:  110-125 grams ? ?Fluid:  >/= 2.0 L ? ? ? ?Gustavus Bryant, MS, RD, LDN ?Inpatient Clinical Dietitian ?Please see AMiON for contact information. ? ?

## 2021-11-13 NOTE — Progress Notes (Signed)
Physical Therapy Treatment ?Patient Details ?Name: Sean Farrell ?MRN: [id number] ?DOB: 09-06-1945 ?Today's Date: 11/13/2021 ? ? ?History of Present Illness 76 yo male presenting to ED on 5/5 with SOB and back pain. Chest CT showing concern for large cavitary lesion in the right upper lobe abutting the mediastinum. S/p L pigtail chest tube placed on 5/5. Awaiting liver biopsy.  PMH including HTN, polysubstance abuse, mycobacterium tuberculosis  (2010), CHF, Hep C, and L ankle sx. ? ?  ?PT Comments  ? ? Patient progressing slowly towards PT goals. Reports feeling tired and wants to go home. Requires max encouragement and coaxing to participate in PT session. Session focused on progressive ambulation and mobility. Requires min A for balance during ambulation using walking stick present in room. Bil knee instability noted but no buckling. 2-3/4 DOE with HR up to 135 bpm with activity, Sp02 poor reading but likely dropping to high 80s on RA, RR 30s. Flat affect throughout session. Pt is a high fall risk and would benefit from using RW over the walking stick however pt prefers his walking stick. Will follow and progress as tolerated. ?  ?Recommendations for follow up therapy are one component of a multi-disciplinary discharge planning process, led by the attending physician.  Recommendations may be updated based on patient status, additional functional criteria and insurance authorization. ? ?Follow Up Recommendations ? Home health PT ?  ?  ?Assistance Recommended at Discharge Frequent or constant Supervision/Assistance  ?Patient can return home with the following A little help with walking and/or transfers;A little help with bathing/dressing/bathroom;Assistance with cooking/housework;Direct supervision/assist for medications management;Direct supervision/assist for financial management;Assist for transportation ?  ?Equipment Recommendations ? Rolling walker (2 wheels)  ?  ?Recommendations for Other Services   ? ? ?   ?Precautions / Restrictions Precautions ?Precautions: Fall;Other (comment) ?Precaution Comments: left chest tube to suction, watch HR ?Restrictions ?Weight Bearing Restrictions: No  ?  ? ?Mobility ? Bed Mobility ?Overal bed mobility: Needs Assistance ?Bed Mobility: Supine to Sit, Sit to Supine ?  ?  ?Supine to sit: Supervision, HOB elevated ?Sit to supine: Supervision, HOB elevated ?  ?General bed mobility comments: No assist needed, increased time and use of rail. ?  ? ?Transfers ?Overall transfer level: Needs assistance ?Equipment used: Straight cane (walking stick) ?Transfers: Sit to/from Stand ?Sit to Stand: Min guard, From elevated surface ?  ?  ?  ?  ?  ?General transfer comment: Min guard for safety, stood from elevated bed height, slightly unsteady. ?  ? ?Ambulation/Gait ?Ambulation/Gait assistance: Min assist, Min guard ?Gait Distance (Feet): 24 Feet ?Assistive device: Straight cane (walking stick) ?Gait Pattern/deviations: Step-to pattern, Step-through pattern, Wide base of support, Shuffle ?Gait velocity: decreased ?  ?  ?General Gait Details: Slow, mildly unsteady gait with wide boS, using walking stick in room, Min A at times for balance esp turns, bil knee instability. HR up to 135 bpm max with activity. Difficult getting 02 reading due to poor pleth. ? ? ?Stairs ?  ?  ?  ?  ?  ? ? ?Wheelchair Mobility ?  ? ?Modified Rankin (Stroke Patients Only) ?  ? ? ?  ?Balance Overall balance assessment: Needs assistance ?Sitting-balance support: No upper extremity supported, Feet supported ?Sitting balance-Leahy Scale: Good ?  ?  ?Standing balance support: During functional activity ?Standing balance-Leahy Scale: Fair ?Standing balance comment: Needs UE support for dynamic tasks, able to stand statically without UE support ?  ?  ?  ?  ?  ?  ?  ?  ?  ?  ?  ?  ? ?  ?  Cognition Arousal/Alertness: Awake/alert ?Behavior During Therapy: Flat affect ?Overall Cognitive Status: Difficult to assess ?  ?  ?  ?  ?  ?  ?  ?   ?  ?  ?  ?  ?  ?  ?  ?  ?General Comments: Max encouragement to participate in mobility, "i feel fine." despite being in bed for 3 days. Poor awareness of deficits and what being in a hospital bed can do to muscle strength, activity tolerance etc ?  ?  ? ?  ?Exercises   ? ?  ?General Comments General comments (skin integrity, edema, etc.): HR up to 135 bpm with activity, Sp02 poor reading but likely dropping to high 80s on RA. RR 30s. ?  ?  ? ?Pertinent Vitals/Pain Pain Assessment ?Pain Assessment: No/denies pain  ? ? ?Home Living   ?  ?  ?  ?  ?  ?  ?  ?  ?  ?   ?  ?Prior Function    ?  ?  ?   ? ?PT Goals (current goals can now be found in the care plan section) Progress towards PT goals: Progressing toward goals (slowly) ? ?  ?Frequency ? ? ? Min 3X/week ? ? ? ?  ?PT Plan Current plan remains appropriate  ? ? ?Co-evaluation   ?  ?  ?  ?  ? ?  ?AM-PAC PT "6 Clicks" Mobility   ?Outcome Measure ? Help needed turning from your back to your side while in a flat bed without using bedrails?: A Little ?Help needed moving from lying on your back to sitting on the side of a flat bed without using bedrails?: A Little ?Help needed moving to and from a bed to a chair (including a wheelchair)?: A Little ?Help needed standing up from a chair using your arms (e.g., wheelchair or bedside chair)?: A Little ?Help needed to walk in hospital room?: A Little ?Help needed climbing 3-5 steps with a railing? : A Lot ?6 Click Score: 17 ? ?  ?End of Session Equipment Utilized During Treatment: Gait belt ?Activity Tolerance: Patient limited by fatigue;Patient tolerated treatment well ?Patient left: in bed;with call bell/phone within reach;with bed alarm set ?Nurse Communication: Mobility status ?PT Visit Diagnosis: Unsteadiness on feet (R26.81);Difficulty in walking, not elsewhere classified (R26.2) ?  ? ? ?Time: 6144-3154 ?PT Time Calculation (min) (ACUTE ONLY): 19 min ? ?Charges:  $Therapeutic Activity: 8-22 mins          ?           ? ?Marisa Severin, PT, DPT ?Acute Rehabilitation Services ?Secure chat preferred ?Office [phone] ? ? ? ? ? ?[place] ?11/13/2021, 3:48 PM ? ?

## 2021-11-13 NOTE — Progress Notes (Signed)
Interventional Radiology Brief Note: ? ?Planning for liver aspiration vs. Drainage vs. Biopsy tomorrow in [place] as schedule allows.  ? ?NPO p MN.  ?Hold lovenox.  ? ?Brynda Greathouse, MS RD PA-C ? ? ?

## 2021-11-13 NOTE — Progress Notes (Signed)
[facility] ? ?Discussed with lab, rare mesothelial cells seen ?I had them send an add on pleural fluid ADA. ? ?Roselie Awkward, MD ?[facility] ?Pager: [phone] ?Cell: [phone] ?After 7:00 pm call Elink  [phone] ? ?

## 2021-11-13 NOTE — Progress Notes (Signed)
?  Progress Note ? ? ?Patient: Sean Farrell:[id number] DOB: 01/06/1946 DOA: 11/10/2021     3 ?DOS: the patient was seen and examined on 11/13/2021 at 8:25AM ?  ? ? ? ?Brief hospital course: ?[name] is a 76 y.o. M with hx stroke, smoking, hx TB in 2010, and chronic heart failure EF 40-45% who presented with cough, SOB and night sweats for maybe a month. ? ?In the ER, CXR showed cavitary PNA, effusion. ? ? ?5/5: Admitted on airborne precautions, chest tube placed, Pulm consulted, CT showing multiple liver lesions, bony lytic lesions, bilateral lung nodules, no obvious primary ?5/6: Obtaining CT abd/pel --> shows pancreatic tail mass; tPA/dornase x1 ?5/7: IR consulted for liver mass biopsy; lytics second dose ? ? ? ? ?Assessment and Plan: ?* Lung consolidation  ?Probably pneumonia ?Loculated pleural effusion ?See summary form yesterday ?- AFB smear x3 to rule out TB ?- Continue Zosyn  ?- Continue  doxycycline day 3 of 5 ? ?- Follow pleural fluid cytology ?- Consult Pulm for chest tube, lytics ?- Consult IR for biopsy ? ? ?History of tuberculosis ?- Morning AFB smear x3 ?- Airborne until active TB ruled out, likely this afternoon or tomorrow ? ? ?Liver lesions and lytic bony lesions ?See summary from yesterday.  There is ambiguity if thsee liver lesions are abscess or mass. ?- Continue Zosyn to cover liver abscess until that has been ruled out ?- Consult IR for biopsy, drain ? ?Sinus tachycardia ?Due to acidosis, suspected cancer. Slightly better today off fluids. ? ?Hyperbilirubinemia ?No evidence of obstruction on CT, LFTs stable  ? ?  ? ?Chronic combined systolic (congestive) and diastolic (congestive) heart failure (HCC) ?EF 40-45% in the past, no evidence of fluid overload here.  Not on diuretic or ARB or spiro. ?- Continue metoprolol ?  ?Hypertension ?BP controlled ?- Continue amlodipine, metoprolol ? ? ? ? ? ? ? ? ? ? ?Subjective: The patietn has no complaints.  He is weak and tired, has some diffuse aches,  but no dyspnea, fever, confusion. ? ? ? ? ?Physical Exam: ?Vitals:  ? 11/12/21 1500 11/12/21 2024 11/13/21 0017 11/13/21 0400  ?BP: 140/64 (!) 142/63 131/82 112/64  ?Pulse: 90 98 (!) 102 80  ?Resp: 20 (!) _0 ?Temp: 98.4 ?F (36.9 ?C) 100 ?F (37.8 ?C) (!) 100.6 ?F (38.1 ?C) 98.7 ?F (37.1 ?C)  ?TempSrc: Oral Oral Oral Oral  ?SpO2: 93% 97% 95% 96%  ?Weight:      ?Height:      ? ?Thin adult male, lying in bed, appears weak and tired, interactive, appropriate ?Slightly tachycardic, rhythm regular, no murmurs, no peripheral edema ?Respiratory effort normal, lung sounds clear without rales or wheezes in anterior lung fields ?Abdomen soft without tenderness palpation or guarding in all quadrants ?Attention normal, affect appropriate, oriented to person, place, and situation, moves upper extremities with normal strength and coordination, speech fluent, face symmetric ? ?Data Reviewed: ?Pulmonology notes reviewed, nursing notes reviewed, vital signs reviewed ?PSA normal ?AFB smears pending ?Sodium 132, no change, creatinine normal ?LFTs stable, no change, alk phos and total bilirubin slightly elevated ?White blood cell count from 40 down to 34 ?Hemoglobin 9.5, no change ? ?Family Communication: patient Declined ? ? ? ?Disposition: ?Status is: Inpatient ? ? ? ? ? ? ? ? ?Author: ?Edwin Dada, MD ?11/13/2021 8:55 AM ? ?For on call review [email or web address].  ? ? ?

## 2021-11-14 ENCOUNTER — Inpatient Hospital Stay (HOSPITAL_COMMUNITY): Payer: Medicare Other

## 2021-11-14 DIAGNOSIS — M899 Disorder of bone, unspecified: Secondary | ICD-10-CM | POA: Diagnosis present

## 2021-11-14 DIAGNOSIS — Z8611 Personal history of tuberculosis: Secondary | ICD-10-CM | POA: Diagnosis not present

## 2021-11-14 DIAGNOSIS — J181 Lobar pneumonia, unspecified organism: Secondary | ICD-10-CM | POA: Diagnosis not present

## 2021-11-14 DIAGNOSIS — I679 Cerebrovascular disease, unspecified: Secondary | ICD-10-CM | POA: Diagnosis not present

## 2021-11-14 DIAGNOSIS — I5042 Chronic combined systolic (congestive) and diastolic (congestive) heart failure: Secondary | ICD-10-CM | POA: Diagnosis not present

## 2021-11-14 LAB — COMPREHENSIVE METABOLIC PANEL
ALT: 19 U/L (ref 0–44)
AST: 54 U/L — ABNORMAL HIGH (ref 15–41)
Albumin: 2 g/dL — ABNORMAL LOW (ref 3.5–5.0)
Alkaline Phosphatase: 143 U/L — ABNORMAL HIGH (ref 38–126)
Anion gap: 7 (ref 5–15)
BUN: 14 mg/dL (ref 8–23)
CO2: 23 mmol/L (ref 22–32)
Calcium: 9.3 mg/dL (ref 8.9–10.3)
Chloride: 104 mmol/L (ref 98–111)
Creatinine, Ser: 0.85 mg/dL (ref 0.61–1.24)
GFR, Estimated: >60 mL/min (ref >60)
Glucose, Bld: 113 mg/dL — ABNORMAL HIGH (ref 70–99)
Potassium: 3.6 mmol/L (ref 3.5–5.1)
Sodium: 134 mmol/L — ABNORMAL LOW (ref 135–145)
Total Bilirubin: 1.9 mg/dL — ABNORMAL HIGH (ref 0.3–1.2)
Total Protein: 7.1 g/dL (ref 6.5–8.1)

## 2021-11-14 LAB — CBC
HCT: 32.3 % — ABNORMAL LOW (ref 39.0–52.0)
Hemoglobin: 10.4 g/dL — ABNORMAL LOW (ref 13.0–17.0)
MCH: 28.8 pg (ref 26.0–34.0)
MCHC: 32.2 g/dL (ref 30.0–36.0)
MCV: 89.5 fL (ref 80.0–100.0)
Platelets: 442 10*3/uL — ABNORMAL HIGH (ref 150–400)
RBC: 3.61 MIL/uL — ABNORMAL LOW (ref 4.22–5.81)
RDW: 13.9 % (ref 11.5–15.5)
WBC: 36.9 10*3/uL — ABNORMAL HIGH (ref 4.0–10.5)
nRBC: 0 % (ref 0.0–0.2)

## 2021-11-14 NOTE — Assessment & Plan Note (Deleted)
-   As above, if liver mass not malignant, would need to proceed with bony lesion biopsy ?

## 2021-11-14 NOTE — Progress Notes (Signed)
Physical Therapy Treatment ?Patient Details ?Name: Sean Farrell ?MRN: [id number] ?DOB: 10-02-1945 ?Today's Date: 11/14/2021 ? ? ?History of Present Illness 76 yo male presenting to ED on 5/5 with SOB and back pain. Chest CT showing concern for large cavitary lesion in the right upper lobe abutting the mediastinum. S/p L pigtail chest tube placed on 5/5. Awaiting liver biopsy, pt NPO for this on 5/9 but hadn't been taken for procedure as of 4pm. PMH including HTN, polysubstance abuse, mycobacterium tuberculosis  (2010), CHF, Hep C, and L ankle sx. ? ?  ?PT Comments  ? ? Pt received in supine, agreeable to therapy session with max encouragement with goal of progressing gait tolerance and transfer up to chair. Pt apprehensive to sit up in chair but agreeable to trying for <1 hour, RN notified. Pt needing up to min guard for transfers and gait using RW, pt more stable and able to progress gait distance further with RW support. DOE 2/4 with exertion and poor pleth signal on finger sensor, SpO2 reading 86-88% post-exertion with noisy signal, when signal improved with seated rest break reading 92% and above on RA, he will need ambulatory sats monitored next session. Pt continues to benefit from PT services to progress toward functional mobility goals.    ?Recommendations for follow up therapy are one component of a multi-disciplinary discharge planning process, led by the attending physician.  Recommendations may be updated based on patient status, additional functional criteria and insurance authorization. ? ?Follow Up Recommendations ? Home health PT ?  ?  ?Assistance Recommended at Discharge Frequent or constant Supervision/Assistance  ?Patient can return home with the following A little help with walking and/or transfers;A little help with bathing/dressing/bathroom;Assistance with cooking/housework;Direct supervision/assist for medications management;Direct supervision/assist for financial management;Assist for  transportation ?  ?Equipment Recommendations ? Rolling walker (2 wheels)  ?  ?Recommendations for Other Services   ? ? ?  ?Precautions / Restrictions Precautions ?Precautions: Fall;Other (comment) ?Precaution Comments: Airborne precs (possible TB); left chest tube to suction, watch HR/SpO2 ?Restrictions ?Weight Bearing Restrictions: No  ?  ? ?Mobility ? Bed Mobility ?Overal bed mobility: Needs Assistance ?Bed Mobility: Supine to Sit ?  ?  ?Supine to sit: Supervision, HOB elevated ?  ?  ?General bed mobility comments: No assist needed, increased time and use of rail. ?  ? ?Transfers ?Overall transfer level: Needs assistance ?Equipment used: Rolling walker (2 wheels) ?Transfers: Sit to/from Stand ?Sit to Stand: Min guard, From elevated surface ?  ?  ?  ?  ?  ?General transfer comment: Min guard for safety, stood from elevated bed height, using RW for stability ?  ? ?Ambulation/Gait ?Ambulation/Gait assistance: Min guard ?Gait Distance (Feet): 60 Feet ?Assistive device: Rolling walker (2 wheels) ?Gait Pattern/deviations: Step-to pattern, Step-through pattern ?Gait velocity: decreased ?  ?  ?General Gait Details: SpO2 reading 86-88% on portable pulse ox immediately post-exertion, poor signal on finger sensor during gait trial but after 1-2 minutes seated break, finger sensor reading 92% and greater, will need further assessment. HR elevated 120's bpm with exertion. No dizziness reported although pt is unreliable in reporting his symptoms of dyspnea/fatigue overall. ? ? ?Stairs ?  ?  ?  ?  ?  ? ? ?Wheelchair Mobility ?  ? ?Modified Rankin (Stroke Patients Only) ?  ? ? ?  ?Balance Overall balance assessment: Mild deficits observed, not formally tested ?  ?  ?  ?  ?  ?  ?  ?  ?  ?  ?  ?  ?  ?  ?  ?  ?  ?  ?  ? ?  ?  Cognition Arousal/Alertness: Awake/alert ?Behavior During Therapy: Flat affect ?Overall Cognitive Status: Difficult to assess ?  ?  ?  ?  ?  ?  ?  ?  ?  ?  ?  ?  ?  ?  ?  ?  ?General Comments: Max encouragement  to participate in mobility, poor awareness of deficits and what being in a hospital bed can do to muscle strength, activity tolerance etc. Pt agreeable to use RW due to multiple lines/chest tube; pt dyspneic with exertion but states "it's about the same" when resting in chair although appears much less dyspneic at time therapist asked. ?  ?  ? ?  ?Exercises Other Exercises ?Other Exercises: seated BLE AROM: hip flexion x10 reps ea ? ?  ?General Comments General comments (skin integrity, edema, etc.): see gait comments; pt agreeable to sit up in chair an hour with encouragement, RN notified to check on him, no chair alarm in room may need alarm pad brought in, per RN he has been good about using call bell and not getting up unassisted; pt asking about when biopsy is happening he had been NPO all day and was feeling thirsty. ?  ?  ? ?Pertinent Vitals/Pain Pain Assessment ?Pain Assessment: Faces ?Faces Pain Scale: Hurts a little bit ?Pain Location: CT insertion site ?Pain Descriptors / Indicators: Discomfort ?Pain Intervention(s): Monitored during session, Repositioned  ? ? ?Home Living   ?  ?  ?  ?  ?  ?  ?  ?  ?  ?   ?  ?Prior Function    ?  ?  ?   ? ?PT Goals (current goals can now be found in the care plan section) Acute Rehab PT Goals ?Patient Stated Goal: did not state; to go home after hospitalization ?PT Goal Formulation: With patient ?Time For Goal Achievement: 11/25/21 ?Progress towards PT goals: Progressing toward goals ? ?  ?Frequency ? ? ? Min 3X/week ? ? ? ?  ?PT Plan Current plan remains appropriate  ? ? ?Co-evaluation   ?  ?  ?  ?  ? ?  ?AM-PAC PT "6 Clicks" Mobility   ?Outcome Measure ? Help needed turning from your back to your side while in a flat bed without using bedrails?: A Little ?Help needed moving from lying on your back to sitting on the side of a flat bed without using bedrails?: A Little ?Help needed moving to and from a bed to a chair (including a wheelchair)?: A Little ?Help needed  standing up from a chair using your arms (e.g., wheelchair or bedside chair)?: A Little ?Help needed to walk in hospital room?: A Little ?Help needed climbing 3-5 steps with a railing? : A Lot ?6 Click Score: 17 ? ?  ?End of Session Equipment Utilized During Treatment: Gait belt ?Activity Tolerance: Patient tolerated treatment well ?Patient left: in chair;with call bell/phone within reach;Other (comment) (pt able to demo back use of call bell, RN aware he does not have alarm pad in room) ?Nurse Communication: Mobility status;Other (comment) (no chair alarm pad in room; pt does better with RW than with walking stick, especially due to chest tube and multiple lines; DOE) ?PT Visit Diagnosis: Unsteadiness on feet (R26.81);Difficulty in walking, not elsewhere classified (R26.2) ?  ? ? ?Time: 4650-3546 ?PT Time Calculation (min) (ACUTE ONLY): 32 min ? ?Charges:  $Gait Training: 8-22 mins ?$Therapeutic Activity: 8-22 mins          ?          ? ?  Kelce Bouton P., PTA ?Acute Rehabilitation Services ?Secure Chat Preferred 9a-5:30pm ?Office: [phone]  ? ? ?Kara Pacer Shakila Mak ?11/14/2021, 6:33 PM ? ?

## 2021-11-14 NOTE — Progress Notes (Signed)
?Progress Note ? ? ?Patient: Sean Farrell WYO:[id number] DOB: 1946/04/17 DOA: 11/10/2021     4 ?DOS: the patient was seen and examined on 11/14/2021 at 8:32AM ?  ? ? ? ?Brief hospital course: ?[name] is a 77 y.o. M with hx stroke, smoking, hx TB in 2010, and chronic heart failure EF 40-45% who presented with cough, SOB and night sweats for maybe a month. ? ?In the ER, CXR showed cavitary PNA, effusion. ? ? ?5/5: Admitted on airborne precautions, chest tube placed, Pulm consulted, CT showing multiple liver lesions, bony lytic lesions, bilateral lung nodules, no obvious primary ?5/6: Obtaining CT abd/pel --> shows pancreatic tail mass; tPA/dornase x1 ?5/7: IR consulted for liver mass biopsy; lytics second dose ?5/9: To IR for liver US and biopsy vs drain placement ? ? ? ? ?Assessment and Plan: ?* Lung consolidation ([place]) ?Lung nodules ?Probably pneumonia ?CT chest with left loculated effusion, moderate in size, no significant pneumonia, old granulomatous disease but also several new small nodules concerning for metastases vs infection and a nodular component to pleural disease.  RVP negative.  HIV negative. ? ?Given subacute presentation, night sweats, cough, prior TB, will want to rule out active TB ? ?- AFB smear x3 to rule out TB ?- Continue Zosyn  ?- Continue  doxycycline day 4 of 5 ?  ? ? ?Loculated pleural effusion ?Exudative by 2/3 lights.   ?- Consult Pulmonology for chest tube mgmt, lytics ?- See above ? ?History of tuberculosis ?Discussed imaging with IR, infection would be an extremely unusual cause of his disparate imaging findings (liver abscess, bulky L 9th rib mass, pleural nodules, etc) but TB or Blasto could do this. ?- Morning AFB smear x3 ?- Airborne until active TB ruled out ? ?Lytic bone lesions on xray ?- As above, if liver mass not malignant, would need to proceed with bony lesion biopsy ? ?Liver lesion  ?CT shows 2 semi-large (3-5cm) liver lesions, consistent with malignancy or abscess.   Also shows left rib mass, nodularity of pleural effusion, pancreatic tail mass, retroperitoneal node. ? ?Strong suspicion overall presenting syndrome is due to metastatic cancer, but there is also decent change that liver mass is abscess. PSA normal.   ?- Continue Zosyn to cover liver abscess ?- Consult IR for biopsy ?- If liver mass is not cancer, would likely need to biopsy bony lytic lesion, which appears malignant (left 9th rib might be candidate) ? ? ?Sinus tachycardia ?Due to acidosis, suspected cancer, abscess.  No improvement with fluids ? ? ?Hyperbilirubinemia ?No evidence of obstruction on CT.  Trend not concerning, LFTs stable.  ? ?Metabolic acidosis ?Non-gap.   Improved.  Likely from IV fluids.  ? ?Normocytic anemia ?Hgb mildly low, stable, no clinical bleeding ? ?Cerebrovascular disease ?Not on aspirin or statin ? ?Chronic combined systolic (congestive) and diastolic (congestive) heart failure (HCC) ?EF 40-45% in the past, no evidence of fluid overload here.  Not on diuretic or ARB or spiro. ?- Continue metoprolol ? ?Tobacco abuse ?  ? ?Hyponatremia ?No change. ? ?Hypertension ?BP controlled ?- Continue amlodipine, metoprolol ? ? ? ? ? ? ? ? ? ? ?Subjective: Patient overall feeling somewhat better, he still very listless and tired, he had a mild fever overnight.  No respiratory distress, vomiting.  He has some mild chest discomfort with deep inspiration. ? ? ? ? ?Physical Exam: ?Vitals:  ? 11/13/21 1959 11/13/21 2348 11/14/21 0406 11/14/21 7741  ?BP: 119/69 (!) 142/70 124/64 126/66  ?Pulse: 87 86  96 99  ?Resp: '18 18 20 '$ (!) 23  ?Temp: 97.6 ?F (36.4 ?C) 98.4 ?F (36.9 ?C) (!) 100.4 ?F (38 ?C) 99.6 ?F (37.6 ?C)  ?TempSrc: Axillary Oral Oral Oral  ?SpO2: 97% 94% 94% 96%  ?Weight:   75.3 kg   ?Height:      ? ?Thin adult male, lying in bed, appears listless and tired ?Tachycardic, regular, no murmurs, no pitting lower extremity edema although he appears a little bit edematous ?Respiratory rate shallow and  somewhat fast, lung sounds clear but diminished, no wheezing. ?Abdomen soft without tenderness palpation or guarding, no ascites or distention ?Attention normal, affect appropriate, judgment and insight appear normal, face symmetric, speech fluent ? ?Data Reviewed: ?Discussed with radiology, nursing notes reviewed, vital signs reviewed ?Labs are notable for sodium 134, no change, potassium and creatinine normal ?Total bilirubin slightly down, overall LFTs stable and minimally elevated ?White blood cell count 34 K, no change, hemoglobin 10.4, no change ? ?  ? ? ? ?Disposition: ?Status is: Inpatient ?Remains inpatient appropriate because: The patient was admitted with shortness of breath, found to have pleural effusion, nodular consolidation in the lung, liver lesions, bony lesions. ? ?He remains tachycardic, febrile, and tachypneic, but is hemodynamically stable.  His work-up remains far from clear, and he will need a biopsy of the liver, possible treatment of liver abscess, and may be biopsy of a bony mass. ? ?If the liver ultrasound procedure this morning helps clarify the nature of his liver lesion, I may consult oncology or ID ? ? ? ? ? ? ? ?Author: ?Edwin Dada, MD ?11/14/2021 9:08 AM ? ?For on call review [email or web address].  ? ? ?

## 2021-11-14 NOTE — Progress Notes (Signed)
? ?  NAME:  Sean Farrell, MRN:  [id number], DOB:  1945/12/27, LOS: 4 ?ADMISSION DATE:  11/10/2021, CONSULTATION DATE:  11/10/21 ?REFERRING MD:  Sherren Mocha, MD CHIEF COMPLAINT:  Pneumonia  ? 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 He reports decreased appetite over recent months and has noted night sweats. He denies fevers and chills. He smokes cigarettes and marijuana regularly.  ? ?PCCM has been consulted for loculated pleural effusion and concern for TB.  ? ?Pertinent  Medical History  ?Hypertension ?Tobacco Abuse ?Mycobacterium Tuberculosis 2010 ?Hepatitis C ?Polysubstance Use ? ?Significant Hospital Events: ?Including procedures, antibiotic start and stop dates in addition to other pertinent events   ?5/5 admitted, left pigtail chest tube placed ?5/9 chest tube charted as putting out 340 mL; total output noted to be at 1450cc at 9:30  ? ?Interim History / Subjective:  ? ?Feels about the same ?No dyspnea ? ?Objective   ?Blood pressure 126/66, pulse 99, temperature 99.6 ?F (37.6 ?C), temperature source Oral, resp. rate (!) 23, height '6\' 2"'$  (1.88 m), weight 75.3 kg, SpO2 96 %. ?   ?   ? ?Intake/Output Summary (Last 24 hours) at 11/14/2021 1009 ?Last data filed at 11/14/2021 0758 ?Gross per 24 hour  ?Intake 400 ml  ?Output 965 ml  ?Net -565 ml  ? ?Filed Weights  ? 11/10/21 0837 11/13/21 1000 11/14/21 0406  ?Weight: 77.1 kg 73.9 kg 75.3 kg  ? ? ?Examination: ? ?General:  Chronically ill appearing, resting comfortably in bed ?HENT: NCAT OP clear ?PULM: Diminished R base B, normal effort ?CV: RRR, no mgr ?GI: BS+, soft, nontender ?MSK: normal bulk and tone ?Neuro: awake, alert, no distress, MAEW ? ? ?CXR personally reviewed: small pleural effusion on the left, pigtail chest tube  in place, ? kinked ? ?Resolved Hospital Problem list   ? ? ?Assessment & Plan:  ?Left Loculated Pleural Effusion ?Multiple Pulmonary Nodules ?Concern for metastatic disease with multiple Liver Lesions and multiple bony lytic lesions in thoracic spin and bilateral ribs ?Severe Protein Calorie Malnutrition ? ?Discussion: ?Left loculated effusion differential includes malignancy vs infectious etiology.  ?Picture most likely explained by malignancy but given history of TB, scant mesothelials on the pleural fluid, TB is possible.   ? ?Plan: ?- agree with IR biopsy ?- CXR now and in AM ?- given chest tube output of 331m overnight, will hold off on removing chest tube; of note total chest tube output noted to be 1450 mL 9:30 on 5/9 ?- follow pleural fluid cultures and cytology ? ? ?Best Practice (right click and "Reselect all SmartList Selections" daily)  ? ?Per Primary ? ?Critical care time:   ? ?BRoselie Awkward MD ?LF[facility] PiercePCCM ?Pager: [phone]?Cell: [phone] ?After 7:00 pm call Elink  [phone]? ?11/14/2021, 10:09 AM  ? ?  ?

## 2021-11-15 ENCOUNTER — Inpatient Hospital Stay (HOSPITAL_COMMUNITY): Payer: Medicare Other

## 2021-11-15 DIAGNOSIS — J90 Pleural effusion, not elsewhere classified: Secondary | ICD-10-CM

## 2021-11-15 DIAGNOSIS — Z8611 Personal history of tuberculosis: Secondary | ICD-10-CM | POA: Diagnosis not present

## 2021-11-15 DIAGNOSIS — I5042 Chronic combined systolic (congestive) and diastolic (congestive) heart failure: Secondary | ICD-10-CM | POA: Diagnosis not present

## 2021-11-15 DIAGNOSIS — I679 Cerebrovascular disease, unspecified: Secondary | ICD-10-CM | POA: Diagnosis not present

## 2021-11-15 DIAGNOSIS — M899 Disorder of bone, unspecified: Secondary | ICD-10-CM

## 2021-11-15 DIAGNOSIS — K769 Liver disease, unspecified: Secondary | ICD-10-CM

## 2021-11-15 DIAGNOSIS — E872 Acidosis, unspecified: Secondary | ICD-10-CM

## 2021-11-15 DIAGNOSIS — J181 Lobar pneumonia, unspecified organism: Secondary | ICD-10-CM | POA: Diagnosis not present

## 2021-11-15 DIAGNOSIS — E871 Hypo-osmolality and hyponatremia: Secondary | ICD-10-CM

## 2021-11-15 LAB — BASIC METABOLIC PANEL
Anion gap: 6 (ref 5–15)
BUN: 16 mg/dL (ref 8–23)
CO2: 23 mmol/L (ref 22–32)
Calcium: 9 mg/dL (ref 8.9–10.3)
Chloride: 105 mmol/L (ref 98–111)
Creatinine, Ser: 0.76 mg/dL (ref 0.61–1.24)
GFR, Estimated: >60 mL/min (ref >60)
Glucose, Bld: 123 mg/dL — ABNORMAL HIGH (ref 70–99)
Potassium: 3.5 mmol/L (ref 3.5–5.1)
Sodium: 134 mmol/L — ABNORMAL LOW (ref 135–145)

## 2021-11-15 LAB — CULTURE, BLOOD (ROUTINE X 2)
Culture: NO GROWTH
Culture: NO GROWTH
Special Requests: ADEQUATE

## 2021-11-15 LAB — CBC
HCT: 28 % — ABNORMAL LOW (ref 39.0–52.0)
Hemoglobin: 9.3 g/dL — ABNORMAL LOW (ref 13.0–17.0)
MCH: 29.4 pg (ref 26.0–34.0)
MCHC: 33.2 g/dL (ref 30.0–36.0)
MCV: 88.6 fL (ref 80.0–100.0)
Platelets: 411 10*3/uL — ABNORMAL HIGH (ref 150–400)
RBC: 3.16 MIL/uL — ABNORMAL LOW (ref 4.22–5.81)
RDW: 14.1 % (ref 11.5–15.5)
WBC: 40.7 10*3/uL — ABNORMAL HIGH (ref 4.0–10.5)
nRBC: 0 % (ref 0.0–0.2)

## 2021-11-15 LAB — ACID FAST SMEAR (AFB, MYCOBACTERIA)
Acid Fast Smear: NEGATIVE
Acid Fast Smear: NEGATIVE

## 2021-11-15 LAB — CYTOLOGY - NON PAP

## 2021-11-15 MED ORDER — MIDAZOLAM HCL 2 MG/2ML IJ SOLN
INTRAMUSCULAR | Status: AC
Start: 2021-11-15 — End: 2021-11-15
  Filled 2021-11-15: qty 4

## 2021-11-15 MED ORDER — LIDOCAINE HCL (PF) 1 % IJ SOLN
INTRAMUSCULAR | Status: AC
  Filled 2021-11-15: qty 30

## 2021-11-15 MED ORDER — FENTANYL CITRATE (PF) 100 MCG/2ML IJ SOLN
INTRAMUSCULAR | Status: AC | PRN
  Administered 2021-11-15 (×2): 25 ug via INTRAVENOUS

## 2021-11-15 MED ORDER — GELATIN ABSORBABLE 12-7 MM EX MISC
CUTANEOUS | Status: AC
  Filled 2021-11-15: qty 1

## 2021-11-15 MED ORDER — MIDAZOLAM HCL 2 MG/2ML IJ SOLN
INTRAMUSCULAR | Status: AC | PRN
  Administered 2021-11-15: 1 mg via INTRAVENOUS
  Administered 2021-11-15: .5 mg via INTRAVENOUS

## 2021-11-15 MED ORDER — FENTANYL CITRATE (PF) 100 MCG/2ML IJ SOLN
INTRAMUSCULAR | Status: AC
  Filled 2021-11-15: qty 4

## 2021-11-15 NOTE — Progress Notes (Signed)
? ?PROGRESS NOTE ? ? ? ?SERIGNE Farrell  RSW:[id number] DOB: 06/20/1946 DOA: 11/10/2021 ?PCP: Nolene Ebbs, MD ? ? ?Brief Narrative: ?Mr. Sean Farrell is a 76 y.o. M with hx stroke, smoking, hx TB in 2010, and chronic heart failure EF 40-45% who presented with cough, SOB and night sweats for maybe a month. ? ?In the ER, CXR showed cavitary PNA, effusion. ? ? ?5/5: Admitted on airborne precautions, chest tube placed, Pulm consulted, CT showing multiple liver lesions, bony lytic lesions, bilateral lung nodules, no obvious primary ?5/6: Obtaining CT abd/pel --> shows pancreatic tail mass; tPA/dornase x1 ?5/7: IR consulted for liver mass biopsy; lytics second dose ?5/10: To IR for liver US and biopsy vs drain placement ? ? ?Assessment and Plan: ? ?Lung consolidation ?Probable pneumonia ?Noted on imaging. Started on empiric antibiotic therapy with Vancomycin, Cefepime and azithromycin and transitioned to Ceftriaxone and doxycycline and finally to Zosyn and doxycycline. Leukocytosis persistent. Associated fevers. ?-Continue Zosyn and doxycycline ? ?Leukocytosis ?Persistent and significantly elevated. In setting of above likely infection. ?-Path smear review ? ?Loculated pleural effusion ?Left chest tube placed on 5/5. PCCM managing. ? ?History of tuberculosis ?Pleural fluid with negative AFB. Concern for possible active TB. Morning AFB x3 ordered to rule out active TB and completed; results pending. ? ?Lytic bone lesions ?In setting of long nodules and liver lesions. Concern for metastatic process. Liver biopsy performed with pathology pending. ? ?Liver lesions ?CT chest and abdomen/pelvis significant for multiple liver lesions concerning for metastatic disease. Radiology consulted for liver biopsy. Liver biopsy performed on 5/10. ? ?Lung nodules ?CT chest from 5/5 significant for multiple small noncalcified pulmonary nodules; possible malignancy. ? ?Sinus tachycardia ?Resolved. ? ?Hyperbilirubinemia ?Mild.  Improving. ? ?Metabolic acidosis ?Mild and now resolved. ? ?Thrombocytosis ?Likely reactive. ? ?Normocytic anemia ?Chronic. Stable. ? ?Cerebrovascular disease ?Not currently on aspirin or statin. ? ?Chronic combined systolic and diastolic heart failure ?LVEF of 40-45% from Transthoracic Echocardiogram on 07/08/17. Stable. ?-Continue metoprolol ? ?Tobacco use ?-Continue nicotine patch ? ?Hyponatremia ?Mild. Stable. ? ?Primary hypertension ?Well controlled. ?-Continue amlodipine and doxycycline ? ?DVT prophylaxis: Lovenox ?Code Status:   Code Status: Full Code ?Family Communication: None at bedside ?Disposition Plan: Discharge home pending ability to transition to outpatient antibiotics if needed, removal of chest tube, improvement of leukocytosis. Likely not home for 3+ days. ? ? ?Consultants:  ?PCCM ?Interventional radiology ? ?Procedures:  ?Chest tube insertion (5/5) ? ?Antimicrobials: ?Vancomycin ?Doxycycline ?Cefepime ?Ceftriaxone ?Azithromycin ?Zosyn  ? ? ?Subjective: ?Patient with no issues overnight. On his way to radiology for biopsy. ? ?Objective: ?BP 134/61 (BP Location: Left Arm)   Pulse 85   Temp 98.9 ?F (37.2 ?C) (Oral)   Resp 17   Ht '6\' 2"'$  (1.88 m)   Wt 74.9 kg   SpO2 96%   BMI 21.20 kg/m?  ? ?Examination: ? ?General exam: Appears calm and comfortable ?Respiratory system: Respiratory effort normal. ?Gastrointestinal system: Abdomen is non-distended. ?Central nervous system: Alert and oriented. ?Psychiatry: Judgement and insight appear normal. Mood & affect appropriate.  ? ? ?Data Reviewed: I have personally reviewed following labs and imaging studies ? ?CBC ?Lab Results  ?Component Value Date  ? WBC 40.7 (H) 11/15/2021  ? RBC 3.16 (L) 11/15/2021  ? HGB 9.3 (L) 11/15/2021  ? HCT 28.0 (L) 11/15/2021  ? MCV 88.6 11/15/2021  ? MCH 29.4 11/15/2021  ? PLT 411 (H) 11/15/2021  ? MCHC 33.2 11/15/2021  ? RDW 14.1 11/15/2021  ? LYMPHSABS 1.6 11/11/2021  ? MONOABS 3.0 (H)  11/11/2021  ? EOSABS 0.1 11/11/2021   ? BASOSABS 0.2 (H) 11/11/2021  ? ? ? ?Last metabolic panel ?Lab Results  ?Component Value Date  ? NA 134 (L) 11/15/2021  ? K 3.5 11/15/2021  ? CL 105 11/15/2021  ? CO2 23 11/15/2021  ? BUN 16 11/15/2021  ? CREATININE 0.76 11/15/2021  ? GLUCOSE 123 (H) 11/15/2021  ? GFRNONAA >60 11/15/2021  ? GFRAA >60 07/09/2017  ? CALCIUM 9.0 11/15/2021  ? PROT 7.1 11/14/2021  ? ALBUMIN 2.0 (L) 11/14/2021  ? BILITOT 1.9 (H) 11/14/2021  ? ALKPHOS 143 (H) 11/14/2021  ? AST 54 (H) 11/14/2021  ? ALT 19 11/14/2021  ? ANIONGAP 6 11/15/2021  ? ? ?GFR: ?Estimated Creatinine Clearance: 85.8 mL/min (by C-G formula based on SCr of 0.76 mg/dL). ? ?Recent Results (from the past 240 hour(s))  ?Resp Panel by RT-PCR (Flu A&B, Covid) Nasopharyngeal Swab     Status: None  ? Collection Time: 11/10/21 10:02 AM  ? Specimen: Nasopharyngeal Swab; Nasopharyngeal(NP) swabs in vial transport medium  ?Result Value Ref Range Status  ? SARS Coronavirus 2 by RT PCR NEGATIVE NEGATIVE Final  ?  Comment: (NOTE) ?SARS-CoV-2 target nucleic acids are NOT DETECTED. ? ?The SARS-CoV-2 RNA is generally detectable in upper respiratory ?specimens during the acute phase of infection. The lowest ?concentration of SARS-CoV-2 viral copies this assay can detect is ?138 copies/mL. A negative result does not preclude SARS-Cov-2 ?infection and should not be used as the sole basis for treatment or ?other patient management decisions. A negative result may occur with  ?improper specimen collection/handling, submission of specimen other ?than nasopharyngeal swab, presence of viral mutation(s) within the ?areas targeted by this assay, and inadequate number of viral ?copies(<138 copies/mL). A negative result must be combined with ?clinical observations, patient history, and epidemiological ?information. The expected result is Negative. ? ?Fact Sheet for Patients:  ?EntrepreneurPulse.com.au ? ?Fact Sheet for Healthcare Providers:   ?IncredibleEmployment.be ? ?This test is no t yet approved or cleared by the [place] FDA and  ?has been authorized for detection and/or diagnosis of SARS-CoV-2 by ?FDA under an Emergency Use Authorization (EUA). This EUA will remain  ?in effect (meaning this test can be used) for the duration of the ?COVID-19 declaration under Section 564(b)(1) of the Act, 21 ?U.S.C.section 360bbb-3(b)(1), unless the authorization is terminated  ?or revoked sooner.  ? ? ?  ? Influenza A by PCR NEGATIVE NEGATIVE Final  ? Influenza B by PCR NEGATIVE NEGATIVE Final  ?  Comment: (NOTE) ?The Xpert Xpress SARS-CoV-2/FLU/RSV plus assay is intended as an aid ?in the diagnosis of influenza from Nasopharyngeal swab specimens and ?should not be used as a sole basis for treatment. Nasal washings and ?aspirates are unacceptable for Xpert Xpress SARS-CoV-2/FLU/RSV ?testing. ? ?Fact Sheet for Patients: ?EntrepreneurPulse.com.au ? ?Fact Sheet for Healthcare Providers: ?IncredibleEmployment.be ? ?This test is not yet approved or cleared by the [place] FDA and ?has been authorized for detection and/or diagnosis of SARS-CoV-2 by ?FDA under an Emergency Use Authorization (EUA). This EUA will remain ?in effect (meaning this test can be used) for the duration of the ?COVID-19 declaration under Section 564(b)(1) of the Act, 21 U.S.C. ?section 360bbb-3(b)(1), unless the authorization is terminated or ?revoked. ? ?Performed at [facility], [place] [address] [place], [place] ?[address] ?  ?Respiratory (~20 pathogens) panel by PCR     Status: None  ? Collection Time: 11/10/21 10:02 AM  ? Specimen: Nasopharyngeal Swab; Respiratory  ?Result Value Ref Range Status  ?  Adenovirus NOT DETECTED NOT DETECTED Final  ? Coronavirus 229E NOT DETECTED NOT DETECTED Final  ?  Comment: (NOTE) ?The Coronavirus on the Respiratory Panel, DOES NOT test for the novel  ?Coronavirus (2019 nCoV) ?  ? Coronavirus  HKU1 NOT DETECTED NOT DETECTED Final  ? Coronavirus NL63 NOT DETECTED NOT DETECTED Final  ? Coronavirus OC43 NOT DETECTED NOT DETECTED Final  ? Metapneumovirus NOT DETECTED NOT DETECTED Final  ? Rhinovirus / Enterovirus NOT DETECTED NOT DETECTED Final  ? Influenza A

## 2021-11-15 NOTE — Progress Notes (Signed)
Occupational Therapy Treatment ?Patient Details ?Name: Sean Farrell ?MRN: [id number] ?DOB: 1946/04/08 ?Today's Date: 11/15/2021 ? ? ?History of present illness 76 yo male presenting to ED on 5/5 with SOB and back pain. Chest CT showing concern for large cavitary lesion in the right upper lobe abutting the mediastinum. S/p L pigtail chest tube placed on 5/5. Awaiting liver biopsy, pt NPO for this on 5/9 but hadn't been taken for procedure as of 4pm. PMH including HTN, polysubstance abuse, mycobacterium tuberculosis  (2010), CHF, Hep C, and L ankle sx. ?  ?OT comments ? Patient received in bed and required max encouragement to participate. Patient stated he had been up earlier and taken self to bathroom. Patient was supervision to get to EOB and nursing informed patient that transportation was ready to take him for liver biopsy. Patient stood from EOB and side stepped towards [facility] before returning to supine with supervision. Acute OT to continue to follow.   ? ?Recommendations for follow up therapy are one component of a multi-disciplinary discharge planning process, led by the attending physician.  Recommendations may be updated based on patient status, additional functional criteria and insurance authorization. ?   ?Follow Up Recommendations ? Home health OT  ?  ?Assistance Recommended at Discharge Frequent or constant Supervision/Assistance  ?Patient can return home with the following ? A little help with walking and/or transfers;A little help with bathing/dressing/bathroom;Assistance with cooking/housework ?  ?Equipment Recommendations ? None recommended by OT  ?  ?Recommendations for Other Services   ? ?  ?Precautions / Restrictions Precautions ?Precautions: Fall;Other (comment) ?Precaution Comments: Airborne precs (possible TB); left chest tube to suction, watch HR/SpO2 ?Restrictions ?Weight Bearing Restrictions: No  ? ? ?  ? ?Mobility Bed Mobility ?Overal bed mobility: Needs Assistance ?Bed Mobility: Supine to  Sit, Sit to Supine ?  ?  ?Supine to sit: Supervision, HOB elevated ?Sit to supine: Supervision, HOB elevated ?  ?General bed mobility comments: no assistance needed, used rail to assist ?  ? ?Transfers ?Overall transfer level: Needs assistance ?Equipment used: None ?Transfers: Sit to/from Stand ?Sit to Stand: Min guard, From elevated surface ?  ?  ?  ?  ?  ?General transfer comment: stood from EOB and performed side steps towards [facility] ?  ?  ?Balance Overall balance assessment: Mild deficits observed, not formally tested ?Sitting-balance support: No upper extremity supported, Feet supported ?Sitting balance-Leahy Scale: Good ?  ?  ?Standing balance support: During functional activity ?Standing balance-Leahy Scale: Fair ?Standing balance comment: one extremity support standing at EOB ?  ?  ?  ?  ?  ?  ?  ?  ?  ?  ?  ?   ? ?ADL either performed or assessed with clinical judgement  ? ?ADL   ?  ?  ?  ?  ?  ?  ?  ?  ?  ?  ?  ?  ?  ?  ?  ?  ?  ?  ?  ?  ?  ? ?Extremity/Trunk Assessment   ?  ?  ?  ?  ?  ? ?Vision   ?  ?  ?Perception   ?  ?Praxis   ?  ? ?Cognition Arousal/Alertness: Awake/alert ?Behavior During Therapy: Flat affect ?Overall Cognitive Status: Difficult to assess ?  ?  ?  ?  ?  ?  ?  ?  ?  ?  ?  ?  ?  ?  ?  ?  ?General Comments: max encouragement  to participate. Willing to sit on EOB and stand from EOB only. Stated he had been up earlier ?  ?  ?   ?Exercises   ? ?  ?Shoulder Instructions   ? ? ?  ?General Comments    ? ? ?Pertinent Vitals/ Pain       Pain Assessment ?Pain Assessment: Faces ?Faces Pain Scale: Hurts a little bit ?Pain Location: CT insertion site ?Pain Descriptors / Indicators: Discomfort ?Pain Intervention(s): Monitored during session ? ?Home Living   ?  ?  ?  ?  ?  ?  ?  ?  ?  ?  ?  ?  ?  ?  ?  ?  ?  ?  ? ?  ?Prior Functioning/Environment    ?  ?  ?  ?   ? ?Frequency ? Min 2X/week  ? ? ? ? ?  ?Progress Toward Goals ? ?OT Goals(current goals can now be found in the care plan section) ? Progress  towards OT goals: Not progressing toward goals - comment (due to limited participation) ? ?Acute Rehab OT Goals ?Patient Stated Goal: go home ?OT Goal Formulation: With patient ?Time For Goal Achievement: 11/25/21 ?Potential to Achieve Goals: Good ?ADL Goals ?Pt Will Perform Upper Body Dressing: with set-up;with supervision;sitting ?Pt Will Perform Lower Body Dressing: with min guard assist;sitting/lateral leans;with adaptive equipment ?Pt Will Transfer to Toilet: with min guard assist;ambulating;regular height toilet ?Pt Will Perform Toileting - Clothing Manipulation and hygiene: with min guard assist;sitting/lateral leans;sit to/from stand  ?Plan Discharge plan remains appropriate   ? ?Co-evaluation ? ? ?   ?  ?  ?  ?  ? ?  ?AM-PAC OT "6 Clicks" Daily Activity     ?Outcome Measure ? ? Help from another person eating meals?: None ?Help from another person taking care of personal grooming?: A Little ?Help from another person toileting, which includes using toliet, bedpan, or urinal?: A Little ?Help from another person bathing (including washing, rinsing, drying)?: A Lot ?Help from another person to put on and taking off regular upper body clothing?: A Little ?Help from another person to put on and taking off regular lower body clothing?: A Little ?6 Click Score: 18 ? ?  ?End of Session   ? ?OT Visit Diagnosis: Unsteadiness on feet (R26.81);Other abnormalities of gait and mobility (R26.89);Muscle weakness (generalized) (M62.81) ?  ?Activity Tolerance Patient limited by fatigue ?  ?Patient Left in bed;with call bell/phone within reach;with nursing/sitter in room ?  ?Nurse Communication Mobility status ?  ? ?   ? ?Time: 4035-2481 ?OT Time Calculation (min): 14 min ? ?Charges: OT General Charges ?$OT Visit: 1 Visit ?OT Treatments ?$Therapeutic Activity: 8-22 mins ? ?Lodema Hong, OTA ?Acute Rehabilitation Services  ?Pager [phone] ?Office [phone] ? ? ?[place] ?11/15/2021, 12:13 PM ?

## 2021-11-15 NOTE — Procedures (Signed)
Interventional Radiology Procedure Note ? ?Procedure: US Guided Biopsy of liver ? ?Complications: None ? ?Estimated Blood Loss: < 10 mL ? ?Findings: ?Passage of trocar needle into solid appearing left lobe liver lesion and aspiration yielded no fluid. Other lesions in right lobe also appear solid by [place]. ? ?42 G core biopsy of left lobe liver lesion performed under US guidance.  Three core samples obtained. Two were sent to Path in formalin and one in saline for tissue culture. ? ?Sean Farrell, M.D ?Pager:  [phone] ? ?  ?

## 2021-11-15 NOTE — Care Management (Signed)
?  Transition of Care (TOC) Screening Note ? ? ?Patient Details  ?Name: Sean Farrell ?Date of Birth: 10-Jan-1946 ? ? ?Transition of Care (TOC) CM/SW Contact:    ?Carles Collet, RN ?Phone Number: ?11/15/2021, 7:50 AM ? ? ? ?Transition of Care Department [facility]) has reviewed patient and we will continue to monitor patient advancement through interdisciplinary progression rounds. ? ?At this time, HH therapies recommended, however determination of course of treatment will be influenced by pending liver biopsy. Level of support needed at discharge and plan for disposition may also change and TOC will will continue to follow. ?

## 2021-11-15 NOTE — Progress Notes (Incomplete Revision)
Call from lab, Quanterferon collected on 5/5 was hemolyzed and must be obtained again. Dr. Teryl Lucy notified by chat message ?

## 2021-11-15 NOTE — Progress Notes (Signed)
Call from lab, Quanterferon collected on 5/5 was hemolyzed and must be obtained again ?

## 2021-11-16 DIAGNOSIS — E43 Unspecified severe protein-calorie malnutrition: Secondary | ICD-10-CM | POA: Diagnosis present

## 2021-11-16 DIAGNOSIS — I5042 Chronic combined systolic (congestive) and diastolic (congestive) heart failure: Secondary | ICD-10-CM | POA: Diagnosis not present

## 2021-11-16 DIAGNOSIS — J181 Lobar pneumonia, unspecified organism: Secondary | ICD-10-CM | POA: Diagnosis not present

## 2021-11-16 DIAGNOSIS — I679 Cerebrovascular disease, unspecified: Secondary | ICD-10-CM | POA: Diagnosis not present

## 2021-11-16 DIAGNOSIS — Z8611 Personal history of tuberculosis: Secondary | ICD-10-CM | POA: Diagnosis not present

## 2021-11-16 LAB — ACID FAST SMEAR (AFB, MYCOBACTERIA)
Acid Fast Smear: NEGATIVE
Acid Fast Smear: NEGATIVE

## 2021-11-16 LAB — PATHOLOGIST SMEAR REVIEW

## 2021-11-16 MED ORDER — SODIUM CHLORIDE 0.9 % IV BOLUS
250.0000 mL | Freq: Once | INTRAVENOUS | Status: AC
  Administered 2021-11-16: 250 mL via INTRAVENOUS

## 2021-11-16 NOTE — Progress Notes (Signed)
? ?NAME:  Sean Farrell, MRN:  [id number], DOB:  1946/06/07, LOS: 6 ?ADMISSION DATE:  11/10/2021, CONSULTATION DATE:  11/10/21 ?REFERRING MD:  Sherren Mocha, MD CHIEF COMPLAINT:  Pneumonia  ? 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 He reports decreased appetite over recent months and has noted night sweats. He denies fevers and chills. He smokes cigarettes and marijuana regularly.  ? ?PCCM has been consulted for loculated pleural effusion and concern for TB.  ? ?Pertinent  Medical History  ?Hypertension ?Tobacco Abuse ?Mycobacterium Tuberculosis 2010 ?Hepatitis C ?Polysubstance Use ? ?Significant Hospital Events: ?Including procedures, antibiotic start and stop dates in addition to other pertinent events   ?5/5 admitted, left pigtail chest tube placed ?5/9 chest tube charted as putting out 340 mL; total output noted to be at 1450cc at 9:30  ?5/10 - 10 cc output in 24 hours from chest tube ?5/10 - IR guided liver biopsy ? ?Interim History / Subjective:  ? ?Flat affect. Minimal chest tube output in the last 24 hours. ?Says he feels like he doesn't know what's going on. ? ?Objective   ?Blood pressure (!) 89/51, pulse 65, temperature 98.4 ?F (36.9 ?C), temperature source Oral, resp. rate 20, height '6\' 2"'$  (1.88 m), weight 78.2 kg, SpO2 94 %. ?   ?   ? ?Intake/Output Summary (Last 24 hours) at 11/16/2021 0850 ?Last data filed at 11/16/2021 0345 ?Gross per 24 hour  ?Intake 370 ml  ?Output 400 ml  ?Net -30 ml  ? ?Filed Weights  ? 11/14/21 0406 11/15/21 0437 11/16/21 0300  ?Weight: 75.3 kg 74.9 kg 78.2 kg  ? ? ?Examination: ? ?Chronically ill appearing ?Decreased muscle mass ?RRR no mrg ?Lungs are clear, no wheezes or crackles, no increased wob ?Left sided pigtail drainage catheter  hooked up to drainage canister on suction, minimal serous output ? ?Chest xray reviewed 11/15/21 shows chest tube in position with small residual fluid collection.  ? ? ?Resolved Hospital Problem list   ? ? ?Assessment & Plan:  ?Bilateral pleural effusion L>R s/p pigtail drainage of left pleural space ?Multiple Pulmonary Nodules ?Concern for metastatic disease with multiple Liver Lesions and multiple bony lytic lesions in thoracic spin and bilateral ribs ?Severe Protein Calorie Malnutrition ? ?Discussion: ?Pleural fluid studies reviewed - exudative, highly cellular, lymphocyte predominant with negative cytology except reactive cells. Suspect most likely malignant vs less likely related to TB.  ? ?Plan: ?- given minimal chest tube output, feel the pigtail has probably drained as much as it can.  ?- discontinue chest tube. Order placed.  ?- I do not think he needs any further abx beyond the doxycycline he has already received for his lungs.  ?- follow results of IR guided biopsy of liver lesions. I suspect this is all one unifying process, likely malignancy.  ?- AFB sputums from 5/6, 5/7, and 5/8 are all negative. I do not think this is primary pulmonary tuberculosis and he can be taken out of airborne isolation.  ? ?PCCM will follow peripherally - please call us back if we can be of further assistance.  ? ?Lenice Llamas, MD ?Pulmonary and Critical Care Medicine ?[place] ?11/16/2021 9:02 AM ?Pager: see AMION ? ?If no response to pager, please call critical care on call (see AMION) until 7pm ?After 7:00  pm call Elink   ? ? ?Best Practice (right click and "Reselect all SmartList Selections" daily)  ? ?Per Primary ? ?Critical care time:   ? ?Roselie Awkward, MD ?[facility] ?Pager: [phone] ?Cell: [phone] ?After 7:00 pm call Elink  [phone] ? ?11/16/2021, 8:50 AM  ? ?  ?

## 2021-11-16 NOTE — Progress Notes (Signed)
? ?PROGRESS NOTE ? ? ? ?Sean Farrell  HKV:[id number] DOB: 02/04/1946 DOA: 11/10/2021 ?PCP: Nolene Ebbs, MD ? ? ?Brief Narrative: ?Sean Farrell is a 77 y.o. M with hx stroke, smoking, hx TB in 2010, and chronic heart failure EF 40-45% who presented with cough, SOB and night sweats for maybe a month. ? ?In the ER, CXR showed cavitary PNA, effusion. ? ? ?5/5: Admitted on airborne precautions, chest tube placed, Pulm consulted, CT showing multiple liver lesions, bony lytic lesions, bilateral lung nodules, no obvious primary ?5/6: Obtaining CT abd/pel --> shows pancreatic tail mass; tPA/dornase x1 ?5/7: IR consulted for liver mass biopsy; lytics second dose ?5/10: To IR for liver US and biopsy vs drain placement ? ? ?Assessment and Plan: ? ?Lung consolidation ?Probable pneumonia ?Noted on imaging. Started on empiric antibiotic therapy with Vancomycin, Cefepime and azithromycin and transitioned to Ceftriaxone and doxycycline and finally to Zosyn and doxycycline. Leukocytosis persistent. Associated fevers. Completed doxycycline course. ?-Continue Zosyn ? ?Leukocytosis ?Persistent and significantly elevated. In setting of above likely infection vs malignancy. ?-Path smear review pending ?-CBC in AM ? ?Loculated pleural effusion ?Left chest tube placed on 5/5. PCCM managing. ? ?History of tuberculosis ?Pleural fluid with negative AFB. Concern for possible active TB. Morning AFB x3 ordered to rule out active TB and negative. Airborne precautions discontinued. ? ?Lytic bone lesions ?In setting of long nodules and liver lesions. Concern for metastatic process. Liver biopsy performed with pathology pending. ? ?Liver lesions ?CT chest and abdomen/pelvis significant for multiple liver lesions concerning for metastatic disease. Radiology consulted for liver biopsy. Liver biopsy performed on 5/10. ? ?Lung nodules ?CT chest from 5/5 significant for multiple small noncalcified pulmonary nodules; possible malignancy. ? ?Sinus  tachycardia ?Resolved. ? ?Hyperbilirubinemia ?Mild. Improving. ? ?Metabolic acidosis ?Mild and now resolved. ? ?Thrombocytosis ?Likely reactive. ? ?Normocytic anemia ?Chronic. Stable. ? ?Cerebrovascular disease ?Not currently on aspirin or statin. ? ?Chronic combined systolic and diastolic heart failure ?LVEF of 40-45% from Transthoracic Echocardiogram on 07/08/17. Stable. ?-Continue metoprolol ? ?Tobacco use ?-Continue nicotine patch ? ?Hyponatremia ?Mild. Stable. ? ?Primary hypertension ?Well controlled. ?-Continue amlodipine and doxycycline ? ?DVT prophylaxis: Lovenox ?Code Status:   Code Status: Full Code ?Family Communication: None at bedside ?Disposition Plan: Discharge home pending ability to transition to outpatient antibiotics if needed, removal of chest tube, improvement of leukocytosis, afebrile. Likely not home for 1-3 days. ? ? ?Consultants:  ?PCCM ?Interventional radiology ? ?Procedures:  ?Chest tube insertion (5/5) ? ?Antimicrobials: ?Vancomycin ?Doxycycline ?Cefepime ?Ceftriaxone ?Azithromycin ?Zosyn  ? ? ?Subjective: ?Patient is annoyed today. No concerns. ? ?Objective: ?BP (!) 89/51 (BP Location: Right Arm)   Pulse 65   Temp 98.4 ?F (36.9 ?C) (Oral)   Resp 20   Ht '6\' 2"'$  (1.88 m)   Wt 78.2 kg   SpO2 94%   BMI 22.13 kg/m?  ? ?Examination: ? ?General exam: Appears calm and comfortable ?Respiratory system: Respiratory effort normal. ?Cardiovascular system: S1 & S2 heard, RRR. ?Gastrointestinal system: Abdomen is nondistended, soft and nontender. Normal bowel sounds heard. ?Central nervous system: Alert and oriented. No focal neurological deficits. ?Musculoskeletal: No edema. No calf tenderness ?Skin: No cyanosis. No rashes ?Psychiatry: Judgement and insight appear normal. Mood & affect appropriate.  ? ? ?Data Reviewed: I have personally reviewed following labs and imaging studies ? ?CBC ?Lab Results  ?Component Value Date  ? WBC 40.7 (H) 11/15/2021  ? RBC 3.16 (L) 11/15/2021  ? HGB 9.3 (L)  11/15/2021  ? HCT 28.0 (L) 11/15/2021  ?  MCV 88.6 11/15/2021  ? MCH 29.4 11/15/2021  ? PLT 411 (H) 11/15/2021  ? MCHC 33.2 11/15/2021  ? RDW 14.1 11/15/2021  ? LYMPHSABS 1.6 11/11/2021  ? MONOABS 3.0 (H) 11/11/2021  ? EOSABS 0.1 11/11/2021  ? BASOSABS 0.2 (H) 11/11/2021  ? ? ? ?Last metabolic panel ?Lab Results  ?Component Value Date  ? NA 134 (L) 11/15/2021  ? K 3.5 11/15/2021  ? CL 105 11/15/2021  ? CO2 23 11/15/2021  ? BUN 16 11/15/2021  ? CREATININE 0.76 11/15/2021  ? GLUCOSE 123 (H) 11/15/2021  ? GFRNONAA >60 11/15/2021  ? GFRAA >60 07/09/2017  ? CALCIUM 9.0 11/15/2021  ? PROT 7.1 11/14/2021  ? ALBUMIN 2.0 (L) 11/14/2021  ? BILITOT 1.9 (H) 11/14/2021  ? ALKPHOS 143 (H) 11/14/2021  ? AST 54 (H) 11/14/2021  ? ALT 19 11/14/2021  ? ANIONGAP 6 11/15/2021  ? ? ?GFR: ?Estimated Creatinine Clearance: 89.6 mL/min (by C-G formula based on SCr of 0.76 mg/dL). ? ?Recent Results (from the past 240 hour(s))  ?Resp Panel by RT-PCR (Flu A&B, Covid) Nasopharyngeal Swab     Status: None  ? Collection Time: 11/10/21 10:02 AM  ? Specimen: Nasopharyngeal Swab; Nasopharyngeal(NP) swabs in vial transport medium  ?Result Value Ref Range Status  ? SARS Coronavirus 2 by RT PCR NEGATIVE NEGATIVE Final  ?  Comment: (NOTE) ?SARS-CoV-2 target nucleic acids are NOT DETECTED. ? ?The SARS-CoV-2 RNA is generally detectable in upper respiratory ?specimens during the acute phase of infection. The lowest ?concentration of SARS-CoV-2 viral copies this assay can detect is ?138 copies/mL. A negative result does not preclude SARS-Cov-2 ?infection and should not be used as the sole basis for treatment or ?other patient management decisions. A negative result may occur with  ?improper specimen collection/handling, submission of specimen other ?than nasopharyngeal swab, presence of viral mutation(s) within the ?areas targeted by this assay, and inadequate number of viral ?copies(<138 copies/mL). A negative result must be combined with ?clinical  observations, patient history, and epidemiological ?information. The expected result is Negative. ? ?Fact Sheet for Patients:  ?EntrepreneurPulse.com.au ? ?Fact Sheet for Healthcare Providers:  ?IncredibleEmployment.be ? ?This test is no t yet approved or cleared by the [place] FDA and  ?has been authorized for detection and/or diagnosis of SARS-CoV-2 by ?FDA under an Emergency Use Authorization (EUA). This EUA will remain  ?in effect (meaning this test can be used) for the duration of the ?COVID-19 declaration under Section 564(b)(1) of the Act, 21 ?U.S.C.section 360bbb-3(b)(1), unless the authorization is terminated  ?or revoked sooner.  ? ? ?  ? Influenza A by PCR NEGATIVE NEGATIVE Final  ? Influenza B by PCR NEGATIVE NEGATIVE Final  ?  Comment: (NOTE) ?The Xpert Xpress SARS-CoV-2/FLU/RSV plus assay is intended as an aid ?in the diagnosis of influenza from Nasopharyngeal swab specimens and ?should not be used as a sole basis for treatment. Nasal washings and ?aspirates are unacceptable for Xpert Xpress SARS-CoV-2/FLU/RSV ?testing. ? ?Fact Sheet for Patients: ?EntrepreneurPulse.com.au ? ?Fact Sheet for Healthcare Providers: ?IncredibleEmployment.be ? ?This test is not yet approved or cleared by the [place] FDA and ?has been authorized for detection and/or diagnosis of SARS-CoV-2 by ?FDA under an Emergency Use Authorization (EUA). This EUA will remain ?in effect (meaning this test can be used) for the duration of the ?COVID-19 declaration under Section 564(b)(1) of the Act, 21 U.S.C. ?section 360bbb-3(b)(1), unless the authorization is terminated or ?revoked. ? ?Performed at [facility], [place] [address] [place], [place] ?[address] ?  ?  Respiratory (~20 pathogens) panel by PCR     Status: None  ? Collection Time: 11/10/21 10:02 AM  ? Specimen: Nasopharyngeal Swab; Respiratory  ?Result Value Ref Range Status  ? Adenovirus NOT  DETECTED NOT DETECTED Final  ? Coronavirus 229E NOT DETECTED NOT DETECTED Final  ?  Comment: (NOTE) ?The Coronavirus on the Respiratory Panel, DOES NOT test for the novel  ?Coronavirus (2019 nCoV) ?  ? Coronavirus HKU1 NOT DETECTED

## 2021-11-16 NOTE — Progress Notes (Signed)
Md paged regarding patient's low BP readings of  60s over 40s. NS 228m bolus given. BP is now 92/52 automatic and 88/50 manual. Monitoring BP and will give additional bolus if needed.  ?

## 2021-11-16 NOTE — Progress Notes (Signed)
Physical Therapy Treatment ?Patient Details ?Name: Sean Farrell ?MRN: [id number] ?DOB: 09-12-1945 ?Today's Date: 11/16/2021 ? ? ?History of Present Illness 76 yo male presenting to ED on 5/5 with SOB and back pain. Chest CT showing concern for large cavitary lesion in the right upper lobe abutting the mediastinum. S/p L pigtail chest tube placed on 5/5. Liver biopsy 5/10. Onset of Afib during PT tx session 5/11, RN/MD notified. PMH including HTN, polysubstance abuse, mycobacterium tuberculosis  (2010), CHF, Hep C, and L ankle sx. ? ?  ?PT Comments  ? ? Pt received in supine, agreeable to therapy session with goal of progressing ambulation tolerance and pt requesting to walk to bathroom. Pt HR spiked to 186 bpm during ambulation from bathroom back to supine, SpO2 noisy signal but pt noted to be dyspneic to 2/4. MD/RN notified of pt unstable vitals. Pt also with loose stools and bed soiled prior to gait trial. Pt needing increased assist this date to stand from lower surfaces compared with initial evaluation, may need to reconsider disposition pending medical course/next session progress. Pt continues to benefit from PT services to progress toward functional mobility goals.   ?Recommendations for follow up therapy are one component of a multi-disciplinary discharge planning process, led by the attending physician.  Recommendations may be updated based on patient status, additional functional criteria and insurance authorization. ? ?Follow Up Recommendations ? Home health PT ?  ?  ?Assistance Recommended at Discharge Frequent or constant Supervision/Assistance  ?Patient can return home with the following A little help with walking and/or transfers;A little help with bathing/dressing/bathroom;Assistance with cooking/housework;Direct supervision/assist for medications management;Direct supervision/assist for financial management;Assist for transportation ?  ?Equipment Recommendations ? Rolling walker (2 wheels)  ?   ?Recommendations for Other Services   ? ? ?  ?Precautions / Restrictions Precautions ?Precautions: Fall;Other (comment) ?Precaution Comments: L CT to suction, extreme tachycardia 5/11 ?Restrictions ?Weight Bearing Restrictions: No  ?  ? ?Mobility ? Bed Mobility ?Overal bed mobility: Needs Assistance ?Bed Mobility: Supine to Sit, Sit to Supine ?  ?  ?Supine to sit: HOB elevated, Min guard ?Sit to supine: Min guard ?  ?General bed mobility comments: cues for line mgmt, min guard for safety, increased time/effort to perform ?  ? ?Transfers ?Overall transfer level: Needs assistance ?Equipment used: None ?Transfers: Sit to/from Stand ?Sit to Stand: Min assist, Mod assist ?  ?  ?  ?  ?  ?General transfer comment: from lowest bed height with minA to RW, then from low toilet seat to RW with wall rail and modA. Pt unable to stand from toilet with min guard and no wall rail on first trial. ?  ? ?Ambulation/Gait ?Ambulation/Gait assistance: Min guard ?Gait Distance (Feet): 20 Feet (63f to toilet, seated break, then 154fto nearest side of bed) ?Assistive device: Rolling walker (2 wheels) ?Gait Pattern/deviations: Decreased stride length, Trunk flexed, Narrow base of support ?Gait velocity: decreased ?  ?  ?General Gait Details: HR noted to be elevated to 130's bpm during ambulation to bathroom, then once standing after toileting HR spiked to 186 bpm while getting peri-care. Pt urged to pivot to recliner for safety but defers and assisted to ambulate back to nearest edge of bed. HR slowly decreased to 130's bpm while seated and to 110's bpm after return to supine. SpO2 noisy signal but possibly desat as pt with DOE 2/4 with short distance gait, he denies lightheadedness. ? ? ? ?  ?Balance Overall balance assessment: Mild deficits observed, not formally  tested ?Sitting-balance support: No upper extremity supported, Feet supported ?Sitting balance-Leahy Scale: Good ?  ?  ?Standing balance support: During functional  activity ?Standing balance-Leahy Scale: Fair ?Standing balance comment: one extremity support for static standing, benefits from RW for dynamic tasks ?  ?  ?  ?  ?  ?  ?  ?  ?  ?  ?  ?  ? ?  ?Cognition Arousal/Alertness: Awake/alert ?Behavior During Therapy: Flat affect ?Overall Cognitive Status: Difficult to assess ?  ?  ?  ?  ?  ?General Comments: Pt uncomfortable due to frequent diarrhea today, pt reports "I am a loner" and not very conversant, denies depression. Pt denies sx when HR noted to be very elevated. ?  ?  ? ?  ?Exercises   ? ?  ?General Comments General comments (skin integrity, edema, etc.): HR elevated 130's bpm with first gait trial, more elevated while sitting on toilet for BM and observed as high as 186 bpm with standing for peri-care and return ambulation from bathroom to bed. RN/MD notified ?  ?  ? ?Pertinent Vitals/Pain Pain Assessment ?Pain Assessment: Faces ?Faces Pain Scale: Hurts a little bit ?Pain Location: CT insertion site/abdominal discomfort (diarrhea) ?Pain Descriptors / Indicators: Discomfort, Grimacing ?Pain Intervention(s): Limited activity within patient's tolerance, Monitored during session, Repositioned  ? ? ? ?PT Goals (current goals can now be found in the care plan section) Acute Rehab PT Goals ?Patient Stated Goal: did not state; to go home after hospitalization ?PT Goal Formulation: With patient ?Time For Goal Achievement: 11/25/21 ?Progress towards PT goals: Progressing toward goals (slow progress due to fatigue/unstable vitals) ? ?  ?Frequency ? ? ? Min 3X/week ? ? ? ?  ?PT Plan Current plan remains appropriate  ? ? ?   ?AM-PAC PT "6 Clicks" Mobility   ?Outcome Measure ? Help needed turning from your back to your side while in a flat bed without using bedrails?: A Little ?Help needed moving from lying on your back to sitting on the side of a flat bed without using bedrails?: A Little ?Help needed moving to and from a bed to a chair (including a wheelchair)?: A  Little ?Help needed standing up from a chair using your arms (e.g., wheelchair or bedside chair)?: A Lot ?Help needed to walk in hospital room?: A Little ?Help needed climbing 3-5 steps with a railing? : Total ?6 Click Score: 15 ? ?  ?End of Session   ?Activity Tolerance: Treatment limited secondary to medical complications (Comment);Other (comment) (extreme tachycardia, diarrhea) ?Patient left: in bed;with call bell/phone within reach;with bed alarm set;Other (comment) (pt heels floated) ?Nurse Communication: Mobility status;Other (comment) (unstable vitals, CT needs to be returned to suction (noticed after therapist left unit, RN called ~5 mins post-session)) ?PT Visit Diagnosis: Unsteadiness on feet (R26.81);Difficulty in walking, not elsewhere classified (R26.2) ?  ? ? ?Time: 4401-0272 ?PT Time Calculation (min) (ACUTE ONLY): 34 min ? ?Charges:  $Gait Training: 8-22 mins ?$Therapeutic Activity: 8-22 mins          ?          ? ?Shalunda Lindh P., PTA ?Acute Rehabilitation Services ?Secure Chat Preferred 9a-5:30pm ?Office: [phone]  ? ? ?Deane Wattenbarger M Jorgeluis Gurganus ?11/16/2021, 6:47 PM ? ?

## 2021-11-16 NOTE — Plan of Care (Signed)
?  Problem: Respiratory: ?Goal: Ability to maintain adequate ventilation will improve ?Outcome: Progressing ?Goal: Ability to maintain a clear airway will improve ?Outcome: Progressing ?  ?Problem: Education: ?Goal: Knowledge of General Education information will improve ?Description: Including pain rating scale, medication(s)/side effects and non-pharmacologic comfort measures ?Outcome: Progressing ?  ?Problem: Coping: ?Goal: Level of anxiety will decrease ?Outcome: Progressing ?  ?Problem: Elimination: ?Goal: Will not experience complications related to bowel motility ?Outcome: Progressing ?Goal: Will not experience complications related to urinary retention ?Outcome: Progressing ?  ?Problem: Pain Managment: ?Goal: General experience of comfort will improve ?Outcome: Progressing ?  ?Problem: Safety: ?Goal: Ability to remain free from injury will improve ?Outcome: Progressing ?  ?Problem: Skin Integrity: ?Goal: Risk for impaired skin integrity will decrease ?Outcome: Progressing ?  ?

## 2021-11-16 NOTE — Progress Notes (Signed)
Nutrition Follow-up ? ?DOCUMENTATION CODES:  ? ?Severe malnutrition in context of chronic illness ? ?INTERVENTION:  ? ?If within [place], consider Cortrak placement and initiation of enteral nutrition: ?- Start Osmolite 1.5 @ 20 ml/hr and advance by 10 ml q 6 hours to goal rate of 60 ml/hr (1440 ml/day) ?- ProSource TF 45 ml BID ? ?Recommended tube feeding regimen at goal rate would provide 2240 kcal, 112 grams of protein, and 1097 ml of H2O. ? ?- Continue Ensure Enlive po TID, each supplement provides 350 kcal and 20 grams of protein ? ?- Continue Magic Cup TID with meals, each supplement provides 290 kcal and 9 grams of protein ? ?- Continue MVI with minerals daily ? ?- Encourage PO intake and provide feeding assistance as needed ? ?NUTRITION DIAGNOSIS:  ? ?Severe Malnutrition related to chronic illness (CHF, polysubstance abuse, cavitary lesion) as evidenced by severe fat depletion, severe muscle depletion. ? ?Ongoing, being addressed via oral nutrition supplements ? ?GOAL:  ? ?Patient will meet greater than or equal to 90% of their needs ? ?Progressing ? ?MONITOR:  ? ?PO intake, Supplement acceptance, Labs, Weight trends ? ?REASON FOR ASSESSMENT:  ? ?Consult ?Assessment of nutrition requirement/status ? ?ASSESSMENT:  ? ?76 year old male who presented to the ED on 5/05 with SOB, back pain, dehydration. PMH of tuberculosis (2012), hepatitis C, polysubstance abuse, HTN, CHF. Pt admitted with PNA, cavitary lesion in LLL. ? ?05/05 - s/p L chest tube placement ?05/10 - s/p biopsy of liver ?05/11 - chest tube to be removed ? ?Spoke with pt at bedside. Pt resting at time of RD visit but awoke easily. Noted untouched lunch meal tray on tray table along with unopened Ensure supplement. Pt declines both of these but requesting ice water which RD provided. Pt reports he is eating "some." He states that he drank 1 Ensure supplement yesterday but has not consumed any supplements today. ? ?Per notes, pt likely with malignancy.  Liver biopsy results pending. ? ?Pt is severely malnourished with poor PO intake since admission (x 6 days). If within [place], consider Cortrak placement and initiation of enteral nutrition. Tube feeding recommendations left above. ? ?Admit weight: 73.9 kg ?Current weight: 78.2 kg ? ?Pt with mild pitting edema to BLE. ? ?Meal Completion: 0-80% ? ?Medications reviewed and include: colace, Ensure Enlive TID, MVI with minerals daily, IV abx ? ?Labs reviewed. ? ?UOP: 500 ml x 24 hours ?CT: 100 ml x 24 hours ?I/O's: -1.7 L since admit ? ?NUTRITION - FOCUSED PHYSICAL EXAM: ? ?Flowsheet Row Most Recent Value  ?Orbital Region Severe depletion  ?Upper Arm Region Severe depletion  ?Thoracic and Lumbar Region Severe depletion  ?Buccal Region Severe depletion  ?Temple Region Severe depletion  ?Clavicle Bone Region Severe depletion  ?Clavicle and Acromion Bone Region Severe depletion  ?Scapular Bone Region Severe depletion  ?Dorsal Hand Severe depletion  ?Patellar Region Severe depletion  ?Anterior Thigh Region Severe depletion  ?Posterior Calf Region Severe depletion  ?Edema (RD Assessment) None  ?Hair Reviewed  ?Eyes Reviewed  ?Mouth Reviewed  ?Skin Reviewed  ?Nails Reviewed  ? ?  ? ? ?Diet Order:   ?Diet Order   ? ?       ?  Diet regular Room service appropriate? Yes; Fluid consistency: Thin  Diet effective now       ?  ? ?  ?  ? ?  ? ? ?EDUCATION NEEDS:  ? ?Education needs have been addressed ? ?Skin:  Skin Assessment: Reviewed RN Assessment ? ?Last  BM:  11/15/21 ? ?Height:  ? ?Ht Readings from Last 1 Encounters:  ?11/10/21 '6\' 2"'$  (1.88 m)  ? ? ?Weight:  ? ?Wt Readings from Last 1 Encounters:  ?11/16/21 78.2 kg  ? ? ?BMI:  Body mass index is 22.13 kg/m?. ? ?Estimated Nutritional Needs:  ? ?Kcal:  2200-2400 ? ?Protein:  110-125 grams ? ?Fluid:  >/= 2.0 L ? ? ? ?Gustavus Bryant, MS, RD, LDN ?Inpatient Clinical Dietitian ?Please see AMiON for contact information. ? ?

## 2021-11-17 DIAGNOSIS — I5042 Chronic combined systolic (congestive) and diastolic (congestive) heart failure: Secondary | ICD-10-CM | POA: Diagnosis not present

## 2021-11-17 DIAGNOSIS — Z8611 Personal history of tuberculosis: Secondary | ICD-10-CM | POA: Diagnosis not present

## 2021-11-17 DIAGNOSIS — I679 Cerebrovascular disease, unspecified: Secondary | ICD-10-CM | POA: Diagnosis not present

## 2021-11-17 DIAGNOSIS — J181 Lobar pneumonia, unspecified organism: Secondary | ICD-10-CM | POA: Diagnosis not present

## 2021-11-17 LAB — CBC
HCT: 28.9 % — ABNORMAL LOW (ref 39.0–52.0)
Hemoglobin: 9.4 g/dL — ABNORMAL LOW (ref 13.0–17.0)
MCH: 29 pg (ref 26.0–34.0)
MCHC: 32.5 g/dL (ref 30.0–36.0)
MCV: 89.2 fL (ref 80.0–100.0)
Platelets: 441 10*3/uL — ABNORMAL HIGH (ref 150–400)
RBC: 3.24 MIL/uL — ABNORMAL LOW (ref 4.22–5.81)
RDW: 14.5 % (ref 11.5–15.5)
WBC: 36.5 10*3/uL — ABNORMAL HIGH (ref 4.0–10.5)
nRBC: 0 % (ref 0.0–0.2)

## 2021-11-17 LAB — MISC LABCORP TEST (SEND OUT): Labcorp test code: 830893

## 2021-11-17 NOTE — Care Management Important Message (Signed)
Important Message ? ?Patient Details  ?Name: Sean Farrell ?MRN: [id number] ?Date of Birth: 09/04/1945 ? ? ?Medicare Important Message Given:  Yes ? ? ? ? ?Mabry Tift ?11/17/2021, 3:01 PM ?

## 2021-11-17 NOTE — Progress Notes (Signed)
? ?PROGRESS NOTE ? ? ? ?ICKER SWIGERT  OEV:[id number] DOB: 1946/02/09 DOA: 11/10/2021 ?PCP: Nolene Ebbs, MD ? ? ?Brief Narrative: ?Mr. Stupka is a 76 y.o. M with hx stroke, smoking, hx TB in 2010, and chronic heart failure EF 40-45% who presented with cough, SOB and night sweats for maybe a month. ? ?In the ER, CXR showed cavitary PNA, effusion. ? ? ?5/5: Admitted on airborne precautions, chest tube placed, Pulm consulted, CT showing multiple liver lesions, bony lytic lesions, bilateral lung nodules, no obvious primary ?5/6: Obtaining CT abd/pel --> shows pancreatic tail mass; tPA/dornase x1 ?5/7: IR consulted for liver mass biopsy; lytics second dose ?5/10: To IR for liver US and biopsy vs drain placement ? ? ?Assessment and Plan: ? ?Lung consolidation ?Probable pneumonia ?Noted on imaging. Started on empiric antibiotic therapy with Vancomycin, Cefepime and azithromycin and transitioned to Ceftriaxone and doxycycline and finally to Zosyn and doxycycline. Leukocytosis persistent. Associated fevers. Completed doxycycline course. ?-Continue Zosyn; complete 7 day course ? ?Leukocytosis ?Persistent and significantly elevated. In setting of above likely infection vs malignancy. Pathology smear review without evidence of neoplastic process. ?-CBC in AM ? ?Loculated pleural effusion ?Left chest tube placed on 5/5. PCCM managing. ? ?History of tuberculosis ?Pleural fluid with negative AFB. Concern for possible active TB. Morning AFB x3 ordered to rule out active TB and negative. Airborne precautions discontinued. ? ?Lytic bone lesions ?In setting of long nodules and liver lesions. Concern for metastatic process. Liver biopsy performed with pathology pending. ? ?Liver lesions ?CT chest and abdomen/pelvis significant for multiple liver lesions concerning for metastatic disease. Radiology consulted for liver biopsy. Liver biopsy performed on 5/10. Pathology is pending. ?-Will discuss with oncology to consider outpatient  follow-up to avoid patient being lost to follow-up ? ?Lung nodules ?CT chest from 5/5 significant for multiple small noncalcified pulmonary nodules; possible malignancy. ? ?Sinus tachycardia ?Resolved. ? ?Hyperbilirubinemia ?Mild. Improving. ? ?Metabolic acidosis ?Mild and now resolved. ? ?Thrombocytosis ?Likely reactive. ? ?Normocytic anemia ?Chronic. Stable. ? ?Cerebrovascular disease ?Not currently on aspirin or statin. ? ?Chronic combined systolic and diastolic heart failure ?LVEF of 40-45% from Transthoracic Echocardiogram on 07/08/17. Stable. ?-Continue metoprolol ? ?Tobacco use ?-Continue nicotine patch ? ?Hyponatremia ?Mild. Stable. ? ?Primary hypertension ?Well controlled. ?-Continue amlodipine and doxycycline ? ?DVT prophylaxis: Lovenox ?Code Status:   Code Status: Full Code ?Family Communication: None at bedside ?Disposition Plan: Discharge home pending ability to transition to outpatient antibiotics if needed, removal of chest tube, improvement of leukocytosis, afebrile. Likely not home for 1-2 days. ? ? ?Consultants:  ?PCCM ?Interventional radiology ? ?Procedures:  ?Chest tube insertion (5/5) ? ?Antimicrobials: ?Vancomycin ?Doxycycline ?Cefepime ?Ceftriaxone ?Azithromycin ?Zosyn  ? ? ?Subjective: ?Febrile this morning, however, fever curve is still trending down. Tmax if 100.6 ?F. No patient concerns. ? ?Objective: ?BP 139/65   Pulse (!) 110   Temp (!) 100.6 ?F (38.1 ?C) (Oral)   Resp 14   Ht '6\' 2"'$  (1.88 m)   Wt 78.4 kg   SpO2 96%   BMI 22.19 kg/m?  ? ?Examination: ? ?General exam: Appears calm and comfortable ?Respiratory system: Clear to auscultation. Respiratory effort normal. ?Cardiovascular system: S1 & S2 heard, Normal rate with regular rhythm. ?Gastrointestinal system: Abdomen is nondistended, soft and nontender. Normal bowel sounds heard. ?Central nervous system: Alert and oriented. No focal neurological deficits. ?Musculoskeletal: No edema. No calf tenderness ?Skin: No cyanosis. No  rashes ?Psychiatry: Judgement and insight appear normal. Mood & affect appropriate.   ? ? ?Data Reviewed: I  have personally reviewed following labs and imaging studies ? ?CBC ?Lab Results  ?Component Value Date  ? WBC 36.5 (H) 11/17/2021  ? RBC 3.24 (L) 11/17/2021  ? HGB 9.4 (L) 11/17/2021  ? HCT 28.9 (L) 11/17/2021  ? MCV 89.2 11/17/2021  ? MCH 29.0 11/17/2021  ? PLT 441 (H) 11/17/2021  ? MCHC 32.5 11/17/2021  ? RDW 14.5 11/17/2021  ? LYMPHSABS 1.6 11/11/2021  ? MONOABS 3.0 (H) 11/11/2021  ? EOSABS 0.1 11/11/2021  ? BASOSABS 0.2 (H) 11/11/2021  ? ? ? ?Last metabolic panel ?Lab Results  ?Component Value Date  ? NA 134 (L) 11/15/2021  ? K 3.5 11/15/2021  ? CL 105 11/15/2021  ? CO2 23 11/15/2021  ? BUN 16 11/15/2021  ? CREATININE 0.76 11/15/2021  ? GLUCOSE 123 (H) 11/15/2021  ? GFRNONAA >60 11/15/2021  ? GFRAA >60 07/09/2017  ? CALCIUM 9.0 11/15/2021  ? PROT 7.1 11/14/2021  ? ALBUMIN 2.0 (L) 11/14/2021  ? BILITOT 1.9 (H) 11/14/2021  ? ALKPHOS 143 (H) 11/14/2021  ? AST 54 (H) 11/14/2021  ? ALT 19 11/14/2021  ? ANIONGAP 6 11/15/2021  ? ? ?GFR: ?Estimated Creatinine Clearance: 89.8 mL/min (by C-G formula based on SCr of 0.76 mg/dL). ? ?Recent Results (from the past 240 hour(s))  ?Resp Panel by RT-PCR (Flu A&B, Covid) Nasopharyngeal Swab     Status: None  ? Collection Time: 11/10/21 10:02 AM  ? Specimen: Nasopharyngeal Swab; Nasopharyngeal(NP) swabs in vial transport medium  ?Result Value Ref Range Status  ? SARS Coronavirus 2 by RT PCR NEGATIVE NEGATIVE Final  ?  Comment: (NOTE) ?SARS-CoV-2 target nucleic acids are NOT DETECTED. ? ?The SARS-CoV-2 RNA is generally detectable in upper respiratory ?specimens during the acute phase of infection. The lowest ?concentration of SARS-CoV-2 viral copies this assay can detect is ?138 copies/mL. A negative result does not preclude SARS-Cov-2 ?infection and should not be used as the sole basis for treatment or ?other patient management decisions. A negative result may occur with   ?improper specimen collection/handling, submission of specimen other ?than nasopharyngeal swab, presence of viral mutation(s) within the ?areas targeted by this assay, and inadequate number of viral ?copies(<138 copies/mL). A negative result must be combined with ?clinical observations, patient history, and epidemiological ?information. The expected result is Negative. ? ?Fact Sheet for Patients:  ?EntrepreneurPulse.com.au ? ?Fact Sheet for Healthcare Providers:  ?IncredibleEmployment.be ? ?This test is no t yet approved or cleared by the [place] FDA and  ?has been authorized for detection and/or diagnosis of SARS-CoV-2 by ?FDA under an Emergency Use Authorization (EUA). This EUA will remain  ?in effect (meaning this test can be used) for the duration of the ?COVID-19 declaration under Section 564(b)(1) of the Act, 21 ?U.S.C.section 360bbb-3(b)(1), unless the authorization is terminated  ?or revoked sooner.  ? ? ?  ? Influenza A by PCR NEGATIVE NEGATIVE Final  ? Influenza B by PCR NEGATIVE NEGATIVE Final  ?  Comment: (NOTE) ?The Xpert Xpress SARS-CoV-2/FLU/RSV plus assay is intended as an aid ?in the diagnosis of influenza from Nasopharyngeal swab specimens and ?should not be used as a sole basis for treatment. Nasal washings and ?aspirates are unacceptable for Xpert Xpress SARS-CoV-2/FLU/RSV ?testing. ? ?Fact Sheet for Patients: ?EntrepreneurPulse.com.au ? ?Fact Sheet for Healthcare Providers: ?IncredibleEmployment.be ? ?This test is not yet approved or cleared by the [place] FDA and ?has been authorized for detection and/or diagnosis of SARS-CoV-2 by ?FDA under an Emergency Use Authorization (EUA). This EUA will remain ?in effect (meaning this  test can be used) for the duration of the ?COVID-19 declaration under Section 564(b)(1) of the Act, 21 U.S.C. ?section 360bbb-3(b)(1), unless the authorization is terminated  or ?revoked. ? ?Performed at [facility], [place] [address], [place], [place] ?[address] ?  ?Respiratory (~20 pathogens) panel by PCR     Status: None  ? Collection Time: 11/10/21 10:02 AM  ? Specimen: Nasopharyngeal Swab; Respirator

## 2021-11-17 NOTE — Progress Notes (Signed)
? ?  NAME:  Sean Farrell, MRN:  [id number], DOB:  06-13-1946, LOS: 7 ?ADMISSION DATE:  11/10/2021, CONSULTATION DATE:  11/10/21 ?REFERRING MD:  Sherren Mocha, MD CHIEF COMPLAINT:  Pneumonia  ? 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 He reports decreased appetite over recent months and has noted night sweats. He denies fevers and chills. He smokes cigarettes and marijuana regularly.  ? ?PCCM has been consulted for loculated pleural effusion and concern for TB.  ? ?Pertinent  Medical History  ?Hypertension ?Tobacco Abuse ?Mycobacterium Tuberculosis 2010 ?Hepatitis C ?Polysubstance Use ? ?Significant Hospital Events: ?Including procedures, antibiotic start and stop dates in addition to other pertinent events   ?5/5 admitted, left pigtail chest tube placed ?5/9 chest tube charted as putting out 340 mL; total output noted to be at 1450cc at 9:30  ?5/10 - 10 cc output in 24 hours from chest tube ?5/10 - IR guided liver biopsy ?5/11 -chest tube removed ? ?Interim History / Subjective:  ? ?Flat affect. Minimal chest tube output in the last 24 hours. ?Has no new complaints, no questions ? ?Objective   ?Blood pressure 119/63, pulse (!) 109, temperature 98.8 ?F (37.1 ?C), temperature source Oral, resp. rate 18, height '6\' 2"'$  (1.88 m), weight 78.4 kg, SpO2 96 %. ?   ?   ? ?Intake/Output Summary (Last 24 hours) at 11/17/2021 1220 ?Last data filed at 11/17/2021 0630 ?Gross per 24 hour  ?Intake 240 ml  ?Output 300 ml  ?Net -60 ml  ? ?Filed Weights  ? 11/15/21 0437 11/16/21 0300 11/17/21 0500  ?Weight: 74.9 kg 78.2 kg 78.4 kg  ? ? ?Examination: ? ?Chronically ill-appearing ?[place] air entry bilaterally ?Decreased air movement at the base on the left ?S1-S2 appreciated ? ? ?Last chest x-ray  11/15/2021 with pleural tube in place ? ?Resolved Hospital Problem list   ? ? ?Assessment & Plan:  ? ?Bilateral pleural effusion L>R s/p pigtail drainage of left pleural space ?Multiple Pulmonary Nodules ?Concern for metastatic disease with multiple Liver Lesions and multiple bony lytic lesions in thoracic spin and bilateral ribs ?Severe Protein Calorie Malnutrition ? ?Discussion: ?Exudative effusion, cytology negative ? ?Chest tube was discontinued 11/16/2021 ? ?Follow-up with chest x-ray ?Complete course of doxycycline ? ?AFB negative, airborne isolation discontinued ? ?We will follow peripherally ? ?Liver biopsy results pending ? ?Sean Rist, MD ?[facility] ?Pager: See Amion ? ? ?

## 2021-11-18 ENCOUNTER — Inpatient Hospital Stay (HOSPITAL_COMMUNITY): Payer: Medicare Other

## 2021-11-18 DIAGNOSIS — I5042 Chronic combined systolic (congestive) and diastolic (congestive) heart failure: Secondary | ICD-10-CM | POA: Diagnosis not present

## 2021-11-18 DIAGNOSIS — Z8611 Personal history of tuberculosis: Secondary | ICD-10-CM | POA: Diagnosis not present

## 2021-11-18 DIAGNOSIS — I679 Cerebrovascular disease, unspecified: Secondary | ICD-10-CM | POA: Diagnosis not present

## 2021-11-18 DIAGNOSIS — J181 Lobar pneumonia, unspecified organism: Secondary | ICD-10-CM | POA: Diagnosis not present

## 2021-11-18 LAB — PROCALCITONIN: Procalcitonin: 0.51 ng/mL

## 2021-11-18 LAB — CBC WITH DIFFERENTIAL/PLATELET
Abs Immature Granulocytes: 0 10*3/uL (ref 0.00–0.07)
Band Neutrophils: 5 %
Basophils Absolute: 0 10*3/uL (ref 0.0–0.1)
Basophils Relative: 0 %
Eosinophils Absolute: 0 10*3/uL (ref 0.0–0.5)
Eosinophils Relative: 0 %
HCT: 27.4 % — ABNORMAL LOW (ref 39.0–52.0)
Hemoglobin: 9.1 g/dL — ABNORMAL LOW (ref 13.0–17.0)
Lymphocytes Relative: 1 %
Lymphs Abs: 0.4 10*3/uL — ABNORMAL LOW (ref 0.7–4.0)
MCH: 29.3 pg (ref 26.0–34.0)
MCHC: 33.2 g/dL (ref 30.0–36.0)
MCV: 88.1 fL (ref 80.0–100.0)
Monocytes Absolute: 4.3 10*3/uL — ABNORMAL HIGH (ref 0.1–1.0)
Monocytes Relative: 11 %
Neutro Abs: 34.4 10*3/uL — ABNORMAL HIGH (ref 1.7–7.7)
Neutrophils Relative %: 83 %
Platelets: 425 10*3/uL — ABNORMAL HIGH (ref 150–400)
RBC: 3.11 MIL/uL — ABNORMAL LOW (ref 4.22–5.81)
RDW: 14.6 % (ref 11.5–15.5)
WBC: 39.1 10*3/uL — ABNORMAL HIGH (ref 4.0–10.5)
nRBC: 0 % (ref 0.0–0.2)

## 2021-11-18 LAB — COMPREHENSIVE METABOLIC PANEL
ALT: 18 U/L (ref 0–44)
AST: 58 U/L — ABNORMAL HIGH (ref 15–41)
Albumin: 1.7 g/dL — ABNORMAL LOW (ref 3.5–5.0)
Alkaline Phosphatase: 146 U/L — ABNORMAL HIGH (ref 38–126)
Anion gap: 7 (ref 5–15)
BUN: 8 mg/dL (ref 8–23)
CO2: 21 mmol/L — ABNORMAL LOW (ref 22–32)
Calcium: 8.7 mg/dL — ABNORMAL LOW (ref 8.9–10.3)
Chloride: 106 mmol/L (ref 98–111)
Creatinine, Ser: 0.66 mg/dL (ref 0.61–1.24)
GFR, Estimated: >60 mL/min (ref >60)
Glucose, Bld: 131 mg/dL — ABNORMAL HIGH (ref 70–99)
Potassium: 3 mmol/L — ABNORMAL LOW (ref 3.5–5.1)
Sodium: 134 mmol/L — ABNORMAL LOW (ref 135–145)
Total Bilirubin: 2 mg/dL — ABNORMAL HIGH (ref 0.3–1.2)
Total Protein: 6 g/dL — ABNORMAL LOW (ref 6.5–8.1)

## 2021-11-18 LAB — ACID FAST SMEAR (AFB, MYCOBACTERIA): Acid Fast Smear: NEGATIVE

## 2021-11-18 LAB — MAGNESIUM: Magnesium: 1.9 mg/dL (ref 1.7–2.4)

## 2021-11-18 MED ORDER — POTASSIUM CHLORIDE CRYS ER 20 MEQ PO TBCR
40.0000 meq | EXTENDED_RELEASE_TABLET | ORAL | Status: AC
  Administered 2021-11-18 (×2): 40 meq via ORAL
  Filled 2021-11-18 (×2): qty 2

## 2021-11-18 NOTE — Plan of Care (Signed)
?  Problem: Clinical Measurements: ?Goal: Ability to maintain a body temperature in the normal range will improve ?Outcome: Progressing ?  ?Problem: Respiratory: ?Goal: Ability to maintain adequate ventilation will improve ?Outcome: Progressing ?Goal: Ability to maintain a clear airway will improve ?Outcome: Progressing ?  ?Problem: Education: ?Goal: Knowledge of General Education information will improve ?Description: Including pain rating scale, medication(s)/side effects and non-pharmacologic comfort measures ?Outcome: Progressing ?  ?Problem: Health Behavior/Discharge Planning: ?Goal: Ability to manage health-related needs will improve ?Outcome: Progressing ?  ?Problem: Clinical Measurements: ?Goal: Ability to maintain clinical measurements within normal limits will improve ?Outcome: Progressing ?Goal: Respiratory complications will improve ?Outcome: Progressing ?Goal: Cardiovascular complication will be avoided ?Outcome: Progressing ?  ?

## 2021-11-18 NOTE — Plan of Care (Signed)
?  Problem: Activity: ?Goal: Ability to tolerate increased activity will improve ?Outcome: Progressing ?  ?Problem: Education: ?Goal: Knowledge of General Education information will improve ?Description: Including pain rating scale, medication(s)/side effects and non-pharmacologic comfort measures ?Outcome: Progressing ?  ?Problem: Clinical Measurements: ?Goal: Ability to maintain clinical measurements within normal limits will improve ?Outcome: Progressing ?  ?Problem: Nutrition: ?Goal: Adequate nutrition will be maintained ?Outcome: Progressing ?  ?Problem: Pain Managment: ?Goal: General experience of comfort will improve ?Outcome: Progressing ?  ?

## 2021-11-18 NOTE — Progress Notes (Addendum)
? ?PROGRESS NOTE ? ? ? ?Sean Farrell  WGN:[id number] DOB: July 03, 1946 DOA: 11/10/2021 ?PCP: Nolene Ebbs, MD ? ? ?Brief Narrative: ?Sean Farrell is a 76 y.o. M with hx stroke, smoking, hx TB in 2010, and chronic heart failure EF 40-45% who presented with cough, SOB and night sweats for maybe a month. ? ?In the ER, CXR showed cavitary PNA, effusion. ? ? ?5/5: Admitted on airborne precautions, chest tube placed, Pulm consulted, CT showing multiple liver lesions, bony lytic lesions, bilateral lung nodules, no obvious primary ?5/6: Obtaining CT abd/pel --> shows pancreatic tail mass; tPA/dornase x1 ?5/7: IR consulted for liver mass biopsy; lytics second dose ?5/10: To IR for liver US and biopsy vs drain placement ? ? ?Assessment and Plan: ? ?Lung consolidation ?Probable pneumonia ?Noted on imaging. Started on empiric antibiotic therapy with Vancomycin, Cefepime and azithromycin and transitioned to Ceftriaxone and doxycycline and finally to Zosyn and doxycycline. Leukocytosis persistent. Associated fevers. Completed doxycycline course. ? ?Fevers ?Persistent with associated leukocytosis. Unsure if this is infectious related. No symptoms per patient. Lung loculated fluid not highly suggestive of infection on fluid analysis/culture. ?-Repeat blood cultures, CBC with differential, procalcitonin, CMP ?-May need to consult ID for recommendations ?-Repeat chest x-tay ? ?Leukocytosis ?Persistent and significantly elevated. In setting of above likely infection vs malignancy. Pathology smear review without evidence of neoplastic process. ? ?Loculated pleural effusion ?Left chest tube placed on 5/5. PCCM managed and removed on 5/11. ? ?History of tuberculosis ?Pleural fluid with negative AFB. Concern for possible active TB. Morning AFB x3 ordered to rule out active TB and negative. Airborne precautions discontinued. ? ?Lytic bone lesions ?In setting of long nodules and liver lesions. Concern for metastatic process. Liver biopsy  performed with pathology pending. ? ?Liver lesions ?CT chest and abdomen/pelvis significant for multiple liver lesions concerning for metastatic disease. Radiology consulted for liver biopsy. Liver biopsy performed on 5/10. Pathology is pending. ? ?Lung nodules ?CT chest from 5/5 significant for multiple small noncalcified pulmonary nodules; possible malignancy. ? ?Sinus tachycardia ?Secondary to fevers. ? ?Hyperbilirubinemia ?Mild. Improving. ? ?Metabolic acidosis ?Mild and now resolved. ? ?Thrombocytosis ?Likely reactive. ? ?Normocytic anemia ?Chronic. Stable. ? ?Cerebrovascular disease ?Not currently on aspirin or statin. ? ?Chronic combined systolic and diastolic heart failure ?LVEF of 40-45% from Transthoracic Echocardiogram on 07/08/17. Stable. ?-Continue metoprolol ? ?Tobacco use ?-Continue nicotine patch ? ?Hyponatremia ?Mild. Stable. ? ?Primary hypertension ?Well controlled. ?-Continue amlodipine and doxycycline ? ? ?DVT prophylaxis: Lovenox ?Code Status:   Code Status: Full Code ?Family Communication: None at bedside ?Disposition Plan: Discharge home pending ability to transition to outpatient antibiotics if needed, improvement of leukocytosis if able, afebrile. Likely not home for 2-3 days. ? ? ?Consultants:  ?PCCM ?Interventional radiology ? ?Procedures:  ?Chest tube insertion (5/5) ? ?Antimicrobials: ?Vancomycin ?Doxycycline ?Cefepime ?Ceftriaxone ?Azithromycin ?Zosyn  ? ? ?Subjective: ?Worsening fever curve. Patient is asymptomatic. No cough, sneezing, rhinorrhea, chest pain, dyspnea, abdominal pain, diarrhea. N skin lesions. No dysuria. ? ?Objective: ?BP 135/60 (BP Location: Right Arm)   Pulse (!) 123   Temp (!) 100.5 ?F (38.1 ?C) (Oral)   Resp (!) 29   Ht '6\' 2"'$  (1.88 m)   Wt 77.4 kg   SpO2 96%   BMI 21.91 kg/m?  ? ?Examination: ? ?General exam: Appears calm and comfortable ?Respiratory system: Clear to auscultation. Respiratory effort normal. ?Cardiovascular system: S1 & S2 heard, RRR. No  murmurs, rubs, gallops or clicks. ?Gastrointestinal system: Abdomen is nondistended, soft and nontender. Normal bowel sounds heard. ?Central  nervous system: Alert and oriented. No focal neurological deficits. ?Musculoskeletal: No edema. No calf tenderness ?Skin: No cyanosis. No rashes ?Psychiatry: Judgement and insight appear normal. Mood & affect appropriate.  ? ? ?Data Reviewed: I have personally reviewed following labs and imaging studies ? ?CBC ?Lab Results  ?Component Value Date  ? WBC 39.1 (H) 11/18/2021  ? RBC 3.11 (L) 11/18/2021  ? HGB 9.1 (L) 11/18/2021  ? HCT 27.4 (L) 11/18/2021  ? MCV 88.1 11/18/2021  ? MCH 29.3 11/18/2021  ? PLT 425 (H) 11/18/2021  ? MCHC 33.2 11/18/2021  ? RDW 14.6 11/18/2021  ? LYMPHSABS 0.4 (L) 11/18/2021  ? MONOABS 4.3 (H) 11/18/2021  ? EOSABS 0.0 11/18/2021  ? BASOSABS 0.0 11/18/2021  ? ? ? ?Last metabolic panel ?Lab Results  ?Component Value Date  ? NA 134 (L) 11/18/2021  ? K 3.0 (L) 11/18/2021  ? CL 106 11/18/2021  ? CO2 21 (L) 11/18/2021  ? BUN 8 11/18/2021  ? CREATININE 0.66 11/18/2021  ? GLUCOSE 131 (H) 11/18/2021  ? GFRNONAA >60 11/18/2021  ? GFRAA >60 07/09/2017  ? CALCIUM 8.7 (L) 11/18/2021  ? PROT 6.0 (L) 11/18/2021  ? ALBUMIN 1.7 (L) 11/18/2021  ? BILITOT 2.0 (H) 11/18/2021  ? ALKPHOS 146 (H) 11/18/2021  ? AST 58 (H) 11/18/2021  ? ALT 18 11/18/2021  ? ANIONGAP 7 11/18/2021  ? ? ?GFR: ?Estimated Creatinine Clearance: 88.7 mL/min (by C-G formula based on SCr of 0.66 mg/dL). ? ?Recent Results (from the past 240 hour(s))  ?Resp Panel by RT-PCR (Flu A&B, Covid) Nasopharyngeal Swab     Status: None  ? Collection Time: 11/10/21 10:02 AM  ? Specimen: Nasopharyngeal Swab; Nasopharyngeal(NP) swabs in vial transport medium  ?Result Value Ref Range Status  ? SARS Coronavirus 2 by RT PCR NEGATIVE NEGATIVE Final  ?  Comment: (NOTE) ?SARS-CoV-2 target nucleic acids are NOT DETECTED. ? ?The SARS-CoV-2 RNA is generally detectable in upper respiratory ?specimens during the acute phase of  infection. The lowest ?concentration of SARS-CoV-2 viral copies this assay can detect is ?138 copies/mL. A negative result does not preclude SARS-Cov-2 ?infection and should not be used as the sole basis for treatment or ?other patient management decisions. A negative result may occur with  ?improper specimen collection/handling, submission of specimen other ?than nasopharyngeal swab, presence of viral mutation(s) within the ?areas targeted by this assay, and inadequate number of viral ?copies(<138 copies/mL). A negative result must be combined with ?clinical observations, patient history, and epidemiological ?information. The expected result is Negative. ? ?Fact Sheet for Patients:  ?EntrepreneurPulse.com.au ? ?Fact Sheet for Healthcare Providers:  ?IncredibleEmployment.be ? ?This test is no t yet approved or cleared by the [place] FDA and  ?has been authorized for detection and/or diagnosis of SARS-CoV-2 by ?FDA under an Emergency Use Authorization (EUA). This EUA will remain  ?in effect (meaning this test can be used) for the duration of the ?COVID-19 declaration under Section 564(b)(1) of the Act, 21 ?U.S.C.section 360bbb-3(b)(1), unless the authorization is terminated  ?or revoked sooner.  ? ? ?  ? Influenza A by PCR NEGATIVE NEGATIVE Final  ? Influenza B by PCR NEGATIVE NEGATIVE Final  ?  Comment: (NOTE) ?The Xpert Xpress SARS-CoV-2/FLU/RSV plus assay is intended as an aid ?in the diagnosis of influenza from Nasopharyngeal swab specimens and ?should not be used as a sole basis for treatment. Nasal washings and ?aspirates are unacceptable for Xpert Xpress SARS-CoV-2/FLU/RSV ?testing. ? ?Fact Sheet for Patients: ?EntrepreneurPulse.com.au ? ?Fact Sheet for Healthcare Providers: ?IncredibleEmployment.be ? ?  This test is not yet approved or cleared by the [place] FDA and ?has been authorized for detection and/or diagnosis of SARS-CoV-2  by ?FDA under an Emergency Use Authorization (EUA). This EUA will remain ?in effect (meaning this test can be used) for the duration of the ?COVID-19 declaration under Section 564(b)(1) of the Act, 21 U.S.C. ?sectio

## 2021-11-19 DIAGNOSIS — J181 Lobar pneumonia, unspecified organism: Secondary | ICD-10-CM | POA: Diagnosis not present

## 2021-11-19 DIAGNOSIS — I5042 Chronic combined systolic (congestive) and diastolic (congestive) heart failure: Secondary | ICD-10-CM | POA: Diagnosis not present

## 2021-11-19 DIAGNOSIS — I679 Cerebrovascular disease, unspecified: Secondary | ICD-10-CM | POA: Diagnosis not present

## 2021-11-19 DIAGNOSIS — Z8611 Personal history of tuberculosis: Secondary | ICD-10-CM | POA: Diagnosis not present

## 2021-11-19 LAB — CBC WITH DIFFERENTIAL/PLATELET
Abs Immature Granulocytes: 0 10*3/uL (ref 0.00–0.07)
Basophils Absolute: 0 10*3/uL (ref 0.0–0.1)
Basophils Relative: 0 %
Eosinophils Absolute: 0.4 10*3/uL (ref 0.0–0.5)
Eosinophils Relative: 1 %
HCT: 28.5 % — ABNORMAL LOW (ref 39.0–52.0)
Hemoglobin: 9.1 g/dL — ABNORMAL LOW (ref 13.0–17.0)
Lymphocytes Relative: 4 %
Lymphs Abs: 1.5 10*3/uL (ref 0.7–4.0)
MCH: 28.9 pg (ref 26.0–34.0)
MCHC: 31.9 g/dL (ref 30.0–36.0)
MCV: 90.5 fL (ref 80.0–100.0)
Monocytes Absolute: 2.3 10*3/uL — ABNORMAL HIGH (ref 0.1–1.0)
Monocytes Relative: 6 %
Neutro Abs: 33.7 10*3/uL — ABNORMAL HIGH (ref 1.7–7.7)
Neutrophils Relative %: 89 %
Platelets: 394 10*3/uL (ref 150–400)
RBC: 3.15 MIL/uL — ABNORMAL LOW (ref 4.22–5.81)
RDW: 14.7 % (ref 11.5–15.5)
WBC: 37.9 10*3/uL — ABNORMAL HIGH (ref 4.0–10.5)
nRBC: 0 % (ref 0.0–0.2)
nRBC: 0 /100 WBC

## 2021-11-19 LAB — POTASSIUM: Potassium: 4.2 mmol/L (ref 3.5–5.1)

## 2021-11-19 LAB — PROCALCITONIN: Procalcitonin: 0.5 ng/mL

## 2021-11-19 NOTE — Plan of Care (Signed)

## 2021-11-19 NOTE — Progress Notes (Signed)
Temp 101.8 orally, bp 127/61, hr 120, st, rr 33.  Tylenol po given.  Paged Dr. Marlowe Sax to make aware.  Will continue to monitor pt.   ?

## 2021-11-19 NOTE — Progress Notes (Signed)
? ?PROGRESS NOTE ? ? ? ?Sean Farrell  ZDG:[id number] DOB: 1946/06/01 DOA: 11/10/2021 ?PCP: Nolene Ebbs, MD ? ? ?Brief Narrative: ?Mr. Sean Farrell is a 76 y.o. M with hx stroke, smoking, hx TB in 2010, and chronic heart failure EF 40-45% who presented with cough, SOB and night sweats for maybe a month. ? ?In the ER, CXR showed cavitary PNA, effusion. ? ? ?5/5: Admitted on airborne precautions, chest tube placed, Pulm consulted, CT showing multiple liver lesions, bony lytic lesions, bilateral lung nodules, no obvious primary ?5/6: Obtaining CT abd/pel --> shows pancreatic tail mass; tPA/dornase x1 ?5/7: IR consulted for liver mass biopsy; lytics second dose ?5/10: To IR for liver US and biopsy vs drain placement ? ? ?Assessment and Plan: ? ?Lung consolidation ?Probable pneumonia ?Noted on imaging. Started on empiric antibiotic therapy with Vancomycin, Cefepime and azithromycin and transitioned to Ceftriaxone and doxycycline and finally to Zosyn and doxycycline. Leukocytosis persistent. Associated fevers. Completed doxycycline course. ? ?Fevers ?Persistent with associated leukocytosis. Unsure if this is infectious related. No symptoms per patient. Lung loculated fluid not highly suggestive of infection on fluid analysis/culture. Repeat blood cultures (5/13) with no growth to date. Procalcitonin elevated but stable. WBC stable. ?-Continue Zosyn; discontinue in AM if blood cultures continue to be negative ?-May need to consult ID for recommendations in AM ? ?Leukocytosis ?Persistent and significantly elevated. In setting of above likely infection vs malignancy. Pathology smear review without evidence of neoplastic process. ? ?Loculated pleural effusion ?Left chest tube placed on 5/5. PCCM managed and removed on 5/11. Persistent on repeat chest x-ray. ? ?History of tuberculosis ?Pleural fluid with negative AFB. Concern for possible active TB. Morning AFB x3 ordered to rule out active TB and negative. Airborne precautions  discontinued. ? ?Lytic bone lesions ?In setting of long nodules and liver lesions. Concern for metastatic process. Liver biopsy performed with pathology pending. ? ?Liver lesions ?CT chest and abdomen/pelvis significant for multiple liver lesions concerning for metastatic disease. Radiology consulted for liver biopsy. Liver biopsy performed on 5/10. Pathology is pending. ? ?Lung nodules ?CT chest from 5/5 significant for multiple small noncalcified pulmonary nodules; possible malignancy. ? ?Sinus tachycardia ?Secondary to fevers. ? ?Hyperbilirubinemia ?Mild. Improving. ? ?Metabolic acidosis ?Mild and now resolved. ? ?Thrombocytosis ?Likely reactive. Improved. ? ?Normocytic anemia ?Chronic. Stable. ? ?Cerebrovascular disease ?Not currently on aspirin or statin. ? ?Chronic combined systolic and diastolic heart failure ?LVEF of 40-45% from Transthoracic Echocardiogram on 07/08/17. Stable. ?-Continue metoprolol ? ?Tobacco use ?-Continue nicotine patch ? ?Hyponatremia ?Mild. Stable. ? ?Primary hypertension ?Well controlled. ?-Continue amlodipine and doxycycline ? ?Hypokalemia ?-Supplementation as needed ? ? ?DVT prophylaxis: Lovenox ?Code Status:   Code Status: Full Code ?Family Communication: None at bedside ?Disposition Plan: Discharge home pending ability to transition to outpatient antibiotics if needed, improvement of leukocytosis if able, afebrile. Likely not home for 1-3 days. ? ? ?Consultants:  ?PCCM ?Interventional radiology ? ?Procedures:  ?Chest tube insertion (5/5) ? ?Antimicrobials: ?Vancomycin ?Doxycycline ?Cefepime ?Ceftriaxone ?Azithromycin ?Zosyn  ? ? ?Subjective: ?Afebrile overnight. Rising temperature this morning. No other issues noted. ? ?Objective: ?BP 131/68 (BP Location: Left Arm)   Pulse (!) 110   Temp 99.3 ?F (37.4 ?C) (Oral)   Resp (!) 28   Ht '6\' 2"'$  (1.88 m)   Wt 78 kg   SpO2 94%   BMI 22.08 kg/m?  ? ?Examination: ? ?General exam: Appears calm and comfortable ?Respiratory system:  Respiratory effort normal. ?Gastrointestinal system: Abdomen is non-distended ? ? ?Data Reviewed: I have personally  reviewed following labs and imaging studies ? ?CBC ?Lab Results  ?Component Value Date  ? WBC 37.9 (H) 11/19/2021  ? RBC 3.15 (L) 11/19/2021  ? HGB 9.1 (L) 11/19/2021  ? HCT 28.5 (L) 11/19/2021  ? MCV 90.5 11/19/2021  ? MCH 28.9 11/19/2021  ? PLT 394 11/19/2021  ? MCHC 31.9 11/19/2021  ? RDW 14.7 11/19/2021  ? LYMPHSABS 1.5 11/19/2021  ? MONOABS 2.3 (H) 11/19/2021  ? EOSABS 0.4 11/19/2021  ? BASOSABS 0.0 11/19/2021  ? ? ? ?Last metabolic panel ?Lab Results  ?Component Value Date  ? NA 134 (L) 11/18/2021  ? K 3.0 (L) 11/18/2021  ? CL 106 11/18/2021  ? CO2 21 (L) 11/18/2021  ? BUN 8 11/18/2021  ? CREATININE 0.66 11/18/2021  ? GLUCOSE 131 (H) 11/18/2021  ? GFRNONAA >60 11/18/2021  ? GFRAA >60 07/09/2017  ? CALCIUM 8.7 (L) 11/18/2021  ? PROT 6.0 (L) 11/18/2021  ? ALBUMIN 1.7 (L) 11/18/2021  ? BILITOT 2.0 (H) 11/18/2021  ? ALKPHOS 146 (H) 11/18/2021  ? AST 58 (H) 11/18/2021  ? ALT 18 11/18/2021  ? ANIONGAP 7 11/18/2021  ? ? ?GFR: ?Estimated Creatinine Clearance: 89.4 mL/min (by C-G formula based on SCr of 0.66 mg/dL). ? ?Recent Results (from the past 240 hour(s))  ?Resp Panel by RT-PCR (Flu A&B, Covid) Nasopharyngeal Swab     Status: None  ? Collection Time: 11/10/21 10:02 AM  ? Specimen: Nasopharyngeal Swab; Nasopharyngeal(NP) swabs in vial transport medium  ?Result Value Ref Range Status  ? SARS Coronavirus 2 by RT PCR NEGATIVE NEGATIVE Final  ?  Comment: (NOTE) ?SARS-CoV-2 target nucleic acids are NOT DETECTED. ? ?The SARS-CoV-2 RNA is generally detectable in upper respiratory ?specimens during the acute phase of infection. The lowest ?concentration of SARS-CoV-2 viral copies this assay can detect is ?138 copies/mL. A negative result does not preclude SARS-Cov-2 ?infection and should not be used as the sole basis for treatment or ?other patient management decisions. A negative result may occur with   ?improper specimen collection/handling, submission of specimen other ?than nasopharyngeal swab, presence of viral mutation(s) within the ?areas targeted by this assay, and inadequate number of viral ?copies(<138 copies/mL). A negative result must be combined with ?clinical observations, patient history, and epidemiological ?information. The expected result is Negative. ? ?Fact Sheet for Patients:  ?EntrepreneurPulse.com.au ? ?Fact Sheet for Healthcare Providers:  ?IncredibleEmployment.be ? ?This test is no t yet approved or cleared by the [place] FDA and  ?has been authorized for detection and/or diagnosis of SARS-CoV-2 by ?FDA under an Emergency Use Authorization (EUA). This EUA will remain  ?in effect (meaning this test can be used) for the duration of the ?COVID-19 declaration under Section 564(b)(1) of the Act, 21 ?U.S.C.section 360bbb-3(b)(1), unless the authorization is terminated  ?or revoked sooner.  ? ? ?  ? Influenza A by PCR NEGATIVE NEGATIVE Final  ? Influenza B by PCR NEGATIVE NEGATIVE Final  ?  Comment: (NOTE) ?The Xpert Xpress SARS-CoV-2/FLU/RSV plus assay is intended as an aid ?in the diagnosis of influenza from Nasopharyngeal swab specimens and ?should not be used as a sole basis for treatment. Nasal washings and ?aspirates are unacceptable for Xpert Xpress SARS-CoV-2/FLU/RSV ?testing. ? ?Fact Sheet for Patients: ?EntrepreneurPulse.com.au ? ?Fact Sheet for Healthcare Providers: ?IncredibleEmployment.be ? ?This test is not yet approved or cleared by the [place] FDA and ?has been authorized for detection and/or diagnosis of SARS-CoV-2 by ?FDA under an Emergency Use Authorization (EUA). This EUA will remain ?in effect (meaning this  test can be used) for the duration of the ?COVID-19 declaration under Section 564(b)(1) of the Act, 21 U.S.C. ?section 360bbb-3(b)(1), unless the authorization is terminated  or ?revoked. ? ?Performed at [facility], [place] [address], [place], [place] ?[address] ?  ?Respiratory (~20 pathogens) panel by PCR     Status: None  ? Collection Time: 11/10/21 10:02 AM  ? Specimen: Nasopharyngeal Swab; Respiratory

## 2021-11-19 NOTE — Progress Notes (Signed)
Mobility Specialist Progress Note  ? ? 11/19/21 0941  ?Mobility  ?Activity Ambulated with assistance to bathroom  ?Level of Assistance Minimal assist, patient does 75% or more  ?Assistive Device Front wheel walker  ?Distance Ambulated (ft) 24 ft ?(12+12)  ?Activity Response Tolerated fair  ?$Mobility charge 1 Mobility  ? ?During Mobility: 145 HR ?Post-Mobility: 121 HR ? ?Pt received in bed and agreeable. Had BM in bed but finished in BR. HR into 130s w/ standing and 140s while moving. Returned to chair with call bell in reach and chair alarm on. RN notified.  ? ?Sean Farrell ?Mobility Specialist  ?Primary: 5N M.S. Phone: [phone] ?Secondary: 6N M.S. Phone: [phone] ?  ?

## 2021-11-20 DIAGNOSIS — J181 Lobar pneumonia, unspecified organism: Secondary | ICD-10-CM | POA: Diagnosis not present

## 2021-11-20 DIAGNOSIS — I5042 Chronic combined systolic (congestive) and diastolic (congestive) heart failure: Secondary | ICD-10-CM | POA: Diagnosis not present

## 2021-11-20 DIAGNOSIS — I679 Cerebrovascular disease, unspecified: Secondary | ICD-10-CM | POA: Diagnosis not present

## 2021-11-20 DIAGNOSIS — Z8611 Personal history of tuberculosis: Secondary | ICD-10-CM | POA: Diagnosis not present

## 2021-11-20 LAB — CBC WITH DIFFERENTIAL/PLATELET
Abs Immature Granulocytes: 0 10*3/uL (ref 0.00–0.07)
Basophils Absolute: 0 10*3/uL (ref 0.0–0.1)
Basophils Relative: 0 %
Eosinophils Absolute: 0.7 10*3/uL — ABNORMAL HIGH (ref 0.0–0.5)
Eosinophils Relative: 2 %
HCT: 27.3 % — ABNORMAL LOW (ref 39.0–52.0)
Hemoglobin: 8.9 g/dL — ABNORMAL LOW (ref 13.0–17.0)
Lymphocytes Relative: 2 %
Lymphs Abs: 0.7 10*3/uL (ref 0.7–4.0)
MCH: 29.2 pg (ref 26.0–34.0)
MCHC: 32.6 g/dL (ref 30.0–36.0)
MCV: 89.5 fL (ref 80.0–100.0)
Monocytes Absolute: 1.4 10*3/uL — ABNORMAL HIGH (ref 0.1–1.0)
Monocytes Relative: 4 %
Neutro Abs: 32.3 10*3/uL — ABNORMAL HIGH (ref 1.7–7.7)
Neutrophils Relative %: 92 %
Platelets: 403 10*3/uL — ABNORMAL HIGH (ref 150–400)
RBC: 3.05 MIL/uL — ABNORMAL LOW (ref 4.22–5.81)
RDW: 14.9 % (ref 11.5–15.5)
WBC: 35.1 10*3/uL — ABNORMAL HIGH (ref 4.0–10.5)
nRBC: 0 % (ref 0.0–0.2)
nRBC: 0 /100 WBC

## 2021-11-20 LAB — BASIC METABOLIC PANEL
Anion gap: 7 (ref 5–15)
BUN: 8 mg/dL (ref 8–23)
CO2: 20 mmol/L — ABNORMAL LOW (ref 22–32)
Calcium: 8.8 mg/dL — ABNORMAL LOW (ref 8.9–10.3)
Chloride: 107 mmol/L (ref 98–111)
Creatinine, Ser: 0.8 mg/dL (ref 0.61–1.24)
GFR, Estimated: >60 mL/min (ref >60)
Glucose, Bld: 118 mg/dL — ABNORMAL HIGH (ref 70–99)
Potassium: 3.6 mmol/L (ref 3.5–5.1)
Sodium: 134 mmol/L — ABNORMAL LOW (ref 135–145)

## 2021-11-20 LAB — AEROBIC/ANAEROBIC CULTURE W GRAM STAIN (SURGICAL/DEEP WOUND)
Culture: NO GROWTH
Gram Stain: NONE SEEN

## 2021-11-20 LAB — PROCALCITONIN: Procalcitonin: 0.51 ng/mL

## 2021-11-20 NOTE — Progress Notes (Signed)
Physical Therapy Treatment ?Patient Details ?Name: Sean Farrell ?MRN: [id number] ?DOB: Aug 10, 1945 ?Today's Date: 11/20/2021 ? ? ?History of Present Illness 76 yo male presenting to ED on 5/5 with SOB and back pain. Chest CT showing concern for large cavitary lesion in the right upper lobe abutting the mediastinum. S/p L pigtail chest tube placed on 5/5. Liver biopsy 5/10. Onset of Afib during PT tx session 5/11, RN/MD notified. PMH including HTN, polysubstance abuse, mycobacterium tuberculosis  (2010), CHF, Hep C, and L ankle sx. ? ?  ?PT Comments  ? ? Pt making steady progress. Questioned patient about managing at home. Pt has aide 2 hrs/day and when asked said he had someone else assist if needed but did not give specifics. Pt declined any suggestion of SNF for rehab. Will continue to work toward return home.   ?Recommendations for follow up therapy are one component of a multi-disciplinary discharge planning process, led by the attending physician.  Recommendations may be updated based on patient status, additional functional criteria and insurance authorization. ? ?Follow Up Recommendations ? Home health PT ?  ?  ?Assistance Recommended at Discharge Intermittent Supervision/Assistance  ?Patient can return home with the following A little help with walking and/or transfers;A little help with bathing/dressing/bathroom;Assistance with cooking/housework;Direct supervision/assist for medications management;Direct supervision/assist for financial management;Assist for transportation ?  ?Equipment Recommendations ? Rolling walker (2 wheels)  ?  ?Recommendations for Other Services   ? ? ?  ?Precautions / Restrictions Precautions ?Precautions: Fall;Other (comment) ?Precaution Comments: tachycardia ?Restrictions ?Weight Bearing Restrictions: No  ?  ? ?Mobility ? Bed Mobility ?Overal bed mobility: Needs Assistance ?Bed Mobility: Supine to Sit, Sit to Supine ?  ?  ?Supine to sit: HOB elevated, Supervision ?Sit to supine:  Min assist ?  ?General bed mobility comments: Assist to bring legs back up into the bed returning to supine ?  ? ?Transfers ?Overall transfer level: Needs assistance ?Equipment used: None ?Transfers: Sit to/from Stand, Bed to chair/wheelchair/BSC ?Sit to Stand: Min guard ?  ?Step pivot transfers: Min guard ?  ?  ?  ?General transfer comment: Assist for safety and lines. Pt able to step pivot recliner to bed without assistive device ?  ? ?Ambulation/Gait ?Ambulation/Gait assistance: Min guard ?Gait Distance (Feet): 110 Feet ?Assistive device: Rolling walker (2 wheels) ?Gait Pattern/deviations: Trunk flexed, Narrow base of support, Step-through pattern, Decreased stride length ?Gait velocity: decreased ?Gait velocity interpretation: 1.31 - 2.62 ft/sec, indicative of limited community ambulator ?  ?General Gait Details: Assist for safety and lines. Pt with HR to 140's ? ? ?Stairs ?  ?  ?  ?  ?  ? ? ?Wheelchair Mobility ?  ? ?Modified Rankin (Stroke Patients Only) ?  ? ? ?  ?Balance Overall balance assessment: Needs assistance ?Sitting-balance support: No upper extremity supported, Feet supported ?Sitting balance-Leahy Scale: Good ?  ?  ?Standing balance support: During functional activity, No upper extremity supported ?Standing balance-Leahy Scale: Fair ?  ?  ?  ?  ?  ?  ?  ?  ?  ?  ?  ?  ?  ? ?  ?Cognition Arousal/Alertness: Awake/alert ?Behavior During Therapy: Flat affect ?Overall Cognitive Status: Within Functional Limits for tasks assessed ?  ?  ?  ?  ?  ?  ?  ?  ?  ?  ?  ?  ?  ?  ?  ?  ?General Comments: Pt with minimal conversation per pt preference ?  ?  ? ?  ?Exercises   ? ?  ?  General Comments General comments (skin integrity, edema, etc.): HR to 140's with amb ?  ?  ? ?Pertinent Vitals/Pain Pain Assessment ?Pain Assessment: No/denies pain  ? ? ?Home Living   ?  ?  ?  ?  ?  ?  ?  ?  ?  ?   ?  ?Prior Function    ?  ?  ?   ? ?PT Goals (current goals can now be found in the care plan section) Acute Rehab PT  Goals ?Patient Stated Goal: did not state ?Progress towards PT goals: Progressing toward goals ? ?  ?Frequency ? ? ? Min 3X/week ? ? ? ?  ?PT Plan Current plan remains appropriate  ? ? ?Co-evaluation   ?  ?  ?  ?  ? ?  ?AM-PAC PT "6 Clicks" Mobility   ?Outcome Measure ? Help needed turning from your back to your side while in a flat bed without using bedrails?: A Little ?Help needed moving from lying on your back to sitting on the side of a flat bed without using bedrails?: A Little ?Help needed moving to and from a bed to a chair (including a wheelchair)?: A Little ?Help needed standing up from a chair using your arms (e.g., wheelchair or bedside chair)?: A Little ?Help needed to walk in hospital room?: A Little ?Help needed climbing 3-5 steps with a railing? : A Lot ?6 Click Score: 17 ? ?  ?End of Session   ?Activity Tolerance: Patient tolerated treatment well ?Patient left: in bed;with call bell/phone within reach;with bed alarm set ?Nurse Communication: Mobility status ?PT Visit Diagnosis: Unsteadiness on feet (R26.81);Difficulty in walking, not elsewhere classified (R26.2) ?  ? ? ?Time: 9390-3009 ?PT Time Calculation (min) (ACUTE ONLY): 31 min ? ?Charges:  $Gait Training: 23-37 mins          ?          ? ?[facility] PT ?Acute Rehabilitation Services ?Office [phone] ? ? ? ?Shary Decamp [facility] ?11/20/2021, 5:14 PM ? ?

## 2021-11-20 NOTE — Progress Notes (Signed)
? ?PROGRESS NOTE ? ? ? ?Sean Farrell  BJY:[id number] DOB: [birth year] DOA: 11/10/2021 ?PCP: Nolene Ebbs, MD ? ? ?Brief Narrative: ?Sean Farrell is a 76 y.o. M with hx stroke, smoking, hx TB in 2010, and chronic heart failure EF 40-45% who presented with cough, SOB and night sweats for maybe a month. ? ?In the ER, CXR showed cavitary PNA, effusion. ? ? ?5/5: Admitted on airborne precautions, chest tube placed, Pulm consulted, CT showing multiple liver lesions, bony lytic lesions, bilateral lung nodules, no obvious primary ?5/6: Obtaining CT abd/pel --> shows pancreatic tail mass; tPA/dornase x1 ?5/7: IR consulted for liver mass biopsy; lytics second dose ?5/10: To IR for liver US and biopsy vs drain placement ? ? ?Assessment and Plan: ? ?Lung consolidation ?Probable pneumonia ?Noted on imaging. Started on empiric antibiotic therapy with Vancomycin, Cefepime and azithromycin and transitioned to Ceftriaxone and doxycycline and finally to Zosyn and doxycycline. Leukocytosis persistent. Associated fevers. Completed doxycycline course. ? ?Fevers ?Persistent with associated leukocytosis. Unsure if this is infectious related. No symptoms per patient. Lung loculated fluid not highly suggestive of infection on fluid analysis/culture. Repeat blood cultures (5/13) with no growth to date. Procalcitonin elevated but stable. WBC stable. Continued fevers. ?-Discontinue Zosyn ?-Consult ID ? ?Leukocytosis ?Persistent and significantly elevated. In setting of above likely infection vs malignancy. Pathology smear review without evidence of neoplastic process. ? ?Loculated pleural effusion ?Left chest tube placed on 5/5. PCCM managed and removed on 5/11. Persistent on repeat chest x-ray. ? ?History of tuberculosis ?Pleural fluid with negative AFB. Concern for possible active TB. Morning AFB x3 ordered to rule out active TB and negative. Airborne precautions discontinued. ? ?Lytic bone lesions ?In setting of long nodules and liver  lesions. Concern for metastatic process. Liver biopsy performed with pathology pending. ? ?Liver lesions ?CT chest and abdomen/pelvis significant for multiple liver lesions concerning for metastatic disease. Radiology consulted for liver biopsy. Liver biopsy performed on 5/10. Pathology is pending. ? ?Lung nodules ?CT chest from 5/5 significant for multiple small noncalcified pulmonary nodules; possible malignancy. ? ?Sinus tachycardia ?Secondary to fevers. ? ?Hyperbilirubinemia ?Mild. Improving. ? ?Metabolic acidosis ?Mild and now resolved. ? ?Thrombocytosis ?Likely reactive. Improved. ? ?Normocytic anemia ?Chronic. Stable. ? ?Cerebrovascular disease ?Not currently on aspirin or statin. ? ?Chronic combined systolic and diastolic heart failure ?LVEF of 40-45% from Transthoracic Echocardiogram on 07/08/17. Stable. ?-Continue metoprolol ? ?Tobacco use ?-Continue nicotine patch ? ?Hyponatremia ?Mild. Stable. ? ?Primary hypertension ?Well controlled. ?-Continue amlodipine and doxycycline ? ?Hypokalemia ?-Supplementation as needed ? ? ?DVT prophylaxis: Lovenox ?Code Status:   Code Status: Full Code ?Family Communication: None at bedside ?Disposition Plan: Discharge home pending ability to transition to outpatient antibiotics if needed, improvement of leukocytosis if able, afebrile. Likely not home for 2-3 days. ? ? ?Consultants:  ?PCCM ?Interventional radiology ?Infectious disease ? ?Procedures:  ?Chest tube insertion (5/5) ? ?Antimicrobials: ?Vancomycin ?Doxycycline ?Cefepime ?Ceftriaxone ?Azithromycin ?Zosyn  ? ? ?Subjective: ?Tmax of 101.8 ?F. Patient is still asymptomatic. ? ?Objective: ?BP 116/65 (BP Location: Left Arm)   Pulse (!) 108   Temp 99.3 ?F (37.4 ?C) (Oral)   Resp (!) 23   Ht '6\' 2"'$  (1.88 m)   Wt 76.4 kg   SpO2 95%   BMI 21.63 kg/m?  ? ?Examination: ? ?General exam: Appears calm and comfortable ?Respiratory system: Respiratory effort normal. ?Cardiovascular system: S1 & S2 heard, RRR. No  murmurs ?Gastrointestinal system: Abdomen is nondistended, soft and nontender. Normal bowel sounds heard. ?Central nervous system: Alert and oriented.  No focal neurological deficits. ?Musculoskeletal: No edema. No calf tenderness ?Skin: No cyanosis. No rashes ?Psychiatry: Judgement and insight appear normal. Mood & affect appropriate.  ? ? ?Data Reviewed: I have personally reviewed following labs and imaging studies ? ?CBC ?Lab Results  ?Component Value Date  ? WBC 35.1 (H) 11/20/2021  ? RBC 3.05 (L) 11/20/2021  ? HGB 8.9 (L) 11/20/2021  ? HCT 27.3 (L) 11/20/2021  ? MCV 89.5 11/20/2021  ? MCH 29.2 11/20/2021  ? PLT 403 (H) 11/20/2021  ? MCHC 32.6 11/20/2021  ? RDW 14.9 11/20/2021  ? LYMPHSABS 0.7 11/20/2021  ? MONOABS 1.4 (H) 11/20/2021  ? EOSABS 0.7 (H) 11/20/2021  ? BASOSABS 0.0 11/20/2021  ? ? ? ?Last metabolic panel ?Lab Results  ?Component Value Date  ? NA 134 (L) 11/20/2021  ? K 3.6 11/20/2021  ? CL 107 11/20/2021  ? CO2 20 (L) 11/20/2021  ? BUN 8 11/20/2021  ? CREATININE 0.80 11/20/2021  ? GLUCOSE 118 (H) 11/20/2021  ? GFRNONAA >60 11/20/2021  ? GFRAA >60 07/09/2017  ? CALCIUM 8.8 (L) 11/20/2021  ? PROT 6.0 (L) 11/18/2021  ? ALBUMIN 1.7 (L) 11/18/2021  ? BILITOT 2.0 (H) 11/18/2021  ? ALKPHOS 146 (H) 11/18/2021  ? AST 58 (H) 11/18/2021  ? ALT 18 11/18/2021  ? ANIONGAP 7 11/20/2021  ? ? ?GFR: ?Estimated Creatinine Clearance: 87.5 mL/min (by C-G formula based on SCr of 0.8 mg/dL). ? ?Recent Results (from the past 240 hour(s))  ?Resp Panel by RT-PCR (Flu A&B, Covid) Nasopharyngeal Swab     Status: None  ? Collection Time: 11/10/21 10:02 AM  ? Specimen: Nasopharyngeal Swab; Nasopharyngeal(NP) swabs in vial transport medium  ?Result Value Ref Range Status  ? SARS Coronavirus 2 by RT PCR NEGATIVE NEGATIVE Final  ?  Comment: (NOTE) ?SARS-CoV-2 target nucleic acids are NOT DETECTED. ? ?The SARS-CoV-2 RNA is generally detectable in upper respiratory ?specimens during the acute phase of infection. The  lowest ?concentration of SARS-CoV-2 viral copies this assay can detect is ?138 copies/mL. A negative result does not preclude SARS-Cov-2 ?infection and should not be used as the sole basis for treatment or ?other patient management decisions. A negative result may occur with  ?improper specimen collection/handling, submission of specimen other ?than nasopharyngeal swab, presence of viral mutation(s) within the ?areas targeted by this assay, and inadequate number of viral ?copies(<138 copies/mL). A negative result must be combined with ?clinical observations, patient history, and epidemiological ?information. The expected result is Negative. ? ?Fact Sheet for Patients:  ?EntrepreneurPulse.com.au ? ?Fact Sheet for Healthcare Providers:  ?IncredibleEmployment.be ? ?This test is no t yet approved or cleared by the [place] FDA and  ?has been authorized for detection and/or diagnosis of SARS-CoV-2 by ?FDA under an Emergency Use Authorization (EUA). This EUA will remain  ?in effect (meaning this test can be used) for the duration of the ?COVID-19 declaration under Section 564(b)(1) of the Act, 21 ?U.S.C.section 360bbb-3(b)(1), unless the authorization is terminated  ?or revoked sooner.  ? ? ?  ? Influenza A by PCR NEGATIVE NEGATIVE Final  ? Influenza B by PCR NEGATIVE NEGATIVE Final  ?  Comment: (NOTE) ?The Xpert Xpress SARS-CoV-2/FLU/RSV plus assay is intended as an aid ?in the diagnosis of influenza from Nasopharyngeal swab specimens and ?should not be used as a sole basis for treatment. Nasal washings and ?aspirates are unacceptable for Xpert Xpress SARS-CoV-2/FLU/RSV ?testing. ? ?Fact Sheet for Patients: ?EntrepreneurPulse.com.au ? ?Fact Sheet for Healthcare Providers: ?IncredibleEmployment.be ? ?This test is not yet  approved or cleared by the [place] and ?has been authorized for detection and/or diagnosis of SARS-CoV-2 by ?FDA under  an Emergency Use Authorization (EUA). This EUA will remain ?in effect (meaning this test can be used) for the duration of the ?COVID-19 declaration under Section 564(b)(1) of the Act, 21 U.S.C. ?section 360bbb-3(b)(1), unless the authorizatio

## 2021-11-20 NOTE — Consult Note (Addendum)
? ? ?[facility]  ?                                                                                     ? ?Patient Identification: ?Patient Name: Sean Farrell MRN: [id number] [place] Date: 11/10/2021  8:21 AM ?Today's Date: 11/20/2021 ?Reason for consult:  fevers/leukocytosis ?Requesting provider: Gardenia Phlegm  ? ?Principal Problem: ?  Lung consolidation ([facility]) ?Active Problems: ?  Hypertension ?  Hyponatremia ?  Tobacco abuse ?  Chronic combined systolic (congestive) and diastolic (congestive) heart failure (H[facility]) ?  Loculated pleural effusion ?  Liver lesion  ?  Cerebrovascular disease ?  History of tuberculosis ?  Normocytic anemia ?  Metabolic acidosis ?  Hyperbilirubinemia ?  Sinus tachycardia ?  Lytic bone lesions on xray ?  Protein-calorie malnutrition, severe ? ? ?Antibiotics:  ?Vancomycin 5/5-5/6 ?Doxycycline 5/6-5/10 ?Cefepime 5/5, ceftriaxone 5/6, Zosyn 5/7-c ? ?Lines/Hardware: PIV ? ?Assessment ?# Bilateral Pleural effusion ( Lt sided loculated>>Rt ) s/p left  chest tube 5/5 ( Cx no growth, AFB and Fungal smear negative , path negative for malignant cells,  s/p fibrinolytics) ? ?# H/o TB , s/p completion of tx viaa DOTS ( 09/19/18 until 04/01/09 per Tammy Faucette at [facility]) ?- AFB sputum smear * 3 negative, Cx is pending  ?- Exudative with possibility of malignant pleural effusion ? 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 ?# Fevers  ?# Leukocytosis ?- continues to be intermittently febrile on broad spectrum antibiotics.  ?- Possible leukemoid reaction in the setting of malignancy ?- Blood cx 5/13 no growth in 2 days  ?- No symptoms except chronic productive cough with no recent change  ?- Mild elevation in AST and bilirubin ? ?# Protein Energy Malnutrition ? ?# Hepatitis C ?# Smoking  ? ?Recommendations  ?Off  antibiotics since 5/15 since no clear infective cause. Continue to monitor off antibiotics to see if any changes noted  ?Fu blood cultures  ?Oncology consult ?Check HCV RNA ?Monitor fever, WBC  ? ?Rest of the management as per the primary team. Please call with questions or concerns.  ?Thank you for the consult ? ?Rosiland Oz, MD ?Infectious Disease Physician ?[facility] ?[address] [place] [address] ?[place], [place] [address] ?Phone: [phone]  Fax: [phone] ? ?__________________________________________________________________________________________________________ ?HPI and Hospital Course: ?76 year old with PMH of tuberculosis (status post completion of DOTS, started 09/18/2008 and completed on 04/01/2009, per Tammy Faucette at the [facility]), HTN, smoker, hepatitis C and marijuana use who presented to the ED on 5/5 with shortness of breath, productive cough, night sweats and back pain for 2 weeks.  Patient is a poor historian and most of the history obtained from chart review. ? ?At ED afebrile ?Labs remarkable for WBC 36.7, Hb 10.6, platelets 538 ?Chest x-ray 5/5 progressed abnormal left lung base since 2018 radiograph, felt at least in part related to pleural effusion with loculation.  Mild right pleural effusion. ? ?CT chest 5/5 findings consistent with metastatic disease including multiple liver lesions and multiple bony lytic lesions in thoracic  spine and bilateral ribs.  No definite primary lung malignancy.  Multiple small solid noncalcified pulmonary nodules in both lungs measuring up to 5 mm are indeterminate but could represent metastatic disease as well.  Moderate loculated left pleural effusion with partial collapse of the lingula and left lower lobe.  Small right pleural effusion.  Sequelae of prior granulomatous disease in the right lung. ? ?S/p left pigtail chest tube 5/5. 5/11 -chest tube removed. CX no growth  ? ? ?CT abdomen pelvis 5/6 ?Small  bilateral loculated pleural effusion with left greater than right nodularity compatible with pleural metastatic disease.  Ill-defined lesion of the tail of the pancreas possibly due to primary vitamin C or additional site of metastatic disease.  Enlarged periportal lymph node. Lytic lesion of the lateral left ninth rib with soft tissue component and S1 lytic lesion compatible with osseous metastatic disease ? ?Status post ultrasound-guided liver biopsy on 5/10. No growth in cultures, Pathology with poorly differentiated adenocarcinoma.  ? ?ROS: Productive cough with clear sputum, poor appetite.  Denies nausea, vomiting or diarrhea.  Denies shortness of breath and chest pain.  Denies GU symptoms, joint pain or rashes.  ? ?Past Medical History:  ?Diagnosis Date  ? Hypertension   ? Marijuana abuse   ? Tobacco abuse   ? Tuberculosis 2010  ? ?Past Surgical History:  ?Procedure Laterality Date  ? Left ankle surgery    ? ? ?Scheduled Meds: ? docusate sodium  100 mg Oral BID  ? enoxaparin (LOVENOX) injection  40 mg Subcutaneous Q24H  ? feeding supplement  237 mL Oral TID BM  ? multivitamin with minerals  1 tablet Oral Daily  ? nicotine  14 mg Transdermal Daily  ? sodium chloride flush  3 mL Intravenous Q12H  ? umeclidinium bromide  1 puff Inhalation Daily  ? zolpidem  5 mg Oral QHS  ? ?Continuous Infusions: ?PRN Meds:.acetaminophen **OR** acetaminophen, albuterol, bisacodyl, guaiFENesin, hydrALAZINE, ondansetron **OR** ondansetron (ZOFRAN) IV, polyethylene glycol, tiZANidine ? ?No Known Allergies ? ?Social History  ? ?Socioeconomic History  ? Marital status: Divorced  ?  Spouse name: Not on file  ? Number of children: Not on file  ? Years of education: Not on file  ? Highest education level: Not on file  ?Occupational History  ? Occupation: retired  ?Tobacco Use  ? Smoking status: Every Day  ?  Packs/day: 1.00  ?  Years: 54.00  ?  Pack years: 54.00  ?  Types: Cigarettes  ? Smokeless tobacco: Current  ?Substance and Sexual  Activity  ? Alcohol use: No  ? Drug use: Yes  ?  Types: Marijuana  ? Sexual activity: Not on file  ?Other Topics Concern  ? Not on file  ?Social History Narrative  ? Not on file  ? ?Social Determinants of Health  ? ?Financial Resource Strain: Not on file  ?Food Insecurity: Not on file  ?Transportation Needs: Not on file  ?Physical Activity: Not on file  ?Stress: Not on file  ?Social Connections: Not on file  ?Intimate Partner Violence: Not on file  ? ?Family History  ?Problem Relation Age of Onset  ? Breast cancer Mother   ? Alcoholism Father   ? Breast cancer Sister   ? ?Vitals ?BP 116/65 (BP Location: Left Arm)   Pulse (!) 108   Temp 99.3 ?F (37.4 ?C) (Oral)   Resp (!) 23   Ht '6\' 2"'$  (1.88 m)   Wt 76.4 kg   SpO2 95%   BMI 21.63 kg/m?  ? ?  Physical Exam ?Constitutional: Malnourished with bilateral temporal wasting ?   Comments:  ? ?Cardiovascular:  ?   Rate and Rhythm: Normal rate and regular rhythm.  ?   Heart sounds: ? ?Pulmonary:  ?   Effort: Pulmonary effort is normal on room air ?   Comments: Bilateral equal air entry ? ?Abdominal:  ?   Palpations: Abdomen is soft.  ?   Tenderness: Nondistended and nontender ? ?Musculoskeletal:     ?   General: No swelling or tenderness.  ? ?Skin: ?   Comments: No lesions or rashes ? ?Neurological:  ?   General: Grossly nonfocal, awake alert and oriented ? ?Psychiatric:     ?   Mood and Affect: Mood normal.  ? ? ?Pertinent Microbiology ?Results for orders placed or performed during the hospital encounter of 11/10/21  ?Resp Panel by RT-PCR (Flu A&B, Covid) Nasopharyngeal Swab     Status: None  ? Collection Time: 11/10/21 10:02 AM  ? Specimen: Nasopharyngeal Swab; Nasopharyngeal(NP) swabs in vial transport medium  ?Result Value Ref Range Status  ? SARS Coronavirus 2 by RT PCR NEGATIVE NEGATIVE Final  ?  Comment: (NOTE) ?SARS-CoV-2 target nucleic acids are NOT DETECTED. ? ?The SARS-CoV-2 RNA is generally detectable in upper respiratory ?specimens during the acute phase of  infection. The lowest ?concentration of SARS-CoV-2 viral copies this assay can detect is ?138 copies/mL. A negative result does not preclude SARS-Cov-2 ?infection and should not be used as the sole basis for treatment or

## 2021-11-21 ENCOUNTER — Encounter (HOSPITAL_COMMUNITY): Payer: Self-pay | Admitting: Internal Medicine

## 2021-11-21 DIAGNOSIS — A403 Sepsis due to Streptococcus pneumoniae: Secondary | ICD-10-CM

## 2021-11-21 DIAGNOSIS — Z8673 Personal history of transient ischemic attack (TIA), and cerebral infarction without residual deficits: Secondary | ICD-10-CM

## 2021-11-21 DIAGNOSIS — C7951 Secondary malignant neoplasm of bone: Secondary | ICD-10-CM | POA: Diagnosis not present

## 2021-11-21 DIAGNOSIS — D72829 Elevated white blood cell count, unspecified: Secondary | ICD-10-CM

## 2021-11-21 DIAGNOSIS — I5042 Chronic combined systolic (congestive) and diastolic (congestive) heart failure: Secondary | ICD-10-CM | POA: Diagnosis not present

## 2021-11-21 DIAGNOSIS — I951 Orthostatic hypotension: Secondary | ICD-10-CM

## 2021-11-21 DIAGNOSIS — C801 Malignant (primary) neoplasm, unspecified: Secondary | ICD-10-CM | POA: Diagnosis not present

## 2021-11-21 DIAGNOSIS — I679 Cerebrovascular disease, unspecified: Secondary | ICD-10-CM | POA: Diagnosis not present

## 2021-11-21 DIAGNOSIS — Z8611 Personal history of tuberculosis: Secondary | ICD-10-CM | POA: Diagnosis not present

## 2021-11-21 DIAGNOSIS — J181 Lobar pneumonia, unspecified organism: Secondary | ICD-10-CM | POA: Diagnosis not present

## 2021-11-21 DIAGNOSIS — C787 Secondary malignant neoplasm of liver and intrahepatic bile duct: Secondary | ICD-10-CM

## 2021-11-21 DIAGNOSIS — E440 Moderate protein-calorie malnutrition: Secondary | ICD-10-CM

## 2021-11-21 DIAGNOSIS — R000 Tachycardia, unspecified: Secondary | ICD-10-CM

## 2021-11-21 LAB — CBC WITH DIFFERENTIAL/PLATELET
Abs Immature Granulocytes: 0.76 10*3/uL — ABNORMAL HIGH (ref 0.00–0.07)
Basophils Absolute: 0.2 10*3/uL — ABNORMAL HIGH (ref 0.0–0.1)
Basophils Relative: 0 %
Eosinophils Absolute: 0.1 10*3/uL (ref 0.0–0.5)
Eosinophils Relative: 0 %
HCT: 29.2 % — ABNORMAL LOW (ref 39.0–52.0)
Hemoglobin: 9.3 g/dL — ABNORMAL LOW (ref 13.0–17.0)
Immature Granulocytes: 2 %
Lymphocytes Relative: 5 %
Lymphs Abs: 2 10*3/uL (ref 0.7–4.0)
MCH: 29.6 pg (ref 26.0–34.0)
MCHC: 31.8 g/dL (ref 30.0–36.0)
MCV: 93 fL (ref 80.0–100.0)
Monocytes Absolute: 2.9 10*3/uL — ABNORMAL HIGH (ref 0.1–1.0)
Monocytes Relative: 7 %
Neutro Abs: 34 10*3/uL — ABNORMAL HIGH (ref 1.7–7.7)
Neutrophils Relative %: 86 %
Platelets: 419 10*3/uL — ABNORMAL HIGH (ref 150–400)
RBC: 3.14 MIL/uL — ABNORMAL LOW (ref 4.22–5.81)
RDW: 15.3 % (ref 11.5–15.5)
WBC: 40 10*3/uL — ABNORMAL HIGH (ref 4.0–10.5)
nRBC: 0 % (ref 0.0–0.2)

## 2021-11-21 LAB — SEDIMENTATION RATE: Sed Rate: 40 mm/hr — ABNORMAL HIGH (ref 0–16)

## 2021-11-21 LAB — C-REACTIVE PROTEIN: CRP: 9.9 mg/dL — ABNORMAL HIGH (ref <1.0)

## 2021-11-21 NOTE — TOC Progression Note (Addendum)
Transition of Care (TOC) - Progression Note  ? ? ?Patient Details  ?Name: Sean Farrell ?MRN: [id number] ?Date of Birth: 06-02-1946 ? ?Transition of Care (TOC) CM/SW Contact  ?Angelita Ingles, RN ?Phone Number:[phone] ? ?11/21/2021, 10:08 AM ? ?Clinical Narrative:    ?CM at bedside to discuss disposition plan. Patient states that he is planning to discharge home. Patient states that he does have home health aide for 2 hours per day. CM offered patient choice for home health agency. Patient states that he does not have preference as long as agency will accept his insurance. DME rolling walker order has been called to [facility] and will be delivered to the room. Home health referral has been called to [place] with [facility]. Acceptance pending.   ? ?[place] [place] referral has been accepted by Angie with [facility].  ? ? ?  ?  ? ?Expected Discharge Plan and Services ?  ?  ?  ?  ?  ?                ?  ?  ?  ?  ?  ?  ?  ?  ?  ?  ? ? ?Social Determinants of Health (SDOH) Interventions ?  ? ?Readmission Risk Interventions ?   ? View : No data to display.  ?  ?  ?  ? ? ?

## 2021-11-21 NOTE — Consult Note (Addendum)
[place]  ?Telephone:([phone] [phone]) [phone]  ? ?MEDICAL ONCOLOGY - INITIAL CONSULTATION ? ?Referral MD: Dr. Cordelia Poche ? 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 ?HPI: Mr. Sean Farrell is a 76 year old male with a past medical history significant for hypertension, chronic combined CHF, tobacco dependence.  He presented to the emergency department with shortness of breath.  He had been experiencing feeling poorly for some time as well as having a cough and shortness of breath.  He also reported having night sweats for about the past month.  On admission, his WBC was 36.7, hemoglobin 10.6, platelets 538,000, albumin 2.4, alk phos 179, T. bili 1.8.  CT chest with contrast was performed on 11/10/2021 which showed constellation of findings consistent with metastatic disease involving multiple liver lesions and multiple bony lytic lesions in the thoracic spine and bilateral ribs.  There was no definite primary lung malignancy but there were multiple small solid noncalcified pulmonary nodules in both lungs measuring up to 5 mm which were indeterminate but could represent metastatic disease as well.  He had a modified loculated left pleural effusion and partial collapse of the lingula and left lower lobe and a small right pleural effusion.  Pulmonology was consulted and a left chest tube was placed.  He also received tPA/DNAase due to persistent loculations.  Pleural fluid was sent for cytology and no malignant cells were identified.  He had a CT of the abdomen/pelvis which showed small bilateral loculated pleural effusions with left greater than right pleural nodularity compatible with pleural metastatic disease, ill-defined liver lesions compatible with metastatic disease, ill-defined lesion in the tail of the pancreas possibly due to a primary malignancy or additional site of metastatic disease and MRI is recommended for follow-up.  He also had an  enlarged periportal lymph node and a lytic lesion in the lateral left ninth rib with soft tissue component and S1 lytic lesion compatible with osseous metastatic disease.  He underwent ultrasound-guided liver biopsy by IR on 11/15/2021 which showed poorly differentiated adenocarcinoma.  Overall, stains for nonspecific as to a primary site but there is some suggestion of primary renal cell carcinoma or pancreatic and gastric adenocarcinomas. ? ?I met with the patient today in his hospital room.  No family at the bedside.  The patient reports that he has not been feeling well for several months. He states that his urine was dark. Overall, he has been having fatigue.  He reports a poor appetite.  He also states that he has lost weight but cannot tell me how much weight he has lost.  He is not having any headaches or dizziness.  Denies chest pain.  Reports shortness of breath with exertion.  Denies cough or hemoptysis.  Denies abdominal pain, nausea, vomiting, constipation, diarrhea.  However, he states that he has black stools.  The patient is single.  He lives by himself.  He has 1 son that lives in the [place] area sounds like they are not in contact currently.  He has brothers and sisters who live locally.  He currently smokes a third a pack of cigarettes per day.  Denies alcohol use.  Family history significant for a mother with breast cancer, maternal uncle with pancreatic cancer, and maternal uncle with prostate cancer.  Medical oncology was asked see the patient make recommendations regarding his diagnosis of poorly differentiated adenocarcinoma. ? ? ?Past Medical History:  ?Diagnosis Date  ? Hypertension   ? Marijuana abuse   ? Tobacco abuse   ?  Tuberculosis 2010  ?: ? ?Past Surgical History:  ?Procedure Laterality Date  ? Left ankle surgery    ?: ? ?Current Facility-Administered Medications  ?Medication Dose Route Frequency Provider Last Rate Last Admin  ? acetaminophen (TYLENOL) tablet 650 mg  650 mg Oral Q6H PRN  Karmen Bongo, MD   650 mg at 11/19/21 2352  ? Or  ? acetaminophen (TYLENOL) suppository 650 mg  650 mg Rectal Q6H PRN Karmen Bongo, MD      ? albuterol (PROVENTIL) (2.5 MG/3ML) 0.083% nebulizer solution 2.5 mg  2.5 mg Nebulization Q2H PRN Karmen Bongo, MD      ? bisacodyl (DULCOLAX) EC tablet 5 mg  5 mg Oral Daily PRN Karmen Bongo, MD      ? docusate sodium (COLACE) capsule 100 mg  100 mg Oral BID Karmen Bongo, MD   100 mg at 11/20/21 2123  ? enoxaparin (LOVENOX) injection 40 mg  40 mg Subcutaneous Q24H Karmen Bongo, MD   40 mg at 11/19/21 1318  ? feeding supplement (ENSURE ENLIVE / ENSURE PLUS) liquid 237 mL  237 mL Oral TID BM Edwin Dada, MD   237 mL at 11/20/21 2123  ? guaiFENesin (MUCINEX) 12 hr tablet 600 mg  600 mg Oral BID PRN Karmen Bongo, MD      ? hydrALAZINE (APRESOLINE) injection 5 mg  5 mg Intravenous Q4H PRN Karmen Bongo, MD      ? multivitamin with minerals tablet 1 tablet  1 tablet Oral Daily Danford, Suann Larry, MD   1 tablet at 11/20/21 1026  ? nicotine (NICODERM CQ - dosed in mg/24 hours) patch 14 mg  14 mg Transdermal Daily Karmen Bongo, MD   14 mg at 11/20/21 1026  ? ondansetron (ZOFRAN) tablet 4 mg  4 mg Oral Q6H PRN Karmen Bongo, MD      ? Or  ? ondansetron [facility]) injection 4 mg  4 mg Intravenous Q6H PRN Karmen Bongo, MD      ? polyethylene glycol (MIRALAX / GLYCOLAX) packet 17 g  17 g Oral Daily PRN Karmen Bongo, MD      ? sodium chloride flush (NS) 0.9 % injection 3 mL  3 mL Intravenous Q12H Karmen Bongo, MD   3 mL at 11/20/21 2123  ? tiZANidine (ZANAFLEX) tablet 4 mg  4 mg Oral QHS PRN Karmen Bongo, MD   4 mg at 11/15/21 2126  ? umeclidinium bromide (INCRUSE ELLIPTA) 62.5 MCG/ACT 1 puff  1 puff Inhalation Daily Karmen Bongo, MD   1 puff at 11/20/21 2297  ? zolpidem (AMBIEN) tablet 5 mg  5 mg Oral Ivery Quale, MD   5 mg at 11/20/21 2123  ? ? ? ?No Known Allergies: ? ? ?Family History  ?Problem Relation Age of Onset  ?  Breast cancer Mother   ? Alcoholism Father   ? Breast cancer Sister   ?: ? ? ?Social History  ? ?Socioeconomic History  ? Marital status: Divorced  ?  Spouse name: Not on file  ? Number of children: Not on file  ? Years of education: Not on file  ? Highest education level: Not on file  ?Occupational History  ? Occupation: retired  ?Tobacco Use  ? Smoking status: Every Day  ?  Packs/day: 1.00  ?  Years: 54.00  ?  Pack years: 54.00  ?  Types: Cigarettes  ? Smokeless tobacco: Current  ?Substance and Sexual Activity  ? Alcohol use: No  ? Drug use: Yes  ?  Types:  Marijuana  ? Sexual activity: Not on file  ?Other Topics Concern  ? Not on file  ?Social History Narrative  ? Not on file  ? ?Social Determinants of Health  ? ?Financial Resource Strain: Not on file  ?Food Insecurity: Not on file  ?Transportation Needs: Not on file  ?Physical Activity: Not on file  ?Stress: Not on file  ?Social Connections: Not on file  ?Intimate Partner Violence: Not on file  ?: ? ?Review of Systems: A comprehensive 14 point review of systems was negative except as noted in the HPI. ? ?Exam: ?Patient Vitals for the past 24 hrs: ? BP Temp Temp src Pulse Resp SpO2 Weight  ?11/21/21 0756 124/68 99.5 ?F (37.5 ?C) Oral 99 (!) 25 92 % --  ?11/21/21 0400 120/63 98.4 ?F (36.9 ?C) Oral (!) 111 (!) 24 94 % --  ?11/21/21 0336 (!) 138/58 98.4 ?F (36.9 ?C) Oral (!) 126 (!) 26 92 % 76.1 kg  ?11/20/21 2302 126/74 99 ?F (37.2 ?C) Oral (!) 109 (!) 28 92 % --  ?11/20/21 1939 (!) 135/57 99.5 ?F (37.5 ?C) Oral 99 (!) 28 94 % --  ?11/20/21 1630 129/61 98.2 ?F (36.8 ?C) Oral (!) 118 (!) 22 100 % --  ?11/20/21 0837 -- -- -- -- -- 95 % --  ? ? ?General:   Laying in bed, no distress. ?Eyes:  no scleral icterus.   ?ENT:  There were no oropharyngeal lesions.   ?Lymphatics:  Negative cervical, supraclavicular or axillary adenopathy.   ?Respiratory: lungs were clear bilaterally without wheezing or crackles.   ?Cardiovascular: Tachycardic, occasional PVCs on monitor. ?GI:  Positive bowel sounds, soft, nontender, liver edge palpable.   ?Skin exam was without echymosis, petichae.   ?Neuro exam was nonfocal. Patient was alert and oriented.   ? ?Lab Results  ?Component Value Date

## 2021-11-21 NOTE — Progress Notes (Addendum)
Occupational Therapy Treatment ?Patient Details ?Name: Sean Farrell ?MRN: [id number] ?DOB: 08/19/1945 ?Today's Date: 11/21/2021 ? ? ?History of present illness 76 yo male presenting to ED on 5/5 with SOB and back pain. Chest CT showing concern for large cavitary lesion in the right upper lobe abutting the mediastinum. S/p L pigtail chest tube placed on 5/5. Liver biopsy 5/10. Onset of Afib during PT tx session 5/11, RN/MD notified. PMH including HTN, polysubstance abuse, mycobacterium tuberculosis  (2010), CHF, Hep C, and L ankle sx. ?  ?OT comments ? Pt currently supervision for transfer from bed to the recliner using the RW and short term mobility.  HR elevated from 118 at rest up to 144 with transfer, requiring pt to sit and rest before returning back to the bed.  Respirations also increasing up to 30-34 per minute.  Pt declined participation in selfcare tasks or toileting this session.  Still feel he should have initial 24 hr supervision at home and [facility] for follow-up.  Will continue to follow in acute care for OT.  Goals upgraded  ? ?Recommendations for follow up therapy are one component of a multi-disciplinary discharge planning process, led by the attending physician.  Recommendations may be updated based on patient status, additional functional criteria and insurance authorization. ?   ?Follow Up Recommendations ? Home health OT  ?  ?Assistance Recommended at Discharge Frequent or constant Supervision/Assistance  ?Patient can return home with the following ? A little help with walking and/or transfers;A little help with bathing/dressing/bathroom;Assistance with cooking/housework;Assist for transportation ?  ?Equipment Recommendations ? None recommended by OT  ?  ?   ?Precautions / Restrictions Precautions ?Precautions: Fall;Other (comment) ?Precaution Comments: tachycardia ?Restrictions ?Weight Bearing Restrictions: No  ? ? ?  ? ?Mobility Bed Mobility ?Overal bed mobility: Needs Assistance ?Bed Mobility:  Supine to Sit, Sit to Supine ?  ?  ?Supine to sit: Supervision ?Sit to supine: Supervision ?  ?  ?  ? ?Transfers ?Overall transfer level: Needs assistance ?Equipment used: Rolling walker (2 wheels) ?Transfers: Sit to/from Stand, Bed to chair/wheelchair/BSC ?Sit to Stand: Supervision ?Stand pivot transfers: Supervision ?  ?  ?  ?  ?General transfer comment: Mod instructional cueing for hand placement sit to stand. ?  ?  ?Balance Overall balance assessment: Needs assistance ?Sitting-balance support: No upper extremity supported, Feet supported ?Sitting balance-Leahy Scale: Good ?  ?  ?Standing balance support: During functional activity, No upper extremity supported ?Standing balance-Leahy Scale: Fair ?Standing balance comment: Pt reliant on use of the RW for balance with functional mobility. ?  ?  ?  ?  ?  ?  ?  ?  ?  ?  ?  ?   ? ?ADL either performed or assessed with clinical judgement  ? ?ADL Overall ADL's : Needs assistance/impaired ?  ?  ?Grooming: Supervision/safety;Standing ?Grooming Details (indicate cue type and reason): simulated at the sink ?  ?  ?  ?  ?  ?  ?  ?  ?Toilet Transfer: Supervision/safety;Rolling walker (2 wheels) ?Toilet Transfer Details (indicate cue type and reason): simulated ambulating to the bedside recliner ?  ?  ?  ?  ?Functional mobility during ADLs: Supervision/safety;Rolling walker (2 wheels) ?General ADL Comments: Pt declining toileting or B/D this session.  He did agree to practice transfer over to the bedside recliner with the RW.  HR at 118 BPM at rest increasing to 145 BPM with transfer.  Respirations also increasing up to 30-34.  Oxygen sats above 90% however. ?  ? ?  Extremity/Trunk Assessment   ?  ?  ?  ?  ?  ? ?   ?   ?   ? ?Cognition Arousal/Alertness: Awake/alert ?Behavior During Therapy: Flat affect ?Overall Cognitive Status: Within Functional Limits for tasks assessed ?Area of Impairment: Awareness ?  ?  ?  ?  ?  ?  ?  ?  ?  ?  ?  ?  ?  ?Awareness: Anticipatory ?  ?General  Comments: Pt with limited awareness of safety, needs cueing to reach back for surfaces when trying to sit down.  Instead keeps hands on the walker. ?  ?  ?   ?   ?   ?   ? ? ?Pertinent Vitals/ Pain       Pain Assessment ?Pain Assessment: Faces ?Faces Pain Scale: No hurt ? ?   ?   ? ?Frequency ? Min 2X/week  ? ? ? ? ?  ?Progress Toward Goals ? ?OT Goals(current goals can now be found in the care plan section) ? Progress towards OT goals: Progressing toward goals;Goals met and updated - see care plan ? ?Acute Rehab OT Goals ?Time For Goal Achievement: 12/08/21 ?Potential to Achieve Goals: Good  ?Plan Discharge plan remains appropriate   ? ?   ?AM-PAC OT "6 Clicks" Daily Activity     ?Outcome Measure ? ? Help from another person eating meals?: None ?Help from another person taking care of personal grooming?: A Little ?Help from another person toileting, which includes using toliet, bedpan, or urinal?: A Little ?Help from another person bathing (including washing, rinsing, drying)?: A Little ?Help from another person to put on and taking off regular upper body clothing?: A Little ?Help from another person to put on and taking off regular lower body clothing?: A Little ?6 Click Score: 19 ? ?  ?End of Session Equipment Utilized During Treatment: Rolling walker (2 wheels) ? ?OT Visit Diagnosis: Unsteadiness on feet (R26.81);Other abnormalities of gait and mobility (R26.89);Muscle weakness (generalized) (M62.81) ?  ?Activity Tolerance Patient limited by fatigue ?  ?Patient Left in bed;with call bell/phone within reach;with bed alarm set ?  ?Nurse Communication Mobility status (HR elevating) ?  ? ?   ? ?Time: 3007-6226 ?OT Time Calculation (min): 26 min ? ?Charges: OT General Charges ?$OT Visit: 1 Visit ?OT Treatments ?$Therapeutic Activity: 23-37 mins ? ?Kyleen Villatoro OTR/L ?11/21/2021, 1:00 PM ?

## 2021-11-21 NOTE — Progress Notes (Signed)
? ?PROGRESS NOTE ? ? ? ?Sean Farrell  LSL:[id number] DOB: March 29, 1946 DOA: 11/10/2021 ?PCP: Nolene Ebbs, MD ? ? ?Brief Narrative: ?Mr. Sean Farrell is a 76 y.o. M with hx stroke, smoking, hx TB in 2010, and chronic heart failure EF 40-45% who presented with cough, SOB and night sweats for maybe a month. ? ?In the ER, CXR showed cavitary PNA, effusion. ? ? ?5/5: Admitted on airborne precautions, chest tube placed, Pulm consulted, CT showing multiple liver lesions, bony lytic lesions, bilateral lung nodules, no obvious primary ?5/6: Obtaining CT abd/pel --> shows pancreatic tail mass; tPA/dornase x1 ?5/7: IR consulted for liver mass biopsy; lytics second dose ?5/10: To IR for liver US and biopsy vs drain placement ?5/11: Persistent fever/leukocytosis ?5/13: Repeat bcx ?5/14: Discontinue antibiotics, persistent leukocytosis ?5/15: ID consult. Liver biopsy: Adenocarcinoma, poorly differentiated ?5/16: Oncology consult ? ? ?Assessment and Plan: ? ?Lung consolidation ?Probable pneumonia ?Noted on imaging. Started on empiric antibiotic therapy with Vancomycin, Cefepime and azithromycin and transitioned to Ceftriaxone and doxycycline and finally to Zosyn and doxycycline. Leukocytosis persistent. Associated fevers. Completed doxycycline course. ? ?Fevers ?Persistent with associated leukocytosis. Unsure if this is infectious related. No symptoms per patient. Lung loculated fluid not highly suggestive of infection on fluid analysis/culture. Repeat blood cultures (5/13) with no growth to date. Procalcitonin elevated but stable. WBC stable. Continued fevers/leukocytosis. Antibiotics discontinued on 5/14. ID consulted on 5/15 and agrees with watching off of antibiotics ? ?Leukocytosis ?Persistent and significantly elevated. In setting of above likely infection vs malignancy. Pathology smear review without evidence of neoplastic process. Stable. ? ?Loculated pleural effusion ?Left chest tube placed on 5/5. PCCM managed and removed on  5/11. Persistent on repeat chest x-ray. ? ?History of tuberculosis ?Pleural fluid with negative AFB. Concern for possible active TB. Morning AFB x3 ordered to rule out active TB and negative. Airborne precautions discontinued. ? ?Lytic bone lesions ?In setting of long nodules and liver lesions. Concern for metastatic process. Liver biopsy performed with pathology pending. ? ?Liver lesions ?CT chest and abdomen/pelvis significant for multiple liver lesions concerning for metastatic disease. Radiology consulted for liver biopsy. Liver biopsy performed on 5/10. Pathology confirms adenocarcinoma but is poorly differentiated. ? ?Lung nodules ?CT chest from 5/5 significant for multiple small noncalcified pulmonary nodules. Liver biopsy confirmed adenocarcinoma but is poorly differentiated. ? ?Sinus tachycardia ?Secondary to fevers. ? ?Hyperbilirubinemia ?Mild. Stable. ? ?Metabolic acidosis ?Mild and now resolved. ? ?Thrombocytosis ?Likely reactive. Improved. ? ?Normocytic anemia ?Chronic. Stable. ? ?Cerebrovascular disease ?Not currently on aspirin or statin. ? ?Chronic combined systolic and diastolic heart failure ?LVEF of 40-45% from Transthoracic Echocardiogram on 07/08/17. Stable. ?-Continue metoprolol ? ?Tobacco use ?-Continue nicotine patch ? ?Hyponatremia ?Mild. Stable. ? ?Primary hypertension ?Well controlled. ?-Continue amlodipine and doxycycline ? ?Hypokalemia ?-Supplementation as needed ? ? ?DVT prophylaxis: Lovenox ?Code Status:   Code Status: Full Code ?Family Communication: None at bedside ?Disposition Plan: Discharge home pending improvement of leukocytosis if able, afebrile if able. ID/oncology recommendations. Likely home in 1-2 days.  ? ?Consultants:  ?PCCM ?Interventional radiology ?Infectious disease ? ?Procedures:  ?Chest tube insertion (5/5) ? ?Antimicrobials: ?Vancomycin ?Doxycycline ?Cefepime ?Ceftriaxone ?Azithromycin ?Zosyn  ? ? ?Subjective: ?No issues overnight. Afebrile over the last 24 hours.  Discussed pathology results with patient at bedside and he understands information provided. ? ?Objective: ?BP 124/68 (BP Location: Left Arm)   Pulse 99   Temp 99.5 ?F (37.5 ?C) (Oral)   Resp (!) 25   Ht '6\' 2"'$  (1.88 m)   Wt 76.1 kg  SpO2 92%   BMI 21.54 kg/m?  ? ?Examination: ? ?General exam: Appears calm and comfortable ?Respiratory system: Clear to auscultation. Respiratory effort normal. ?Cardiovascular system: S1 & S2 heard, slight tachycardia. No murmurs, rubs, gallops or clicks. ?Gastrointestinal system: Abdomen is nondistended, soft and nontender. Normal bowel sounds heard. ?Central nervous system: Alert and oriented. No focal neurological deficits. ?Musculoskeletal: No edema. No calf tenderness ?Skin: No cyanosis. No rashes ?Psychiatry: Judgement and insight appear normal. Mood & affect appropriate.  ? ? ?Data Reviewed: I have personally reviewed following labs and imaging studies ? ?CBC ?Lab Results  ?Component Value Date  ? WBC 40.0 (H) 11/21/2021  ? RBC 3.14 (L) 11/21/2021  ? HGB 9.3 (L) 11/21/2021  ? HCT 29.2 (L) 11/21/2021  ? MCV 93.0 11/21/2021  ? MCH 29.6 11/21/2021  ? PLT 419 (H) 11/21/2021  ? MCHC 31.8 11/21/2021  ? RDW 15.3 11/21/2021  ? LYMPHSABS 2.0 11/21/2021  ? MONOABS 2.9 (H) 11/21/2021  ? EOSABS 0.1 11/21/2021  ? BASOSABS 0.2 (H) 11/21/2021  ? ? ? ?Last metabolic panel ?Lab Results  ?Component Value Date  ? NA 134 (L) 11/20/2021  ? K 3.6 11/20/2021  ? CL 107 11/20/2021  ? CO2 20 (L) 11/20/2021  ? BUN 8 11/20/2021  ? CREATININE 0.80 11/20/2021  ? GLUCOSE 118 (H) 11/20/2021  ? GFRNONAA >60 11/20/2021  ? GFRAA >60 07/09/2017  ? CALCIUM 8.8 (L) 11/20/2021  ? PROT 6.0 (L) 11/18/2021  ? ALBUMIN 1.7 (L) 11/18/2021  ? BILITOT 2.0 (H) 11/18/2021  ? ALKPHOS 146 (H) 11/18/2021  ? AST 58 (H) 11/18/2021  ? ALT 18 11/18/2021  ? ANIONGAP 7 11/20/2021  ? ? ?GFR: ?Estimated Creatinine Clearance: 87.2 mL/min (by C-G formula based on SCr of 0.8 mg/dL). ? ?Recent Results (from the past 240 hour(s))   ?Expectorated Sputum Assessment w Gram Stain, Rflx to Resp Cult     Status: None  ? Collection Time: 11/11/21  2:12 PM  ? Specimen: Expectorated Sputum  ?Result Value Ref Range Status  ? Specimen Description EXPECTORATED SPUTUM  Final  ? Special Requests Immunocompromised  Final  ? Sputum evaluation   Final  ?  Sputum specimen not acceptable for testing.  Please recollect.   ?RESULT CALLED TO, READ BACK BY AND VERIFIED WITH: RN [email or web address] ON [id number] AT 1854 BY E.PARRISH ?Performed at [facility], [place] [address] [place], [place] [address] ?  ? Report Status 11/11/2021 FINAL  Final  ?Acid Fast Smear (AFB)     Status: None  ? Collection Time: 11/11/21  2:12 PM  ? Specimen: Sputum  ?Result Value Ref Range Status  ? AFB Specimen Processing Concentration  Final  ? Acid Fast Smear Negative  Final  ?  Comment: (NOTE) ?Performed At: [facility] ?[address] [place], [place] [id number] ?Rush Farmer MD [id number] ?  ? Source (AFB) SPUTUM  Final  ?  Comment: Performed at [facility], [place] [address] [place], [place] [address]  ?Acid Fast Smear (AFB)     Status: None  ? Collection Time: 11/12/21  7:45 AM  ? Specimen: Sputum  ?Result Value Ref Range Status  ? AFB Specimen Processing Concentration  Final  ? Acid Fast Smear Negative  Final  ?  Comment: (NOTE) ?Performed At: [place] ?[address] [place], [place] [id number] ?Rush Farmer MD [id number] ?  ? Source (AFB) SPUTUM  Final  ?  Comment: Performed at [facility], [place] [address], [place], [place]  [address]  ?Acid Fast Smear (AFB)     Status: None  ? Collection Time: 11/13/21  7:04 PM  ? Specimen: Sputum  ?Result Value Ref Range Status  ? AFB Specimen Processing Concentration  Final  ? Acid Fast Smear Negative  Final  ?  Comment: (NOTE) ?Performed At: [facility] ?[address] [place], [place] [id number] ?Rush Farmer MD [id number] ?  ? Source (AFB) EXPECTORATED SPUTUM  Final  ?  Comment: Performed at  [facility], [place] [address] [place], [place] [address]  ?Aerobic/Anaerobic Culture w Gram Stain (surgical/deep wound)     Status: None  ? Collection Time: 11/15/21 12:41 PM  ? Specimen: Tissue  ?Result

## 2021-11-21 NOTE — Progress Notes (Signed)
Physical Therapy Treatment ?Patient Details ?Name: Sean Farrell ?MRN: [id number] ?DOB: 09-23-1945 ?Today's Date: 11/21/2021 ? ? ?History of Present Illness Pt is a 76 y.o. male admitted 11/10/21 with SOB, back pain. Chest CT with lung consolidation, probable PNA; multiple liver lesions concerning for metastatic disease. Pt with loculated pleural effusion with chest tube placement 5/5-5/11. S/p liver biopsy 5/10; pathology confirms adenocarcinoma, poorly differentiated. Course complicated by persistent fever/leukocytosis. PMH includes HTN, TB (2010), CHF, Hep C, polysubstance abuse. ?  ?PT Comments  ? ? Today's session limited by pt c/o fatigue. Educ re: BLE therex for improving strength and activity tolerance (HEP provided), DME for d/c as pt endorses desire for rollator. Demonstrated rollator use but pt declined OOB mobility for transfer/gait training. Pt self-limiting mobility progression, pt unable to give specific reason beyond fatigue, reports he does want to continue working with rehab services. Max encouragement and education on role of PT and importance of mobility. Will continue to follow acutely as pt agreeable to participate. ? ?Sitting HR 110s, SpO2 95% on RA ?  ?Recommendations for follow up therapy are one component of a multi-disciplinary discharge planning process, led by the attending physician.  Recommendations may be updated based on patient status, additional functional criteria and insurance authorization. ? ?Follow Up Recommendations ? Home health PT ?  ?  ?Assistance Recommended at Discharge Intermittent Supervision/Assistance  ?Patient can return home with the following A little help with walking and/or transfers;A little help with bathing/dressing/bathroom;Assistance with cooking/housework;Direct supervision/assist for medications management;Direct supervision/assist for financial management;Assist for transportation ?  ?Equipment Recommendations ?  (TBD - RW vs. rollator)  ?   ?Recommendations for Other Services   ? ? ?  ?Precautions / Restrictions Precautions ?Precautions: Fall;Other (comment) ?Precaution Comments: tachycardia ?Restrictions ?Weight Bearing Restrictions: No  ?  ? ?Mobility ? Bed Mobility ?Overal bed mobility: Modified Independent ?  ?  ?  ?  ?  ?  ?General bed mobility comments: mod indep with HOB elevated and use of bed rails ?  ? ?Transfers ?  ?  ?  ?  ?  ?  ?  ?  ?  ?General transfer comment:  (pt declined OOB mobility; educ on correct technique for sit<>stands with rollator) ?  ? ?Ambulation/Gait ?  ?  ?  ?  ?  ?  ?  ?  ? ? ?Stairs ?  ?  ?  ?  ?  ? ? ?Wheelchair Mobility ?  ? ?Modified Rankin (Stroke Patients Only) ?  ? ? ?  ?Balance   ?  ?  ?  ?  ?  ?  ?  ?  ?  ?  ?  ?  ?  ?  ?  ?  ?  ?  ?  ? ?  ?Cognition Arousal/Alertness: Awake/alert ?Behavior During Therapy: Flat affect ?Overall Cognitive Status: No family/caregiver present to determine baseline cognitive functioning ?Area of Impairment: Safety/judgement, Awareness ?  ?  ?  ?  ?  ?  ?  ?  ?  ?  ?  ?  ?Safety/Judgement: Decreased awareness of deficits ?Awareness: Emergent ?  ?General Comments: difficult reasoning with pt regarding importance of more frequent activity; pt with apparent decreased desire to participate, but requests to stay on PT caseload ?  ?  ? ?  ?Exercises Other Exercises ?Other Exercises: [employer] HEP handout (Access Code [email or web address]) provided - SLR, supine bridge, supine heel slides, LAQ, repeated sit<>stand ? ?  ?General Comments General comments (skin integrity, edema, etc.):  HR 118 sitting up in bed, SpO2 95% on RA. increased time discussing safe d/c home; new RW already delivered to room, pt endorses interested in rollator - showed one to him and demonstrated safe use, pt declined gait training with it, but states, "I want that" - while this could certainly assist him with stability and energy conservation, informed pt I would not recommend unless he can demonstrate safe use (as rollator  more unstable than RW pt has been using) - pt continues to state he wants rollator, but still unwilling to practice mobilizing with it - will reattempt next session. pt only agreeable to therex ?  ?  ? ?Pertinent Vitals/Pain Pain Assessment ?Pain Assessment: Faces ?Faces Pain Scale: Hurts a little bit ?Pain Location: back ?Pain Intervention(s): Monitored during session  ? ? ?Home Living   ?  ?  ?  ?  ?  ?  ?  ?  ?  ?   ?  ?Prior Function    ?  ?  ?   ? ?PT Goals (current goals can now be found in the care plan section) Progress towards PT goals: Not progressing toward goals - comment (self-limiting with fatigue) ? ?  ?Frequency ? ? ? Min 3X/week ? ? ? ?  ?PT Plan Current plan remains appropriate  ? ? ?Co-evaluation   ?  ?  ?  ?  ? ?  ?AM-PAC PT "6 Clicks" Mobility   ?Outcome Measure ? Help needed turning from your back to your side while in a flat bed without using bedrails?: None ?Help needed moving from lying on your back to sitting on the side of a flat bed without using bedrails?: A Little ?Help needed moving to and from a bed to a chair (including a wheelchair)?: A Little ?Help needed standing up from a chair using your arms (e.g., wheelchair or bedside chair)?: A Little ?Help needed to walk in hospital room?: A Little ?Help needed climbing 3-5 steps with a railing? : A Lot ?6 Click Score: 18 ? ?  ?End of Session   ?Activity Tolerance: Patient limited by fatigue ?Patient left: in bed;with call bell/phone within reach;with bed alarm set ?Nurse Communication: Mobility status ?PT Visit Diagnosis: Unsteadiness on feet (R26.81);Difficulty in walking, not elsewhere classified (R26.2) ?  ? ? ?Time: 3664-4034 ?PT Time Calculation (min) (ACUTE ONLY): 20 min ? ?Charges:  $Therapeutic Exercise: 8-22 mins          ?          ? ?Mabeline Caras, PT, DPT ?Acute Rehabilitation Services  ?Pager [phone] ?Office [phone] ? ?Sean Farrell ?11/21/2021, 4:57 PM ? ?

## 2021-11-22 ENCOUNTER — Inpatient Hospital Stay (HOSPITAL_COMMUNITY): Payer: Medicare Other

## 2021-11-22 DIAGNOSIS — Z515 Encounter for palliative care: Secondary | ICD-10-CM

## 2021-11-22 DIAGNOSIS — C7951 Secondary malignant neoplasm of bone: Secondary | ICD-10-CM

## 2021-11-22 DIAGNOSIS — D72823 Leukemoid reaction: Secondary | ICD-10-CM | POA: Diagnosis present

## 2021-11-22 DIAGNOSIS — R7401 Elevation of levels of liver transaminase levels: Secondary | ICD-10-CM | POA: Diagnosis present

## 2021-11-22 DIAGNOSIS — Z7189 Other specified counseling: Secondary | ICD-10-CM | POA: Diagnosis not present

## 2021-11-22 DIAGNOSIS — A419 Sepsis, unspecified organism: Secondary | ICD-10-CM | POA: Diagnosis not present

## 2021-11-22 DIAGNOSIS — I493 Ventricular premature depolarization: Secondary | ICD-10-CM | POA: Diagnosis present

## 2021-11-22 DIAGNOSIS — B1920 Unspecified viral hepatitis C without hepatic coma: Secondary | ICD-10-CM | POA: Diagnosis present

## 2021-11-22 DIAGNOSIS — I428 Other cardiomyopathies: Secondary | ICD-10-CM | POA: Diagnosis not present

## 2021-11-22 DIAGNOSIS — C801 Malignant (primary) neoplasm, unspecified: Secondary | ICD-10-CM

## 2021-11-22 DIAGNOSIS — E8809 Other disorders of plasma-protein metabolism, not elsewhere classified: Secondary | ICD-10-CM | POA: Diagnosis present

## 2021-11-22 DIAGNOSIS — Z66 Do not resuscitate: Secondary | ICD-10-CM

## 2021-11-22 LAB — ECHOCARDIOGRAM COMPLETE
AR max vel: 3.05 cm2
AV Peak grad: 5.8 mmHg
Ao pk vel: 1.21 m/s
Height: 74 in
S' Lateral: 1.7 cm
Weight: 2730.18 oz

## 2021-11-22 LAB — CBC WITH DIFFERENTIAL/PLATELET
Abs Immature Granulocytes: 0 10*3/uL (ref 0.00–0.07)
Basophils Absolute: 0 10*3/uL (ref 0.0–0.1)
Basophils Relative: 0 %
Eosinophils Absolute: 0 10*3/uL (ref 0.0–0.5)
Eosinophils Relative: 0 %
HCT: 25.4 % — ABNORMAL LOW (ref 39.0–52.0)
Hemoglobin: 8.2 g/dL — ABNORMAL LOW (ref 13.0–17.0)
Lymphocytes Relative: 2 %
Lymphs Abs: 0.7 10*3/uL (ref 0.7–4.0)
MCH: 28.9 pg (ref 26.0–34.0)
MCHC: 32.3 g/dL (ref 30.0–36.0)
MCV: 89.4 fL (ref 80.0–100.0)
Monocytes Absolute: 2.4 10*3/uL — ABNORMAL HIGH (ref 0.1–1.0)
Monocytes Relative: 7 %
Neutro Abs: 31.1 10*3/uL — ABNORMAL HIGH (ref 1.7–7.7)
Neutrophils Relative %: 91 %
Platelets: 363 10*3/uL (ref 150–400)
RBC: 2.84 MIL/uL — ABNORMAL LOW (ref 4.22–5.81)
RDW: 15 % (ref 11.5–15.5)
WBC: 34.2 10*3/uL — ABNORMAL HIGH (ref 4.0–10.5)
nRBC: 0 % (ref 0.0–0.2)
nRBC: 0 /100 WBC

## 2021-11-22 LAB — COMPREHENSIVE METABOLIC PANEL
ALT: 20 U/L (ref 0–44)
AST: 75 U/L — ABNORMAL HIGH (ref 15–41)
Albumin: 1.5 g/dL — ABNORMAL LOW (ref 3.5–5.0)
Alkaline Phosphatase: 185 U/L — ABNORMAL HIGH (ref 38–126)
Anion gap: 5 (ref 5–15)
BUN: 10 mg/dL (ref 8–23)
CO2: 22 mmol/L (ref 22–32)
Calcium: 9 mg/dL (ref 8.9–10.3)
Chloride: 106 mmol/L (ref 98–111)
Creatinine, Ser: 0.78 mg/dL (ref 0.61–1.24)
GFR, Estimated: >60 mL/min (ref >60)
Glucose, Bld: 121 mg/dL — ABNORMAL HIGH (ref 70–99)
Potassium: 3.9 mmol/L (ref 3.5–5.1)
Sodium: 133 mmol/L — ABNORMAL LOW (ref 135–145)
Total Bilirubin: 1.6 mg/dL — ABNORMAL HIGH (ref 0.3–1.2)
Total Protein: 5.3 g/dL — ABNORMAL LOW (ref 6.5–8.1)

## 2021-11-22 LAB — HCV RNA QUANT
HCV Quantitative Log: 2.778 log10 IU/mL (ref >1.70)
HCV Quantitative: 600 IU/mL (ref >50)

## 2021-11-22 MED ORDER — LACTATED RINGERS IV SOLN: INTRAVENOUS | Status: DC

## 2021-11-22 MED ORDER — DRONABINOL 2.5 MG PO CAPS
5.0000 mg | ORAL_CAPSULE | Freq: Two times a day (BID) | ORAL | Status: DC
  Administered 2021-11-22 – 2021-11-25 (×6): 5 mg via ORAL
  Filled 2021-11-22 (×6): qty 2

## 2021-11-22 NOTE — Progress Notes (Signed)
?Progress Note ?Patient: Sean Farrell BMW:[id number] DOB: 1946/01/12 DOA: 11/10/2021  ?DOS: the patient was seen and examined on 11/22/2021 ? ?Brief hospital course: ?Sean Farrell is a 76 y.o. M with hx stroke, smoking, hx TB in 2010, and chronic heart failure EF 40-45% who presented with cough, SOB and night sweats for maybe a month. ? ?In the ER, CXR showed cavitary PNA, effusion. ? ? ?5/5: Admitted on airborne precautions, chest tube placed, Pulm consulted, CT showing multiple liver lesions, bony lytic lesions, bilateral lung nodules, no obvious primary ?5/6: Obtaining CT abd/pel --> shows pancreatic tail mass; tPA/dornase x1 ?5/7: IR consulted for liver mass biopsy; lytics second dose ?5/10: To IR for liver US and biopsy vs drain placement ?5/11: Persistent fever/leukocytosis ?5/13: Repeat bcx ?5/14: Discontinue antibiotics, persistent leukocytosis ?5/15: ID consult. 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  Work-up shows possible pneumonia as well as loculated pleural effusion. ?Started on IV antibiotics. ?PCCM was consulted for chest tube placement for loculated effusion. ?Patient had multiple nodules as well as liver lesions and bony metastatic lesions. ?Liver biopsy performed by radiology. ?Work-up came back positive for adenocarcinoma. ?Infectious work-up negative so far. ?Oncology consulted. ?Recommend outpatient follow-up with ?We will consult palliative care. ? ?  Loculated pleural effusion ?Treated with chest tube. ?Currently has small bowel effusion. ? ?Liver lesion  ?  Lytic bone lesions on xray ?  Cancer, metastatic to bone [facility]) ?Does not appear to be in any significant distress. ?For now monitor but ? ?Sepsis due to pneumonia [facility]) ?Treated with IV antibiotics for total of 7 days. ?Cultures so far negative. ? ?  History of  tuberculosis ?Evidence of prior current limitus disease.  AFB smear negative.  Out of airborne precaution. ? ?  Leukemoid reaction ?ID consulted to persistent leukocytosis despite antibiotic as well as fever. ?Appears to have leukemoid reaction in response to his malignancy. ?Currently monitored off of the antibiotics and so far has no fever. ?No further therapy for now. ? ?  Hypertension ?  Hypotension ?Blood pressure soft.  Actually unable to complete orthostatic due to fatigue and dizziness. ?We will provide IV fluids and monitor response. ?If no improvement may require IV albumin. ?Currently holding all antihypertensive regimen. ? ?  Chronic combined systolic (congestive) and diastolic (congestive) heart failure (HCC) ?  Edema due to hypoalbuminemia ?  Frequent PVCs ?Frequent PVCs seen on telemetry on 5/17. ?Prior history of cardiomyopathy. ?Appears to have some volume overload. ?And tachypneic. ?We will get an echocardiogram for further work-up and monitor. ? ?  Protein-calorie malnutrition, severe ?Patient with severe malnutrition in the setting of malignancy. ?Discussed with dietitian.  Patient will benefit from aggressive nutritional therapy with core track if patient's goal is to pursue aggressive therapy. ?Will initiate Marinol for now and monitor response for appetite stimulation. ? ?Body mass index is 21.91 kg/m?Marland Kitchen ?Nutrition Problem: Severe Malnutrition ?Etiology: chronic illness (CHF, polysubstance abuse, cavitary lesion) ?Nutrition Interventions: ?Interventions: Ensure Enlive (each supplement provides 350kcal and 20 grams of protein), Magic cup, MVI, Refer to RD note for recommendations  ? ?  Hyperbilirubinemia ?Work-up so far negative for any obstruction. ?AST continues to remain elevated. ?Monitor for now. ? ?  Sinus tachycardia ?Likely from dehydration. ?Presuming hydration will monitor. ? ?Metabolic acidosis ?From IV fluids with resultant hyperchloremia. ? ? history of ischemic stroke ?No evidence  of acute stroke. ?Monitor. ? ?Hepatitis C  infection ?ID recommend outpatient follow-up. ? ?Tobacco abuse ?Smoking cessation recommended. ? ?Normocytic anemia ?Hemoglobin stable for now.  Monitor. ? ?Subjective: Patient's concern is dehydration.  No nausea no vomiting no fever no chills.  No pain anywhere reported.  Negligible oral intake. ? ?Physical Exam: ?Vitals:  ? 11/22/21 0840 11/22/21 1122 11/22/21 1629 11/22/21 1936  ?BP:  (!) 105/56 (!) 116/57 (!) 120/28  ?Pulse:  98 (!) 103 98  ?Resp:  (!) 29 (!) 27 20  ?Temp:  99.3 ?F (37.4 ?C) 99.7 ?F (37.6 ?C) 98.1 ?F (36.7 ?C)  ?TempSrc:  Oral Oral Oral  ?SpO2: 92% 95% 93% 93%  ?Weight:      ?Height:      ? ?General: Appear in moderate distress; no visible Abnormal Neck Mass Or lumps, Conjunctiva normal ?Cardiovascular: S1 and S2 Present, no Murmur, ?Respiratory: increased respiratory effort, Bilateral Air entry present and  bilateral Crackles, no wheezes ?Abdomen: Bowel Sound present, Non tender ?Extremities: trace Pedal edema ?Neurology: alert and oriented to time, place, and person ?Gait not checked due to patient safety concerns  ? ?Data Reviewed: ?I have Reviewed nursing notes, Vitals, and Lab results since pt's last encounter. Pertinent lab results CBC and BMP ?I have ordered test including CBC and BMP ?I have reviewed the last note from medical oncology,   ? ?Family Communication: None at bedside ? ?Disposition: ?Status is: Inpatient ?Remains inpatient appropriate because: Poor p.o. intake with resultant hemoconcentration requiring IV fluids.  Continue engagement for goals of care conversation. ? ?Author: ?Berle Mull, MD ?11/22/2021 7:51 PM ? ?Please look on [email or web address] to find out who is on call. ?

## 2021-11-22 NOTE — Progress Notes (Signed)
Nutrition Follow-up ? ?DOCUMENTATION CODES:  ? ?Severe malnutrition in context of chronic illness ? ?INTERVENTION:  ? ?If within [place], consider Cortrak placement and initiation of enteral nutrition: ?- Start Osmolite 1.5 @ 20 ml/hr and advance by 10 ml q 6 hours to goal rate of 60 ml/hr (1440 ml/day) ?- ProSource TF 45 ml BID ? ?Recommended tube feeding regimen at goal rate would provide 2240 kcal, 112 grams of protein, and 1097 ml of H2O. ? ?- Continue Ensure Enlive po TID, each supplement provides 350 kcal and 20 grams of protein ? ?- Continue Magic Cup TID with meals, each supplement provides 290 kcal and 9 grams of protein ? ?- Continue MVI with minerals daily ? ?- Encourage PO intake and provide feeding assistance as needed ? ?NUTRITION DIAGNOSIS:  ? ?Severe Malnutrition related to chronic illness (CHF, polysubstance abuse, cavitary lesion) as evidenced by severe fat depletion, severe muscle depletion. ? ?Ongoing, being addressed via oral nutrition supplements ? ?GOAL:  ? ?Patient will meet greater than or equal to 90% of their needs ? ?Progressing ? ?MONITOR:  ? ?PO intake, Supplement acceptance, Labs, Weight trends ? ?REASON FOR ASSESSMENT:  ? ?Consult ?Assessment of nutrition requirement/status ? ?ASSESSMENT:  ? ?76 year old male who presented to the ED on 5/05 with SOB, back pain, dehydration. PMH of tuberculosis (2012), hepatitis C, polysubstance abuse, HTN, CHF. Pt admitted with PNA, cavitary lesion in LLL. ? ?05/05 - s/p L chest tube placement ?05/10 - s/p biopsy of liver ?05/11 - chest tube removed ? ?Reviewed hospital course wince last assessment and liver biopsy from 5/10 shows poorly differentiated adenocarcinoma. Oncology met with pt yesterday and discussed with pt that his results are suggestive of metastatic pancreatic cancer and that his disease is not curable. Follow-up is planned outpatient at the cancer center. ? ?Pt has continued to have extremely poor intake of meals and supplements since  last assessment. Discussed with RN, pt is reluctant to even pick up his drink from bedside table to take in adequate fluids. Not drinking supplements. ? ?Pt resting in bed at the time of assessment. Lunch tray at bedside noted to be untouched. Talked with pt about his poor intake and inquired what the root cause was. Pt kept saying he "didn't have a taste" for anything. Appetite is poor in general and he has not been consuming adequate fluids or food. Pt does report he is drinking some of the ensure each day. Was vague about the amount but states he is drinking "as much as he can." Will continue for now. ? ?Noted in chart that pt is too lethargic to work with therapy and RN reports that he was too weak to stand up this AM. Per MD, pt is medically stable aside from being extremely weak and deconditioned.  ? ?Discussed the relationship between nutrition and energy. Shared with pt that in order to regain some of his strength that would be required to be discharged, we need to get adequate nutrition in him. Discussed the use of a cortrak feeding tube as a way to ensure we could provide him the energy and hydration his body needs to support that recovery. Talked about the placement process and that it would be a short term solution while hospitalized. Pt hesitant, but did agree he was not going to be able to meet needs orally and is agreeable to placement of the tube.  ? ?Discussed with MD. Reports that he is starting marinol as an appetite stimulant today to see if pt  can improve his intake as he would not be discharged with a cortrak tube. Also placed a palliative care consult so that goals of care can be explored and established with pt prior to placement of tube for artifical feeds. If in align with goals of care and intake has not improved, cortrak will be placed on Friday. Relayed plan to RN. ? ?Admit weight: 73.9 kg ?Current weight: 77.4 kg ? ?Pt with non pitting edema to BLE 5/16 ? ?Meal Completion: 10% x 2 meals  since last assessment ? ?Nutritionally Relevant Medications: ?Scheduled Meds: ? docusate sodium  100 mg Oral BID  ? Ensure Enlive  237 mL Oral TID BM  ? multivitamin with minerals  1 tablet Oral Daily  ? ?PRN Meds: bisacodyl, ondansetron, polyethylene glycol ? ?Labs Reviewed: ?Na 133 ?Lab Results  ?Component Value Date  ? ALT 20 11/22/2021  ? AST 75 (H) 11/22/2021  ? ALKPHOS 185 (H) 11/22/2021  ? BILITOT 1.6 (H) 11/22/2021  ? ?I/O's: -1.3 L since admit ? ?NUTRITION - FOCUSED PHYSICAL EXAM: ?Flowsheet Row Most Recent Value  ?Orbital Region Severe depletion  ?Upper Arm Region Severe depletion  ?Thoracic and Lumbar Region Severe depletion  ?Buccal Region Severe depletion  ?Temple Region Severe depletion  ?Clavicle Bone Region Severe depletion  ?Clavicle and Acromion Bone Region Severe depletion  ?Scapular Bone Region Severe depletion  ?Dorsal Hand Severe depletion  ?Patellar Region Severe depletion  ?Anterior Thigh Region Severe depletion  ?Posterior Calf Region Severe depletion  ?Edema (RD Assessment) None  ?Hair Reviewed  ?Eyes Reviewed  ?Mouth Reviewed  ?Skin Reviewed  ?Nails Reviewed  ? ?Diet Order:   ?Diet Order   ? ?       ?  Diet regular Room service appropriate? Yes; Fluid consistency: Thin  Diet effective now       ?  ? ?  ?  ? ?  ? ? ?EDUCATION NEEDS:  ? ?Education needs have been addressed ? ?Skin:  Skin Assessment: Reviewed RN Assessment ? ?Last BM:  5/16 - type 7 ? ?Height:  ? ?Ht Readings from Last 1 Encounters:  ?11/10/21 $RemoveBe'6\' 2"'[id number]$  (1.88 m)  ? ? ?Weight:  ? ?Wt Readings from Last 1 Encounters:  ?11/22/21 77.4 kg  ? ? ?BMI:  Body mass index is 21.91 kg/m?. ? ?Estimated Nutritional Needs:  ? ?Kcal:  2200-2400 ? ?Protein:  110-125 grams ? ?Fluid:  >/= 2.0 L ? ? ? ?Ranell Patrick, RD, LDN ?Clinical Dietitian ?RD pager # available in [place]  ?After hours/weekend pager # available in [place] ?

## 2021-11-22 NOTE — Consult Note (Signed)
Consultation Note Date: 11/22/2021   Patient Name: Sean Farrell  DOB: 11-08-1945  MRN: [id number]  Age / Sex: 76 y.o., male  PCP: Sean Ebbs, MD Referring Physician: Lavina Hamman, MD  Reason for Consultation: Establishing goals of care  HPI/Patient Profile: 76 y.o. male  with past medical history of hypertension, CHF, TB in 2010, stroke, and tobacco dependence admitted on 11/10/2021 with shortness of breath and night sweats.  Found to have cavitary pneumonia.CT showing multiple liver lesions, bony lytic lesions, and bilateral lung nodules.  CT abdomen revealed pancreatic tail mass.  Liver biopsy revealed adenocarcinoma.  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  PMT consulted to discuss goals of care.  Clinical Assessment and Goals of Care: I have reviewed medical records including EPIC notes, labs and imaging, received report from RN, assessed the patient and then met with patient to discuss diagnosis prognosis, GOC, EOL wishes, disposition and options.  I introduced Palliative Medicine as specialized medical care for people living with serious illness. It focuses on providing relief from the symptoms and stress of a serious illness. The goal is to improve quality of life for both the patient and the family.   We discussed patient's current illness and what it means in the larger context of patient's on-going co-morbidities.  Natural disease trajectory and expectations at EOL were discussed.  Sean Farrell does tell me he understands he has cancer but when I say that it has spread he seems very confused.  He was educated on metastatic cancer.  We talked about treatment options, discussed that this is not a curable condition.  We talked about the option of chemotherapy.  He tells me he plans to follow-up with oncology outpatient to further discuss treatment options.  I attempted to elicit values and goals of care important to  the patient.    Encouraged patient to consider DNR/DNI status understanding evidenced based poor outcomes in similar hospitalized patients, as the cause of the arrest is likely associated with chronic/terminal disease rather than a reversible acute cardio-pulmonary event.   Discussed with patient the importance of continued conversation with family and the medical providers regarding overall plan of care and treatment options, ensuring decisions are within the context of the patients values and GOCs.    Discussed with Sean Farrell about healthcare power of attorney.  Tells me his next of kin is his son but he would not want his son to make medical decisions for him, his son does not live locally and from our conversation it seems they are estranged.  He tells me he has siblings that are local and he would want his Sister Sean Farrell to make decisions for him if he were unable.  He tells me her phone number is [phone].  He is also agreeable to completing HCPOA paperwork to name Sean Farrell as [facility]  Questions and concerns were addressed. The family was encouraged to call with questions or concerns.  Primary Decision Maker PATIENT    SUMMARY OF RECOMMENDATIONS   CODE STATUS changed to DNR per patient's wishes Consulted spiritual care for HCPOA documentation -patient would like to name Sister Sean Farrell Referral to outpatient palliative in the cancer center Patient plans to follow-up with oncology outpatient to further discuss treatment options Denies symptom management needs  Code Status/Advance Care Planning: DNR     Primary Diagnoses: Present on Admission:  Tobacco abuse  Hypertension  Chronic combined systolic (congestive) and diastolic (congestive) heart failure (HCC)  Loculated pleural effusion  Liver  lesion   History of tuberculosis  Normocytic anemia  Hyponatremia  Metabolic acidosis  Hyperbilirubinemia  Sinus tachycardia  Lytic bone lesions on xray  Protein-calorie  malnutrition, severe  Edema due to hypoalbuminemia  Leukemoid reaction  Hepatitis C infection  Cancer, metastatic to bone (HCC)  Adenocarcinoma poorly differentiated, 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 The following aspects are pertinent.  Past Medical History:  Diagnosis Date   Hypertension    Marijuana abuse    Tobacco abuse    Tuberculosis 2010   Social History   Socioeconomic History   Marital status: Divorced    Spouse name: Not on file   Number of children: Not on file   Years of education: Not on file   Highest education level: Not on file  Occupational History   Occupation: retired  Tobacco Use   Smoking status: Every Day    Packs/day: 1.00    Years: 54.00    Pack years: 54.00    Types: Cigarettes   Smokeless tobacco: Current  Substance and Sexual Activity   Alcohol use: No   Drug use: Yes    Types: Marijuana   Sexual activity: Not on file  Other Topics Concern   Not on file  Social History Narrative   Not on file   Social Determinants of Health   Financial Resource Strain: Not on file  Food Insecurity: Not on file  Transportation Needs: Not on file  Physical Activity: Not on file  Stress: Not on file  Social Connections: Not on file   Family History  Problem Relation Age of Onset   Breast cancer Mother    Alcoholism Father    Breast cancer Sister    Scheduled Meds:  docusate sodium  100 mg Oral BID   dronabinol  5 mg Oral BID AC   enoxaparin (LOVENOX) injection  40 mg Subcutaneous Q24H   feeding supplement  237 mL Oral TID BM   multivitamin with minerals  1 tablet Oral Daily   nicotine  14 mg Transdermal Daily   sodium chloride flush  3 mL Intravenous Q12H   umeclidinium bromide  1 puff Inhalation Daily   zolpidem  5 mg Oral QHS   Continuous  Infusions: PRN Meds:.acetaminophen **OR** acetaminophen, albuterol, bisacodyl, guaiFENesin, ondansetron **OR** ondansetron (ZOFRAN) IV, polyethylene glycol, tiZANidine No Known Allergies Review of Systems  Constitutional:  Positive for activity change and fatigue.   Physical Exam Constitutional:      General: He is not in acute distress.    Appearance: He is ill-appearing.  Pulmonary:     Effort: Pulmonary effort is normal.  Skin:    General: Skin is warm and dry.  Neurological:     Mental Status: He is alert and oriented to person, place, and time.    Vital Signs: BP (!) 105/56 (BP Location: Left Arm)   Pulse 98   Temp 99.3 F (37.4 C) (Oral)   Resp (!) 29   Ht $R'6\' 2"'pK$  (1.88 m)   Wt 77.4 kg   SpO2 95%   BMI 21.91 kg/m  Pain Scale: 0-10 POSS *See Group Information*: S-Acceptable,Sleep, easy to arouse Pain Score: 0-No pain   SpO2: SpO2: 95 % O2 Device:SpO2: 95 % O2 Flow Rate: .O2 Flow Rate (L/min): 2 L/min  IO: Intake/output summary:  Intake/Output Summary (Last 24  hours) at 11/22/2021 1628 Last data filed at 11/21/2021 2140 Gross per 24 hour  Intake 3 ml  Output --  Net 3 ml    LBM: Last BM Date : 11/21/21 Baseline Weight: Weight: 77.1 kg Most recent weight: Weight: 77.4 kg     Palliative Assessment/Data: PPS 40%     *Please note that this is a verbal dictation therefore any spelling or grammatical errors are due to the "[employer] One" system interpretation.   Juel Burrow, DNP, AGNP-C Palliative Medicine Team [phone] Pager: [phone]

## 2021-11-22 NOTE — Progress Notes (Signed)
? ?[facility] Infectious Diseases Follow Up Note ? ?Patient Identification: ?Patient Name: Sean Farrell MRN: [id number] [place] Date: 11/10/2021  8:21 AM ?Age: 76 y.o.Today's Date: 11/22/2021 ? ?Reason for Visit: Leukocytosis  ? ?Principal Problem: ?  Lung consolidation ([place]) ?Active Problems: ?  Hypertension ?  Hyponatremia ?  Tobacco abuse ?  Chronic combined systolic (congestive) and diastolic (congestive) heart failure (HCC) ?  Loculated pleural effusion ?  Liver lesion  ?  Cerebrovascular disease ?  History of tuberculosis ?  Normocytic anemia ?  Metabolic acidosis ?  Hyperbilirubinemia ?  Sinus tachycardia ?  Lytic bone lesions on xray ?  Protein-calorie malnutrition, severe ? ?Antibiotics:  ?Vancomycin 5/5-5/6 ?Doxycycline 5/6-5/10 ?Cefepime 5/5, ceftriaxone 5/6, Zosyn 5/7- 5/14 ?  ?Lines/Hardware: PIV ? ?Interval Events: afebrile for more than 48 hrs now. Leukocytosis is downtrending. Liver biopsy + for poorly differentiated adenoca ? ? ?Assessment ?# Bilateral Pleural effusion ( Lt sided loculated>>Rt ) with multiple small pulmonary nodules s/p left  chest tube 5/5 ( Cx no growth, AFB and Fungal smear negative , path negative for malignant cells,  s/p fibrinolytics) ?- Exudative with possibility of malignant pleural effusion ?  ?# H/o TB , s/p completion of tx viaa DOTS ( 09/19/18 until 04/01/09 per Tammy Faucette at [facility]) ?- AFB sputum smear * 3 negative, Cx is pending ?  ?# Poorly differentiated adenocarcinoma with most likely primary being pancreas vs others with metastatic diseases to the liver, bones and pleura  ?- Oncology has been consulted with plans for further diagnostic work up/treatment vs hospice. Agrees leukocytosis could be leukemoid reaction in the setting of malignancy ? ?# HCV  - HCV RNA 600 11/21/21, [id number] in 2010, expect to spontaneously clear given low viral count  ? ?# Mild transaminitis  ?# Severe  PEM/Hypoalbuminemia ? ?Recommendations ?Low suspicion for infectious case of leukocytosis and possibly leukemoid reaction. Leukocytosis is already downtrending and patient has been afebrile for more than 48 hrs with no  new symptoms ? ?Further plan of care for malignancy per Oncology. Palliative care has been consulted as well. ? ?Will follow in the clinic for repeat HCV RNA for clearance vs needs to be treated pending [place] discussion for newly diagnosed malignancy ? ?ID available as needed, please call with questions ? ?Rest of the management as per the primary team. ?Thank you for the consult. Please page with pertinent questions or concerns. ? ?______________________________________________________________________ ?Subjective ?patient seen and examined at the bedside.  ?No new complaints ?Poor appetite ?Denies nausea, vomiting and diarrhea  ? ?Vitals ?BP (!) 106/49 (BP Location: Left Arm)   Pulse 95   Temp 98.7 ?F (37.1 ?C) (Oral)   Resp (!) 25   Ht '6\' 2"'$  (1.88 m)   Wt 77.4 kg   SpO2 92%   BMI 21.91 kg/m?  ? ?  ?Physical Exam ?Constitutional:  sitting up in the bed looks chronically sick  ?   Comments: Cachectic ? ?Cardiovascular:  ?   Rate and Rhythm: Normal rate and regular rhythm.  ?   Heart sounds:  ? ?Pulmonary:  ?   Effort: Pulmonary effort is normal on room air  ?   Comments:  ? ?Abdominal:  ?   Palpations: Abdomen is soft.  ?   Tenderness: non distended  ? ?Musculoskeletal:     ?   General: No swelling or tenderness.  ? ?Skin: ?   Comments: No obvious rashes  ? ?Neurological:  ?   General: awake, alert and oriented  ? ?  Psychiatric:     ?   Mood and Affect: Mood normal.  ? ?Pertinent Microbiology ?Results for orders placed or performed during the hospital encounter of 11/10/21  ?Resp Panel by RT-PCR (Flu A&B, Covid) Nasopharyngeal Swab     Status: None  ? Collection Time: 11/10/21 10:02 AM  ? Specimen: Nasopharyngeal Swab; Nasopharyngeal(NP) swabs in vial transport medium  ?Result Value Ref Range  Status  ? SARS Coronavirus 2 by RT PCR NEGATIVE NEGATIVE Final  ?  Comment: (NOTE) ?SARS-CoV-2 target nucleic acids are NOT DETECTED. ? ?The SARS-CoV-2 RNA is generally detectable in upper respiratory ?specimens during the acute phase of infection. The lowest ?concentration of SARS-CoV-2 viral copies this assay can detect is ?138 copies/mL. A negative result does not preclude SARS-Cov-2 ?infection and should not be used as the sole basis for treatment or ?other patient management decisions. A negative result may occur with  ?improper specimen collection/handling, submission of specimen other ?than nasopharyngeal swab, presence of viral mutation(s) within the ?areas targeted by this assay, and inadequate number of viral ?copies(<138 copies/mL). A negative result must be combined with ?clinical observations, patient history, and epidemiological ?information. The expected result is Negative. ? ?Fact Sheet for Patients:  ?EntrepreneurPulse.com.au ? ?Fact Sheet for Healthcare Providers:  ?IncredibleEmployment.be ? ?This test is no t yet approved or cleared by the [place] FDA and  ?has been authorized for detection and/or diagnosis of SARS-CoV-2 by ?FDA under an Emergency Use Authorization (EUA). This EUA will remain  ?in effect (meaning this test can be used) for the duration of the ?COVID-19 declaration under Section 564(b)(1) of the Act, 21 ?U.S.C.section 360bbb-3(b)(1), unless the authorization is terminated  ?or revoked sooner.  ? ? ?  ? Influenza A by PCR NEGATIVE NEGATIVE Final  ? Influenza B by PCR NEGATIVE NEGATIVE Final  ?  Comment: (NOTE) ?The Xpert Xpress SARS-CoV-2/FLU/RSV plus assay is intended as an aid ?in the diagnosis of influenza from Nasopharyngeal swab specimens and ?should not be used as a sole basis for treatment. Nasal washings and ?aspirates are unacceptable for Xpert Xpress SARS-CoV-2/FLU/RSV ?testing. ? ?Fact Sheet for  Patients: ?EntrepreneurPulse.com.au ? ?Fact Sheet for Healthcare Providers: ?IncredibleEmployment.be ? ?This test is not yet approved or cleared by the [place] FDA and ?has been authorized for detection and/or diagnosis of SARS-CoV-2 by ?FDA under an Emergency Use Authorization (EUA). This EUA will remain ?in effect (meaning this test can be used) for the duration of the ?COVID-19 declaration under Section 564(b)(1) of the Act, 21 U.S.C. ?section 360bbb-3(b)(1), unless the authorization is terminated or ?revoked. ? ?Performed at [facility], [place] [address] [place], [place] ?[address] ?  ?Respiratory (~20 pathogens) panel by PCR     Status: None  ? Collection Time: 11/10/21 10:02 AM  ? Specimen: Nasopharyngeal Swab; Respiratory  ?Result Value Ref Range Status  ? Adenovirus NOT DETECTED NOT DETECTED Final  ? Coronavirus 229E NOT DETECTED NOT DETECTED Final  ?  Comment: (NOTE) ?The Coronavirus on the Respiratory Panel, DOES NOT test for the novel  ?Coronavirus (2019 nCoV) ?  ? Coronavirus HKU1 NOT DETECTED NOT DETECTED Final  ? Coronavirus NL63 NOT DETECTED NOT DETECTED Final  ? Coronavirus OC43 NOT DETECTED NOT DETECTED Final  ? Metapneumovirus NOT DETECTED NOT DETECTED Final  ? Rhinovirus / Enterovirus NOT DETECTED NOT DETECTED Final  ? Influenza A NOT DETECTED NOT DETECTED Final  ? Influenza B NOT DETECTED NOT DETECTED Final  ? Parainfluenza Virus 1 NOT DETECTED NOT DETECTED Final  ?  Parainfluenza Virus 2 NOT DETECTED NOT DETECTED Final  ? Parainfluenza Virus 3 NOT DETECTED NOT DETECTED Final  ? Parainfluenza Virus 4 NOT DETECTED NOT DETECTED Final  ? Respiratory Syncytial Virus NOT DETECTED NOT DETECTED Final  ? Bordetella pertussis NOT DETECTED NOT DETECTED Final  ? Bordetella Parapertussis NOT DETECTED NOT DETECTED Final  ? Chlamydophila pneumoniae NOT DETECTED NOT DETECTED Final  ? Mycoplasma pneumoniae NOT DETECTED NOT DETECTED Final  ?  Comment: Performed at  [facility], [place] [address], [place], [place] [address]  ?Blood culture (routine x 2)     Status: None  ? Collection Time: 11/10/21 10:03 AM  ? Specimen: BLOOD  ?Result Value Ref Range Status  ? Specimen Description BLOOD BLOOD RIGHT ARM  Final  ? Special Requests   Final  ?  BOT

## 2021-11-23 ENCOUNTER — Encounter: Payer: Self-pay | Admitting: *Deleted

## 2021-11-23 DIAGNOSIS — J181 Lobar pneumonia, unspecified organism: Secondary | ICD-10-CM | POA: Diagnosis not present

## 2021-11-23 DIAGNOSIS — C7951 Secondary malignant neoplasm of bone: Secondary | ICD-10-CM | POA: Diagnosis not present

## 2021-11-23 DIAGNOSIS — A403 Sepsis due to Streptococcus pneumoniae: Secondary | ICD-10-CM | POA: Diagnosis not present

## 2021-11-23 DIAGNOSIS — C801 Malignant (primary) neoplasm, unspecified: Secondary | ICD-10-CM | POA: Diagnosis not present

## 2021-11-23 LAB — CBC WITH DIFFERENTIAL/PLATELET
Abs Immature Granulocytes: 0 10*3/uL (ref 0.00–0.07)
Basophils Absolute: 0 10*3/uL (ref 0.0–0.1)
Basophils Relative: 0 %
Eosinophils Absolute: 0.4 10*3/uL (ref 0.0–0.5)
Eosinophils Relative: 1 %
HCT: 26.6 % — ABNORMAL LOW (ref 39.0–52.0)
Hemoglobin: 8.6 g/dL — ABNORMAL LOW (ref 13.0–17.0)
Lymphocytes Relative: 2 %
Lymphs Abs: 0.7 10*3/uL (ref 0.7–4.0)
MCH: 29 pg (ref 26.0–34.0)
MCHC: 32.3 g/dL (ref 30.0–36.0)
MCV: 89.6 fL (ref 80.0–100.0)
Monocytes Absolute: 1.1 10*3/uL — ABNORMAL HIGH (ref 0.1–1.0)
Monocytes Relative: 3 %
Neutro Abs: 35.1 10*3/uL — ABNORMAL HIGH (ref 1.7–7.7)
Neutrophils Relative %: 94 %
Platelets: 415 10*3/uL — ABNORMAL HIGH (ref 150–400)
RBC: 2.97 MIL/uL — ABNORMAL LOW (ref 4.22–5.81)
RDW: 15 % (ref 11.5–15.5)
WBC: 37.3 10*3/uL — ABNORMAL HIGH (ref 4.0–10.5)
nRBC: 0 % (ref 0.0–0.2)
nRBC: 0 /100 WBC

## 2021-11-23 LAB — CULTURE, BLOOD (ROUTINE X 2)
Culture: NO GROWTH
Culture: NO GROWTH
Special Requests: ADEQUATE
Special Requests: ADEQUATE

## 2021-11-23 LAB — CANCER ANTIGEN 19-9: CA 19-9: 20 U/mL (ref 0–35)

## 2021-11-23 LAB — SURGICAL PATHOLOGY

## 2021-11-23 NOTE — Progress Notes (Signed)
Physical Therapy Treatment Patient Details Name: Sean Farrell MRN: [id number] DOB: [birth year] Today's Date: 11/23/2021   History of Present Illness Pt is a 76 y.o. male admitted 11/10/21 with SOB, back pain. Chest CT with lung consolidation, probable PNA; multiple liver lesions concerning for metastatic disease. Pt with loculated pleural effusion with chest tube placement 5/5-5/11. S/p liver biopsy 5/10; pathology confirms adenocarcinoma, poorly differentiated. Course complicated by persistent fever/leukocytosis. PMH includes HTN, TB (2010), CHF, Hep C, polysubstance abuse.   PT Comments    Pt slowly progressing with mobility, difficult to encourage increased activity outside of room. Today's session focused on transfer and gait trial with rollator; pt limited to short bouts of ambulation distance before needing to sit to recover secondary to fatigue. Pt would benefit from manual w/c for household and community distances; pt in agreement; CM notified. Will continue to follow acutely to address established goals.  HR up 148 with brief activity; SpO2 94% on RA    Recommendations for follow up therapy are one component of a multi-disciplinary discharge planning process, led by the attending physician.  Recommendations may be updated based on patient status, additional functional criteria and insurance authorization.  Follow Up Recommendations  Home health PT     Assistance Recommended at Discharge Intermittent Supervision/Assistance  Patient can return home with the following A little help with walking and/or transfers;A little help with bathing/dressing/bathroom;Assistance with cooking/housework;Direct supervision/assist for medications management;Direct supervision/assist for financial management;Assist for transportation   Equipment Recommendations  Wheelchair (measurements PT);Wheelchair cushion (measurements PT)    Recommendations for Other Services       Precautions / Restrictions  Precautions Precautions: Fall;Other (comment) Precaution Comments: tachycardia Restrictions Weight Bearing Restrictions: No     Mobility  Bed Mobility Overal bed mobility: Modified Independent Bed Mobility: Supine to Sit, Sit to Supine           General bed mobility comments: increased time and effort    Transfers Overall transfer level: Needs assistance Equipment used: Rollator (4 wheels) Transfers: Sit to/from Stand Sit to Stand: Supervision           General transfer comment: educ on locking/unlocking rollator brakes; 2x sit<>stand from EOB with supervision for balance    Ambulation/Gait Ambulation/Gait assistance: Min guard Gait Distance (Feet): 20 Feet Assistive device: Rollator (4 wheels) Gait Pattern/deviations: Trunk flexed, Narrow base of support, Step-through pattern, Decreased stride length Gait velocity: Decreased     General Gait Details: slow, mildly unsteady gait with rollator and intermittent min guard for balance; pt declines further distance secondary to fatigue, opting to lay down; HR up to 148   Stairs             Wheelchair Mobility    Modified Rankin (Stroke Patients Only)       Balance Overall balance assessment: Needs assistance Sitting-balance support: No upper extremity supported, Feet supported Sitting balance-Leahy Scale: Good     Standing balance support: During functional activity, No upper extremity supported Standing balance-Leahy Scale: Fair                              Cognition Arousal/Alertness: Awake/alert Behavior During Therapy: Flat affect Overall Cognitive Status: No family/caregiver present to determine baseline cognitive functioning Area of Impairment: Following commands, Safety/judgement, Awareness, Problem solving                       Following Commands: Follows one step commands with increased  time Safety/Judgement: Decreased awareness of deficits Awareness:  Emergent Problem Solving: Slow processing, Requires verbal cues General Comments: difficult to determine true cognitive impairment versus fatigue; pt seems to have low motivation to progress mobility        Exercises      General Comments General comments (skin integrity, edema, etc.): Difficult to encourage mobility progression. Pt walking ~20' in room, then sitting and laying flat on bed to 'recover' from significant fatigue before getting up to reposition in bed. discussed rollator a bit too unstable for patient; pt will require w/c for community distances; SW notified      Pertinent Vitals/Pain Pain Assessment Pain Assessment: Faces Faces Pain Scale: Hurts a little bit Pain Location: generalized Pain Descriptors / Indicators: Tiring, Sore Pain Intervention(s): Monitored during session, Repositioned    Home Living                          Prior Function            PT Goals (current goals can now be found in the care plan section) Acute Rehab PT Goals Patient Stated Goal: home PT Goal Formulation: With patient Time For Goal Achievement: December 15, 2021 Potential to Achieve Goals: Fair Progress towards PT goals: Progressing toward goals (slowly)    Frequency    Min 3X/week      PT Plan Frequency needs to be updated    Co-evaluation              AM-PAC PT "6 Clicks" Mobility   Outcome Measure  Help needed turning from your back to your side while in a flat bed without using bedrails?: None Help needed moving from lying on your back to sitting on the side of a flat bed without using bedrails?: A Little Help needed moving to and from a bed to a chair (including a wheelchair)?: A Little Help needed standing up from a chair using your arms (e.g., wheelchair or bedside chair)?: A Little Help needed to walk in hospital room?: A Little Help needed climbing 3-5 steps with a railing? : A Lot 6 Click Score: 18    End of Session   Activity Tolerance: Patient  limited by fatigue Patient left: in bed;with call bell/phone within reach;with bed alarm set Nurse Communication: Mobility status PT Visit Diagnosis: Unsteadiness on feet (R26.81);Difficulty in walking, not elsewhere classified (R26.2)     Time: 6203-5597 PT Time Calculation (min) (ACUTE ONLY): 17 min  Charges:  $Therapeutic Activity: 8-22 mins                     Mabeline Caras, PT, DPT Acute Rehabilitation Services  Pager [phone] Office [place] 11/23/2021, 2:36 PM

## 2021-11-23 NOTE — Progress Notes (Signed)
Foundation one testing requested for liver lesion [id number] from [facility] Pathology dept.

## 2021-11-23 NOTE — Progress Notes (Signed)
Patient suffers from weakness which impairs their ability to perform daily activities like bathing, dressing, feeding, grooming, and toileting in the home.  A cane, crutch, or walker will not resolve  issue with performing activities of daily living. A wheelchair will allow patient to safely perform daily activities. Patient is not able to propel themselves in the home using a standard weight wheelchair due to endurance and general weakness. Patient can self propel in the lightweight wheelchair. Length of need 12 months . Accessories: elevating leg rests (ELRs), wheel locks, extensions and anti-tippers.

## 2021-11-23 NOTE — Progress Notes (Signed)
Progress Note Patient: Sean Farrell XBL:[id number] DOB: 04-05-1946 DOA: 11/10/2021  DOS: the patient was seen and examined on 11/23/2021  Brief hospital course: Mr. Medford is a 76 y.o. M with hx stroke, smoking, hx TB in 2010, and chronic heart failure EF 40-45% who presented with cough, SOB and night sweats for maybe a month.  In the ER, CXR showed cavitary PNA, effusion.   5/5: Admitted on airborne precautions, chest tube placed, Pulm consulted, CT showing multiple liver lesions, bony lytic lesions, bilateral lung nodules, no obvious primary 5/6: Obtaining CT abd/pel --> shows pancreatic tail mass; tPA/dornase x1 5/7: IR consulted for liver mass biopsy; lytics second dose 5/10: To IR for liver US and biopsy vs drain placement 5/11: Persistent fever/leukocytosis 5/13: Repeat bcx 5/14: Discontinue antibiotics, persistent leukocytosis 5/15: ID consult. 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  Work-up shows possible pneumonia as well as loculated pleural effusion. Started on IV antibiotics. PCCM was consulted for chest tube placement for loculated effusion. Patient had multiple nodules as well as liver lesions and bony metastatic lesions. Liver biopsy performed by radiology. Work-up came back positive for adenocarcinoma. Infectious work-up negative so far. Oncology consulted. Recommend outpatient follow-up.   Loculated pleural effusion Sepsis due to pneumonia present on admission.   Treated with chest tube and antibiotics. Currently has small bowel effusion.   Liver lesion  Lytic bone lesions on xray Cancer, metastatic to bone [facility]) Does not appear to be in any significant distress. For now monitor but   History of tuberculosis Evidence of prior current limitus disease.  AFB smear negative.  Out of  airborne precaution.   Leukemoid reaction ID consulted to persistent leukocytosis despite antibiotic as well as fever. Appears to have leukemoid reaction in response to his malignancy. Currently monitored off of the antibiotics and so far has no fever. No further therapy for now.   History of hypertension Orthostatic hypotension Blood pressure soft.  Actually unable to complete orthostatic due to fatigue and dizziness. Continue IV fluids and monitor response. If no improvement may require IV albumin. Currently holding all antihypertensive regimen.   Chronic combined systolic (congestive) and diastolic (congestive) heart failure (HCC) Edema due to hypoalbuminemia Frequent PVCs Frequent PVCs seen on telemetry on 5/17. Prior history of cardiomyopathy. Appears to have some volume overload. Echocardiogram shows evidence of preserved EF, normal RV function no valvular abnormality.  Protein-calorie malnutrition, severe Patient with severe malnutrition in the setting of malignancy. Discussed with dietitian.  Patient will benefit from aggressive nutritional therapy with core track if patient's goal is to pursue aggressive therapy. Currently improving with Marinol.  Will monitor.  Hopefully we can avoid tube feeding.  Body mass index is 21.91 kg/m. Nutrition Problem: Severe Malnutrition Etiology: chronic illness (CHF, polysubstance abuse, cavitary lesion) Nutrition Interventions: Interventions: Ensure Enlive (each supplement provides 350kcal and 20 grams of protein), Magic cup, MVI, Refer to RD note for recommendations    Hyperbilirubinemia Work-up so far negative for any obstruction. AST continues to remain elevated. Monitor for now.   Sinus tachycardia Likely from dehydration. Presuming hydration will monitor.   Metabolic acidosis From IV fluids with resultant hyperchloremia.   history of ischemic stroke No evidence of acute stroke. Monitor.   Hepatitis C infection ID  recommend outpatient follow-up.   Tobacco abuse Smoking cessation recommended.   Normocytic anemia Hemoglobin stable for now.  Monitor.  Subjective: No nausea  no vomiting no fever no chills.  Oral intake improving.  No chest pain abdominal pain.  Had a bowel movement.  Breathing stable.  No new cough.  Physical Exam: Vitals:   11/23/21 0337 11/23/21 0746 11/23/21 0826 11/23/21 1123  BP: 121/61 122/60  139/65  Pulse: (!) 109 (!) 107  (!) 109  Resp: 20 (!) 30  (!) 29  Temp: 99.8 F (37.7 C) 100 F (37.8 C)  99.9 F (37.7 C)  TempSrc: Oral Oral    SpO2: 93% 96% 95% 94%  Weight: 78 kg     Height:       General: Appear in mild distress; no visible Abnormal Neck Mass Or lumps, Conjunctiva normal Cardiovascular: S1 and S2 Present, no Murmur, Respiratory: good respiratory effort, Bilateral Air entry present and CTA, no Crackles, no wheezes Abdomen: Bowel Sound present, Non tender  Extremities: trace Pedal edema Neurology: alert and oriented to time, place, and person Gait not checked due to patient safety concerns   Data Reviewed: I have Reviewed nursing notes, Vitals, and Lab results since pt's last encounter. Pertinent lab results CBC and BMP I have ordered test including CBC and BMP    Family Communication: None at bedside  Disposition: Status is: Inpatient Remains inpatient appropriate because: Poor p.o. intake requiring IV hydration.  May require feeding tube if intake remains about the same as today. Author: Berle Mull, MD 11/23/2021 8:08 PM  Please look on [email or web address] to find out who is on call.

## 2021-11-23 NOTE — Progress Notes (Signed)
This chaplain responded to the PMT consult for creating the Pt. HCPOA.  The chaplain educated the Pt. on the role of HCPOA and gave the Pt. the opportunity to ask clarifying questions.  The Pt. HCPOA document is filled out and placed in the Pt. chart while waiting for a Friday notary visit. The chaplain understands the Pt. sister-Joyce Owens Shark will serve as the Pt. Healthcare agent.    The chaplain will plan F/U on Friday. The Pt. hopes to notarize the document before d/c.   Chaplain Sallyanne Kuster [phone]

## 2021-11-23 NOTE — Progress Notes (Addendum)
HEMATOLOGY-ONCOLOGY PROGRESS NOTE  ASSESSMENT AND PLAN: 1.  Poorly differentiated adenocarcinoma, most consistent with metastatic pancreatic cancer -CT chest with contrast on 11/10/2021-multiple liver lesions and multiple bony lytic lesions in the thoracic spine and bilateral ribs, multiple noncalcified pulmonary nodules in both lungs. -Cytology from pleural fluid on 11/10/2021 was negative for malignant cells. -CT abdomen/pelvis with contrast 11/11/2021-right pleural nodularity compatible with pleural metastatic disease, ill-defined liver lesions compatible with hepatic metastatic disease, ill-defined lesion in the tail the pancreas, enlarged periportal lymph node, lytic lesion in the lateral left ninth rib with soft tissue component and S1 lytic lesion. -Ultrasound-guided liver biopsy from 11/15/2021 showed poorly differentiated adenocarcinoma with nonspecific staining for concern for renal cell carcinoma versus pancreatic/gastric adenocarcinoma. -CA 19.9 on 11/22/2021  was 20 2.  Pleural effusions 3.  Leukocytosis 4.  Normocytic anemia 5.  Thrombocytosis 6.  Transaminitis and hyperbilirubinemia 7.  Fever with negative cultures 8.  History of TB 9.  Chronic combined systolic and diastolic heart failure 10.  Hypertension 11.  Tobacco dependence 12.  Family history of multiple malignancies including breast cancer, pancreatic cancer, and prostate cancer all on the maternal side of his family 77.  Hepatitis C  Sean Farrell appears unchanged.  His CA 19.9 is normal.  However, based on imaging and biopsy results, the patient was likely has metastatic pancreatic cancer.  We will request for pathology to send tissue for Foundation One testing.  Explained to the patient that no therapy would be curative.  The patient seems willing at this time to consider systemic treatment options.  Plan will be for outpatient follow-up with cancer to discuss Foundation One test results and discuss systemic treatment  options.  If he decides against systemic treatment, will make referral to hospice.  Recommendations: 1.  Send pathology for Foundation One testing. 2.  Outpatient follow-up at the cancer to discuss systemic treatment options. 3.  Referral to genetic counseling as an outpatient. 4.  Please call oncology as needed.    Mikey Bussing, DNP, AGPCNP-BC, AOCNP  Sean Farrell was interviewed and examined.  He appears stable.  The CA 19-9 returned normal.  I feel the clinical presentation remains most consistent with metastatic pancreas cancer.  Continues to have a low-grade fever and elevated white count, most likely related to tumor fever and a leukemoid reaction. He appears to have malnutrition with a poor performance status.  He may not be a candidate for a trial of systemic therapy.  I attempted to contact his sister by telephone and there was no answer.  Outpatient follow-up will be scheduled at the Cancer center.  I was present for greater than 50% of today's visit.  I performed medical decision making.  Addendum: I discussed the case with his sister, [name], on 11/24/2021.  She understands the poor prognosis.  She will bring him to an office visit at the Cancer center to decide on hospice care versus a trial of systemic therapy.  SUBJECTIVE: No new complaints today.  Remains weak.  PHYSICAL EXAMINATION:  Vitals:   11/23/21 0337 11/23/21 0746  BP: 121/61 122/60  Pulse: (!) 109 (!) 107  Resp: 20 (!) 30  Temp: 99.8 F (37.7 C) 100 F (37.8 C)  SpO2: 93% 96%   Filed Weights   11/21/21 0336 11/22/21 0500 11/23/21 0337  Weight: 76.1 kg 77.4 kg 78 kg    Intake/Output from previous day: 05/17 0701 - 05/18 0700 In: 653.7 [P.O.:240; I.V.:413.7] Out: 125 [Urine:125]  Physical Exam Vitals reviewed.  Constitutional:  General: He is not in acute distress. HENT:     Head: Normocephalic.     Mouth/Throat:     Pharynx: No oropharyngeal exudate or posterior oropharyngeal erythema.   Eyes:     General: No scleral icterus.    Conjunctiva/sclera: Conjunctivae normal.  Cardiovascular:     Rate and Rhythm: Tachycardia present.  Pulmonary:     Effort: Pulmonary effort is normal. No respiratory distress.  Abdominal:     Palpations: Abdomen is soft.     Tenderness: There is no abdominal tenderness.     Comments: Liver edge palpable  Skin:    General: Skin is warm and dry.  Neurological:     Mental Status: He is alert and oriented to person, place, and time.    LABORATORY DATA:  I have reviewed the data as listed    Latest Ref Rng & Units 11/22/2021   12:40 AM 11/20/2021    2:59 AM 11/19/2021    1:12 AM  CMP  Glucose 70 - 99 mg/dL 121   118     BUN 8 - 23 mg/dL 10   8     Creatinine 0.61 - 1.24 mg/dL 0.78   0.80     Sodium 135 - 145 mmol/L 133   134     Potassium 3.5 - 5.1 mmol/L 3.9   3.6   4.2    Chloride 98 - 111 mmol/L 106   107     CO2 22 - 32 mmol/L 22   20     Calcium 8.9 - 10.3 mg/dL 9.0   8.8     Total Protein 6.5 - 8.1 g/dL 5.3      Total Bilirubin 0.3 - 1.2 mg/dL 1.6      Alkaline Phos 38 - 126 U/L 185      AST 15 - 41 U/L 75      ALT 0 - 44 U/L 20        Lab Results  Component Value Date   WBC 37.3 (H) 11/23/2021   HGB 8.6 (L) 11/23/2021   HCT 26.6 (L) 11/23/2021   MCV 89.6 11/23/2021   PLT 415 (H) 11/23/2021   NEUTROABS 35.1 (H) 11/23/2021    Lab Results  Component Value Date   CAN199 20 11/22/2021    DG Chest 2 View  Result Date: 11/18/2021 CLINICAL DATA:  Loculated pleural effusion, fever, shortness of breath EXAM: CHEST - 2 VIEW COMPARISON:  11/15/2021 FINDINGS: Interval removal of a previously seen pigtail chest tube about the left lung base. Otherwise unchanged AP portable examination with diffuse bilateral interstitial pulmonary opacity and loculated bilateral pleural effusions. No new airspace opacity. Heart and mediastinum are unremarkable. IMPRESSION: 1. Interval removal of a previously seen pigtail chest tube about the left  lung base. No significant pneumothorax. 2. Otherwise unchanged AP portable examination with diffuse bilateral interstitial pulmonary opacity and loculated bilateral pleural effusions. No new airspace opacity. Electronically Signed   By: Delanna Ahmadi M.D.   On: 11/18/2021 12:22   DG Chest 2 View  Result Date: 11/10/2021 CLINICAL DATA:  76 year old male with shortness of breath. Dehydration. EXAM: CHEST - 2 VIEW COMPARISON:  Chest radiographs 09/05/2016 and earlier. FINDINGS: Abnormal left lung base in 2018 with mild patchy opacity at that time. But now a moderate sized area of confluent opacity throughout the left mid and lower lung most resembles a loculated pleural effusion. Only a small volume of aerated left lung base. Chronic elevation of the left hemidiaphragm. Small  contralateral right pleural effusion. Some pleural fluid tracking in the fissures on the lateral view. No pneumothorax or pulmonary edema. Mediastinal contours which are not obscured appear stable and within normal limits. No acute osseous abnormality identified. Negative visible bowel gas. IMPRESSION: 1. Progressed abnormal left lung base since 2018 radiographs, felt at least in part related to pleural effusion with loculation. But otherwise indeterminate and recommend Chest CT with IV contrast to further characterize. 2. Small right pleural effusion is also new. Electronically Signed   By: Genevie Ann M.D.   On: 11/10/2021 09:08   CT Chest W Contrast  Result Date: 11/10/2021 CLINICAL DATA:  Shortness of breath and back pain.  History of TB. EXAM: CT CHEST WITH CONTRAST TECHNIQUE: Multidetector CT imaging of the chest was performed during intravenous contrast administration. RADIATION DOSE REDUCTION: This exam was performed according to the departmental dose-optimization program which includes automated exposure control, adjustment of the mA and/or kV according to patient size and/or use of iterative reconstruction technique. CONTRAST:  33m  OMNIPAQUE IOHEXOL 300 MG/ML  SOLN COMPARISON:  Chest x-ray from same day. CT chest dated September 17, 2008. FINDINGS: Cardiovascular: No significant vascular findings. Normal heart size. No pericardial effusion. No thoracic aortic aneurysm or dissection. Coronary, aortic arch, and branch vessel atherosclerotic vascular disease. No central pulmonary embolism. Mediastinum/Nodes: No enlarged mediastinal, hilar, or axillary lymph nodes. Thyroid gland, trachea, and esophagus demonstrate no significant findings. Lungs/Pleura: Moderate loculated left pleural effusion with partial collapse of the lingula and left lower lobe. Small right pleural effusion. Several small solid pulmonary nodules in both lungs measuring up to 5 mm nodule in the left upper lobe (series 3, image 40). Nodular scarring and calcification in the right upper and lower lobes. Mild centrilobular and paraseptal emphysema. No consolidation or pneumothorax. Upper Abdomen: Multiple centrally hypodense heterogeneously enhancing liver lesions measuring up to 6.9 cm Musculoskeletal: Multiple small lytic lesions scattered throughout the thoracic spine and bilateral ribs, new since 2010. IMPRESSION: 1. Constellation of findings consistent with metastatic disease including multiple liver lesions and multiple bony lytic lesions in the thoracic spine and bilateral ribs. No definite primary lung malignancy. Multiple small solid noncalcified pulmonary nodules in both lungs measuring up to 5 mm are indeterminate, but could represent metastatic disease as well. 2. Moderate loculated left pleural effusion with partial collapse of the lingula and left lower lobe. Small right pleural effusion. 3. Sequelae of prior granulomatous disease in the right lung. 4. Aortic Atherosclerosis (ICD10-I70.0) and Emphysema (ICD10-J43.9). Electronically Signed   By: WTitus DubinM.D.   On: 11/10/2021 12:02   CT ABDOMEN PELVIS W CONTRAST  Result Date: 11/11/2021 CLINICAL DATA:   Metastatic disease evaluation; * Tracking Code: BO * EXAM: CT ABDOMEN AND PELVIS WITH CONTRAST TECHNIQUE: Multidetector CT imaging of the abdomen and pelvis was performed using the standard protocol following bolus administration of intravenous contrast. RADIATION DOSE REDUCTION: This exam was performed according to the departmental dose-optimization program which includes automated exposure control, adjustment of the mA and/or kV according to patient size and/or use of iterative reconstruction technique. CONTRAST:  1058mOMNIPAQUE IOHEXOL 300 MG/ML  SOLN COMPARISON:  None Available. FINDINGS: Lower chest: Small bilateral loculated pleural effusions with left greater than right pleural nodularity, left-sided chest tube in place. Hepatobiliary: Ill-defined liver lesions. Reference lesion located in the central liver measuring 5.5 x 3.6 cm on series 3, image 33. Reference lesion of the right hepatic dome measuring 3.4 x 3.6 cm on series 3, image 19. Pancreas: No pancreatic  duct dilation. Ill-defined lesion of the pancreatic tail measuring 2.0 x 1.5 cm on series 3, image 27. Spleen: Normal in size without focal abnormality. Adrenals/Urinary Tract: Bilateral adrenal glands are unremarkable. No hydronephrosis or nephrolithiasis. Bladder is unremarkable. Stomach/Bowel: Stomach is within normal limits. Appendix appears normal. No evidence of bowel wall thickening, distention, or inflammatory changes. Vascular/Lymphatic: Aortic atherosclerosis. Enlarged periportal lymph node measuring 1.1 cm in short axis on series 3, image 37. Reproductive: Mild prostatomegaly. Other: No abdominal wall hernia or abnormality. No abdominopelvic ascites. Musculoskeletal: Lytic lesion of the lateral left 9th rib with soft tissue component and S1 lytic lesion. IMPRESSION: 1. Small bilateral loculated pleural effusions with left greater than right pleural nodularity, compatible with pleural metastatic disease. Left-sided chest tube in place. 2.  Ill-defined liver lesions, compatible with hepatic metastatic disease. 3. Ill-defined lesion of the tail of the pancreas, possibly due to primary malignancy or additional site of metastatic disease. Contrast-enhanced liver MRI could assist with further characterization. 4. Enlarged periportal lymph node. 5. Lytic lesion of the lateral left 9th rib with soft tissue component and S1 lytic lesion, compatible with osseous metastatic disease. Electronically Signed   By: Yetta Glassman M.D.   On: 11/11/2021 17:56   US BIOPSY (LIVER)  Result Date: 11/15/2021 INDICATION: Multiple liver lesions. EXAM: ULTRASOUND GUIDED CORE BIOPSY OF LIVER MEDICATIONS: None. ANESTHESIA/SEDATION: Moderate (conscious) sedation was employed during this procedure. A total of Versed 1.5 mg and Fentanyl 50 mcg was administered intravenously by radiology nursing. Moderate Sedation Time: 14 minutes. The patient's level of consciousness and vital signs were monitored continuously by radiology nursing throughout the procedure under my direct supervision. PROCEDURE: The procedure, risks, benefits, and alternatives were explained to the patient. Questions regarding the procedure were encouraged and answered. The patient understands and consents to the procedure. A time-out was performed prior to initiating the procedure. Ultrasound was performed of the liver. The abdominal wall was prepped with chlorhexidine in a sterile fashion, and a sterile drape was applied covering the operative field. A sterile gown and sterile gloves were used for the procedure. Local anesthesia was provided with 1% Lidocaine. A 17 gauge trocar needle was advanced into the left lobe of the liver. Attempted aspiration was performed. After needle positioning, coaxial 18 gauge core biopsy samples were obtained. Two samples were submitted in formalin for histologic analysis and a third in saline for tissue culture studies. COMPLICATIONS: None immediate. FINDINGS: Multiple  ill-defined lesions are identified by ultrasound throughout the liver which appears solid. The largest is within the medial left lobe and measures up to nearly 9 cm in estimated maximal diameter. Initial attempted aspiration yielded no fluid. Core biopsy yielded solid tissue. IMPRESSION: No evidence of liquefied abscess in the liver by ultrasound. Lesions appears solid and core biopsy yielded solid tissue. Two samples were sent for histologic analysis and a third for tissue culture studies. Electronically Signed   By: Aletta Edouard M.D.   On: 11/15/2021 13:43   DG CHEST PORT 1 VIEW  Result Date: 11/15/2021 CLINICAL DATA:  Follow-up pleural effusion. EXAM: PORTABLE CHEST 1 VIEW COMPARISON:  Multiple previous chest x-rays and recent chest CT. FINDINGS: The left basilar pleural drainage catheter is stable. A small residual pleural fluid collection is noted laterally. Persistent left lower lobe atelectasis. No pneumothorax. Persistent loculated appearing right pleural fluid collection with streaky right lung atelectasis. IMPRESSION: Stable left basilar pleural drainage catheter with a small residual pleural fluid collection laterally. Persistent loculated right pleural fluid collection. Electronically Signed  By: Marijo Sanes M.D.   On: 11/15/2021 08:12   DG CHEST PORT 1 VIEW  Result Date: 11/14/2021 CLINICAL DATA:  Pleural effusion on the left. EXAM: PORTABLE CHEST 1 VIEW COMPARISON:  Nov 13, 2021 chest x-ray. FINDINGS: Similar appearance of a small left pleural effusion with probable kinking of the left chest tube. No visible pneumothorax. Biapical pleuroparenchymal scarring. Similar bilateral social thickening. Similar cardiomediastinal silhouette. IMPRESSION: Similar appearance of a small left pleural effusion with probable kinking of the left chest tube. Recommend correlation with chest tube function. Electronically Signed   By: Margaretha Sheffield M.D.   On: 11/14/2021 11:09   DG Chest Port 1  View  Result Date: 11/13/2021 CLINICAL DATA:  Left pleural effusion EXAM: PORTABLE CHEST 1 VIEW COMPARISON:  11/12/2021 FINDINGS: Small left pleural effusion with a left-sided chest tube. Chest tube as it enters the pleural space appears kinked. Bilateral interstitial thickening. Trace right pleural effusion. No pneumothorax. Stable cardiomediastinal silhouette. No acute osseous abnormality. IMPRESSION: 1. Small left pleural effusion with a left-sided chest tube. Chest tube as it enters the pleural space appears kinked. Electronically Signed   By: Kathreen Devoid M.D.   On: 11/13/2021 08:13   DG CHEST PORT 1 VIEW  Result Date: 11/12/2021 CLINICAL DATA:  Left chest tube. EXAM: PORTABLE CHEST 1 VIEW COMPARISON:  Chest CT 11/10/2021, portable chest 11/11/2021 at 6:28 a.m. FINDINGS: 5:29 a.m., 11/12/2021. Pigtail tube thoracostomy in the lower left chest is in similar position, but today the tube appears to be kinked probably at the skin insertion. Loculated lateral and medial basal moderate-sized left pleural collections are slightly smaller today with patchy aeration in the intervening and overlying lung which could be atelectasis or consolidation. Small right pleural effusion is unchanged with associated haziness in the lateral right lower lung field which could be consolidation or atelectasis. The upper lung fields remain generally clear with COPD. There is no pneumothorax. The cardiac size is normal. Osteopenia. IMPRESSION: The pigtail left basal chest tube appears to be kinked probably at the skin insertion compared with yesterday's exam but it is in similar position. Loculated fluid in the medial and lateral left chest base is slightly smaller today with patchy aeration in the adjacent lung which could be atelectasis or pneumonitis. Stable aeration on the right with small pleural effusion and haziness in the lateral lower lung field. Electronically Signed   By: Telford Nab M.D.   On: 11/12/2021 07:12   DG  CHEST PORT 1 VIEW  Result Date: 11/11/2021 CLINICAL DATA:  76 year old male with history of pleural effusion. EXAM: PORTABLE CHEST 1 VIEW COMPARISON:  Chest x-ray 11/10/2021. FINDINGS: Small bore chest tube in position with pigtail reformed over the lower aspect of the left hemithorax, similarly positioned. There continues to be loculated pleural fluid in the mid to lower left hemithorax, most notably laterally, unchanged. Small right pleural effusion increased compared to the prior study. Increasing peripheral opacity in the right mid to lower lung. No pneumothorax. No evidence of pulmonary edema. Heart size is normal. Upper mediastinal contours are within normal limits. IMPRESSION: 1. Support apparatus, as above. 2. Worsening airspace consolidation in the periphery of the right mid to lower lung with increasing small right parapneumonic pleural effusion. 3. Persistent loculated left pleural fluid collection, stable. Electronically Signed   By: Vinnie Langton M.D.   On: 11/11/2021 06:46   DG CHEST PORT 1 VIEW  Result Date: 11/10/2021 CLINICAL DATA:  Recurrent pneumonia. Loculated left pleural effusion. Chest tube drainage.  EXAM: PORTABLE CHEST 1 VIEW COMPARISON:  Prior today FINDINGS: There has been placement of a left pleural pigtail catheter in the retrocardiac region. Decreased pleural fluid seen at the left lung base with a small medial left basilar pneumothorax. Loculated component pleural effusion along the left lateral chest wall is unchanged. Chronic pulmonary interstitial prominence again noted. Heart size is within normal limits. IMPRESSION: Decreased left pleural effusion following left pleural pigtail catheter placement. Small medial left basilar pneumothorax noted. No change in loculated portion of pleural effusion along left lateral chest wall. Electronically Signed   By: Marlaine Hind M.D.   On: 11/10/2021 15:12   ECHOCARDIOGRAM COMPLETE  Result Date: 11/22/2021    ECHOCARDIOGRAM REPORT    Patient Name:   WOODRUFF SKIRVIN Date of Exam: 11/22/2021 Medical Rec #:  [id number]        Height:       74.0 in Accession #:    [id number]       Weight:       170.6 lb Date of Birth:  07-12-1945        BSA:          2.032 m Patient Age:    6 years         BP:           119/61 mmHg Patient Gender: M                HR:           95 bpm. Exam Location:  Inpatient Procedure: 2D Echo, Cardiac Doppler and Color Doppler Indications:    Cardiomyopathy  History:        Patient has prior history of Echocardiogram examinations, most                 recent 07/08/2017.  Sonographer:    Jefferey Pica Referring Phys: [id number] [place]  1. Left ventricular ejection fraction, by estimation, is 60 to 65%. The left ventricle has normal function. The left ventricle has no regional wall motion abnormalities. There is mild concentric left ventricular hypertrophy. Left ventricular diastolic function could not be evaluated.  2. Right ventricular systolic function is normal. The right ventricular size is normal. There is normal pulmonary artery systolic pressure.  3. The mitral valve is normal in structure. No evidence of mitral valve regurgitation. No evidence of mitral stenosis.  4. The aortic valve is tricuspid. There is mild calcification of the aortic valve. Aortic valve regurgitation is not visualized. No aortic stenosis is present.  5. The inferior vena cava is normal in size with greater than 50% respiratory variability, suggesting right atrial pressure of 3 mmHg. Comparison(s): Changes from prior study are noted. EF improved compared to prior. Conclusion(s)/Recommendation(s): Normal biventricular function without evidence of hemodynamically significant valvular heart disease. FINDINGS  Left Ventricle: Left ventricular ejection fraction, by estimation, is 60 to 65%. The left ventricle has normal function. The left ventricle has no regional wall motion abnormalities. The left ventricular internal cavity size was  normal in size. There is  mild concentric left ventricular hypertrophy. Left ventricular diastolic function could not be evaluated due to nondiagnostic images. Left ventricular diastolic function could not be evaluated. Right Ventricle: The right ventricular size is normal. No increase in right ventricular wall thickness. Right ventricular systolic function is normal. There is normal pulmonary artery systolic pressure. The tricuspid regurgitant velocity is 2.74 m/s, and  with an assumed right atrial pressure of 3 mmHg, the estimated right ventricular  systolic pressure is 58.0 mmHg. Left Atrium: Left atrial size was normal in size. Right Atrium: Right atrial size was normal in size. Pericardium: There is no evidence of pericardial effusion. Mitral Valve: The mitral valve is normal in structure. No evidence of mitral valve regurgitation. No evidence of mitral valve stenosis. Tricuspid Valve: The tricuspid valve is normal in structure. Tricuspid valve regurgitation is mild . No evidence of tricuspid stenosis. Aortic Valve: The aortic valve is tricuspid. There is mild calcification of the aortic valve. Aortic valve regurgitation is not visualized. No aortic stenosis is present. Aortic valve peak gradient measures 5.8 mmHg. Pulmonic Valve: The pulmonic valve was grossly normal. Pulmonic valve regurgitation is trivial. No evidence of pulmonic stenosis. Aorta: The aortic root, ascending aorta, aortic arch and descending aorta are all structurally normal, with no evidence of dilitation or obstruction. Venous: The inferior vena cava is normal in size with greater than 50% respiratory variability, suggesting right atrial pressure of 3 mmHg. IAS/Shunts: The atrial septum is grossly normal.  LEFT VENTRICLE PLAX 2D LVIDd:         2.90 cm   Diastology LVIDs:         1.70 cm   LV e' medial:  5.85 cm/s LV PW:         1.20 cm   LV e' lateral: 8.49 cm/s LV IVS:        1.20 cm LVOT diam:     2.10 cm LV SV:         64 LV SV Index:   32  LVOT Area:     3.46 cm  RIGHT VENTRICLE RV S prime:     13.00 cm/s TAPSE (M-mode): 2.4 cm LEFT ATRIUM           Index       RIGHT ATRIUM           Index LA diam:      2.30 cm 1.13 cm/m  RA Area:     14.60 cm LA Vol (A2C): 15.3 ml 7.53 ml/m  RA Volume:   38.70 ml  19.05 ml/m  AORTIC VALVE                 PULMONIC VALVE AV Area (Vmax): 3.05 cm     PV Vmax:       1.03 m/s AV Vmax:        120.50 cm/s  PV Peak grad:  4.2 mmHg AV Peak Grad:   5.8 mmHg LVOT Vmax:      106.00 cm/s LVOT Vmean:     68.900 cm/s LVOT VTI:       0.186 m  AORTA Ao Root diam: 3.50 cm Ao Asc diam:  3.40 cm TRICUSPID VALVE TR Peak grad:   30.0 mmHg TR Vmax:        274.00 cm/s  SHUNTS Systemic VTI:  0.19 m Systemic Diam: 2.10 cm Buford Dresser MD Electronically signed by Buford Dresser MD Signature Date/Time: 11/22/2021/7:13:13 PM    Final      Future Appointments  Date Time Provider [place]  01/03/2022  9:00 AM Rosiland Oz, MD [facility]      LOS: 13 days

## 2021-11-24 DIAGNOSIS — C801 Malignant (primary) neoplasm, unspecified: Secondary | ICD-10-CM | POA: Diagnosis not present

## 2021-11-24 DIAGNOSIS — Z515 Encounter for palliative care: Secondary | ICD-10-CM | POA: Diagnosis not present

## 2021-11-24 DIAGNOSIS — Z7189 Other specified counseling: Secondary | ICD-10-CM | POA: Diagnosis not present

## 2021-11-24 DIAGNOSIS — C7951 Secondary malignant neoplasm of bone: Secondary | ICD-10-CM | POA: Diagnosis not present

## 2021-11-24 LAB — BASIC METABOLIC PANEL
Anion gap: 5 (ref 5–15)
BUN: 11 mg/dL (ref 8–23)
CO2: 21 mmol/L — ABNORMAL LOW (ref 22–32)
Calcium: 8.9 mg/dL (ref 8.9–10.3)
Chloride: 107 mmol/L (ref 98–111)
Creatinine, Ser: 0.85 mg/dL (ref 0.61–1.24)
GFR, Estimated: >60 mL/min (ref >60)
Glucose, Bld: 130 mg/dL — ABNORMAL HIGH (ref 70–99)
Potassium: 3.8 mmol/L (ref 3.5–5.1)
Sodium: 133 mmol/L — ABNORMAL LOW (ref 135–145)

## 2021-11-24 LAB — CBC WITH DIFFERENTIAL/PLATELET
Abs Immature Granulocytes: 0.8 10*3/uL — ABNORMAL HIGH (ref 0.00–0.07)
Basophils Absolute: 0.1 10*3/uL (ref 0.0–0.1)
Basophils Relative: 0 %
Eosinophils Absolute: 0.2 10*3/uL (ref 0.0–0.5)
Eosinophils Relative: 1 %
HCT: 24 % — ABNORMAL LOW (ref 39.0–52.0)
Hemoglobin: 7.9 g/dL — ABNORMAL LOW (ref 13.0–17.0)
Immature Granulocytes: 2 %
Lymphocytes Relative: 5 %
Lymphs Abs: 1.7 10*3/uL (ref 0.7–4.0)
MCH: 29.4 pg (ref 26.0–34.0)
MCHC: 32.9 g/dL (ref 30.0–36.0)
MCV: 89.2 fL (ref 80.0–100.0)
Monocytes Absolute: 2.5 10*3/uL — ABNORMAL HIGH (ref 0.1–1.0)
Monocytes Relative: 8 %
Neutro Abs: 27.7 10*3/uL — ABNORMAL HIGH (ref 1.7–7.7)
Neutrophils Relative %: 84 %
Platelets: 341 10*3/uL (ref 150–400)
RBC: 2.69 MIL/uL — ABNORMAL LOW (ref 4.22–5.81)
RDW: 15.1 % (ref 11.5–15.5)
WBC: 32.9 10*3/uL — ABNORMAL HIGH (ref 4.0–10.5)
nRBC: 0 % (ref 0.0–0.2)

## 2021-11-24 LAB — MAGNESIUM: Magnesium: 1.8 mg/dL (ref 1.7–2.4)

## 2021-11-24 MED ORDER — ORAL CARE MOUTH RINSE
15.0000 mL | Freq: Two times a day (BID) | OROMUCOSAL | Status: DC
  Administered 2021-11-23 – 2021-11-25 (×3): 15 mL via OROMUCOSAL

## 2021-11-24 MED ORDER — ALBUMIN HUMAN 5 % IV SOLN
12.5000 g | Freq: Once | INTRAVENOUS | Status: AC
  Administered 2021-11-24: 12.5 g via INTRAVENOUS
  Filled 2021-11-24: qty 250

## 2021-11-24 MED ORDER — PNEUMOCOCCAL 20-VAL CONJ VACC 0.5 ML IM SUSY
0.5000 mL | PREFILLED_SYRINGE | INTRAMUSCULAR | Status: AC
  Administered 2021-11-25: 0.5 mL via INTRAMUSCULAR
  Filled 2021-11-24: qty 0.5

## 2021-11-24 NOTE — Progress Notes (Signed)
This chaplain responded to Dr. Posey Pronto consult for spiritual care in addition to notarizing the Pt. Advance Directive: HCPOA.  The chaplain is present with the notary and witnesses for the notarizing of the Pt. HCPOA. The Pt. named Sean Farrell as his healthcare agent.  The chaplain gave the original AD to the Pt. along with one copy. The chaplain scanned the Pt. AD into the Pt. EMR.  The chaplain listened reflectively as the Pt. shared a little bit about himself. The chaplain understands the Pt. is always listening and chooses to address the conversations that are important to him. The Pt. states he prefers not to worry and only hold decisions one day at a time.   The chaplain understands the Pt. is not afraid of death. The Pt. shares he knew the end is near with this admission and has begun to plan accordingly. The Pt. shares he has spent time with his sister and trusts she knows what is important to the Pt.   The chaplain understands the Pt. prefers family prayer and invites the chaplain back for a visit as the chaplain pulled up the Pt. blanket.   Chaplain Sallyanne Kuster [phone]

## 2021-11-24 NOTE — Progress Notes (Signed)
Daily Progress Note   Patient Name: Sean Farrell       Date: 11/24/2021 DOB: [birth year]  Age: 76 y.o. MRN#: [id number] Attending Physician: Lavina Hamman, MD Primary Care Physician: Nolene Ebbs, MD Admit Date: 11/10/2021  Reason for Consultation/Follow-up: Establishing goals of care  Subjective: Patient minimally interactive with me this morning.  Denies complaints. Chaplain did meet with patient this morning and patient expressed he was preparing for the end of his life.  Dr. Posey Pronto spoke with patient's sister who is also named as patient's HCPOA and meeting was scheduled between me, Dr. Posey Pronto, and patient's sister.  Discussion below.  Length of Stay: 14  Current Medications: Scheduled Meds:   dronabinol  5 mg Oral BID AC   enoxaparin (LOVENOX) injection  40 mg Subcutaneous Q24H   feeding supplement  237 mL Oral TID BM   mouth rinse  15 mL Mouth Rinse BID   multivitamin with minerals  1 tablet Oral Daily   nicotine  14 mg Transdermal Daily   [START ON 11/25/2021] pneumococcal 20-valent conjugate vaccine  0.5 mL Intramuscular Tomorrow-1000   sodium chloride flush  3 mL Intravenous Q12H   umeclidinium bromide  1 puff Inhalation Daily    Continuous Infusions:  lactated ringers 75 mL/hr at 11/24/21 0700    PRN Meds: acetaminophen **OR** acetaminophen, albuterol, bisacodyl, guaiFENesin, ondansetron **OR** ondansetron (ZOFRAN) IV, polyethylene glycol  Physical Exam Constitutional:      General: He is not in acute distress.    Appearance: He is ill-appearing.     Comments: Thin, frail Minimally interactive, withdrawn, flat affect  Pulmonary:     Effort: Pulmonary effort is normal.  Skin:    General: Skin is warm and dry.            Vital Signs: BP (!) 111/54 (BP Location: Left Arm)    Pulse 81   Temp 99.1 F (37.3 C) (Oral)   Resp (!) 24   Ht '6\' 2"'$  (1.88 m)   Wt 78.1 kg   SpO2 94%   BMI 22.11 kg/m  SpO2: SpO2: 94 % O2 Device: O2 Device: Room Air O2 Flow Rate: O2 Flow Rate (L/min): 2 L/min  Intake/output summary:  Intake/Output Summary (Last 24 hours) at 11/24/2021 1321 Last data filed at 11/24/2021 0400 Gross per 24 hour  Intake 2090.26 ml  Output 525 ml  Net 1565.26 ml   LBM: Last BM Date : 11/23/21 Baseline Weight: Weight: 77.1 kg Most recent weight: Weight: 78.1 kg       Palliative Assessment/Data: PPS 40%    Flowsheet Rows    Flowsheet Row Most Recent Value  Intake Tab   Referral Department Hospitalist  Unit at Time of Referral Intermediate Care Unit  Palliative Care Primary Diagnosis Cancer  Date Notified 11/22/21  Palliative Care Type New Palliative care  Reason for referral Clarify Goals of Care  Date of Admission 11/10/21  Date first seen by Palliative Care 11/22/21  # of days Palliative referral response time 0 Day(s)  # of days IP prior to Palliative referral 12  Clinical Assessment   Psychosocial & Spiritual Assessment   Palliative Care Outcomes        Patient Active  Problem List   Diagnosis Date Noted   Edema due to hypoalbuminemia 11/22/2021   Leukemoid reaction 11/22/2021   Hepatitis C infection 11/22/2021   Cancer, metastatic to bone ([place]) 11/22/2021   Adenocarcinoma poorly differentiated, 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  11/22/2021   Frequent PVCs 11/22/2021   Elevated AST (SGOT) 11/22/2021   Protein-calorie malnutrition, severe 11/16/2021   Lytic bone lesions on xray 18/84/1660   Metabolic acidosis 63/07/6008   Hyperbilirubinemia 11/12/2021   Sinus tachycardia 11/12/2021   Loculated pleural effusion 11/11/2021   Liver lesion  11/11/2021   History of ischemic stroke 11/11/2021   History of tuberculosis 11/11/2021   Normocytic anemia 11/11/2021   Sepsis due to pneumonia ([place]) 11/10/2021   Chronic combined  systolic (congestive) and diastolic (congestive) heart failure ([place]) 11/10/2021   Sepsis ([employer] [employer]) 07/06/2017   Hypokalemia 07/06/2017   Hyponatremia 07/06/2017   Hypotension 07/06/2017   Hypertension    Tobacco abuse    AKI (acute kidney injury) [facility])     Palliative Care Assessment & Plan   HPI: 76 y.o. male  with past medical history of hypertension, CHF, TB in 2010, stroke, and tobacco dependence admitted on 11/10/2021 with shortness of breath and night sweats.  Found to have cavitary pneumonia.CT showing multiple liver lesions, bony lytic lesions, and bilateral lung nodules.  CT abdomen revealed pancreatic tail mass.  Liver biopsy revealed adenocarcinoma.  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  PMT consulted to discuss goals of care.  Assessment:  Meeting today scheduled by Dr. Posey Pronto with patient's sister, Blanch Media who has been named HCPOA and also his brother Quita Skye.  I introduced Palliative Medicine as specialized medical care for people living with serious illness. It focuses on providing relief from the symptoms and stress of a serious illness. The goal is to improve quality of life for both the patient and the family.  We discussed a brief life review of the patient.  Quita Skye and Blanch Media share patient is a "jack of all trades".  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  They tell me he is 1 of 14 siblings.  Most of his siblings live locally, 3 of them passed away.  They tell me at baseline patient was doing well and living independently.  They tell me his current state is quite shocking.   We discussed patient's current illness and what it means in the larger context of patient's on-going co-morbidities.  Natural disease trajectory and expectations at EOL were discussed.  We discussed his metastatic pancreatic cancer and limited options for treatment.  We discussed his poor appetite and poor nutrition.  I attempted to elicit values and goals of care important to the  patient.  Family reports patient just wants to be at home in his own bed.  They share he has a history of not wanting to pursue medical care.  The difference between aggressive medical intervention and comfort care was considered in light of the patient's goals of care.  Family agrees patient would want to focus on his comfort and being at home.  We reviewed that in previous conversation patient has elected DNR status.  Family shares he had shared that with them as well.  Hospice and Palliative Care services outpatient were explained and offered.  Family is interested in the support of hospice at home.  We discussed type of support provided by hospice and philosophy of hospice care.  They agree.  We discussed that if patient does feel better once he goes home  and decides he would like to follow-up within he could schedule an appointment.  Questions and concerns were addressed. The family was encouraged to call with questions or concerns.    Recommendations/Plan: Sister named as Economist today Most important goal to patient is to be at home, family would like the support of hospice for patient at home Family understands poor prognosis, it seems patient does as well I have notified [place] as well as Government social research officer of plan to discharge home with hospice services -likely DC tomorrow 5/20 -family requesting ambulance transfer home, social work aware  Code Status: DNR  Discharge Planning: Home with Hospice  Care plan was discussed with patient, sister, brother, RN, Education officer, museum, hospice liaison, Dr. Posey Pronto  Thank you for allowing the Palliative Medicine Team to assist in the care of this patient.   *Please note that this is a verbal dictation therefore any spelling or grammatical errors are due to the "[employer]" system interpretation.  Juel Burrow, DNP, [facility] Palliative Medicine Team Team Phone # [phone]  Pager [phone]

## 2021-11-24 NOTE — Progress Notes (Addendum)
Manufacturing engineer [facility]) Hospital Liaison Note  Referral received for patient/family interest in hospice at home. [place] liaison spoke with Blanch Media. Interest confirmed.   Hospice eligibility confirmed   Plan is to discharge home tomorrow via [place].   DME in the home: none  DME needs: none  Please send patient home with comfort prescriptions/medications at discharge.   Please call with any questions or concerns. Thank you  Roselee Nova, [place] Hospital Liaison [phone]

## 2021-11-24 NOTE — Progress Notes (Signed)
Progress Note Patient: Sean Farrell VCB:[id number] DOB: 16-May-1946 DOA: 11/10/2021  DOS: the patient was seen and examined on 11/24/2021  Brief hospital course: Sean Farrell is a 76 y.o. M with hx stroke, smoking, hx TB in 2010, and chronic heart failure EF 40-45% who presented with cough, SOB and night sweats for maybe a month. In the ER, CXR showed cavitary PNA, effusion. 5/5: Admitted on airborne precautions, chest tube placed, Pulm consulted, CT showing multiple liver lesions, bony lytic lesions, bilateral lung nodules, no obvious primary 5/6: Obtaining CT abd/pel --> shows pancreatic tail mass; tPA/dornase x1 5/7: IR consulted for liver mass biopsy; lytics second dose 5/10: To IR for liver US and biopsy vs drain placement 5/11: Persistent fever/leukocytosis 5/13: Repeat bcx 5/14: Discontinue antibiotics, persistent leukocytosis 5/15: ID consult. Liver biopsy: Adenocarcinoma, poorly differentiated 5/16: Oncology consult 5/19: Discussion with family and palliative care, plan is to go home with hospice.  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  Work-up shows possible pneumonia as well as loculated pleural effusion. PCCM was consulted for chest tube placement for loculated effusion. Treated with IV antibiotics for 7 days.  Work-up so far negative for infection. Patient had multiple nodules as well as liver lesions and bony metastatic lesions. Pleural fluid cytology was negative for any malignant cells. Still suspect that the loculated pleural effusion as well has lung consolidation most likely represent metastatic cancer. Liver biopsy performed by radiology. Work-up came back positive for adenocarcinoma. Oncology consulted.  Goals of care conversation. Palliative care by specialist. Patient continues to  have poor p.o. intake as well as poor functional status. Discussed in detail regarding his current condition and future prognosis with his malignancy. Patient wants to discuss this with his sister. Explained in detail that the patient is a prognosis of very poor given his present disease which most likely represent a stage IV aggressive malignancy. Patient's sister currently understand the patient's prognosis and agrees with hospice on discharge.   Loculated pleural effusion Sepsis due to pneumonia present on admission.   Treated with chest tube and antibiotics.   History of tuberculosis Evidence of prior current limitus disease.  AFB smear negative.  Out of airborne precaution.   Leukemoid reaction ID consulted to persistent leukocytosis despite antibiotic as well as fever. Appears to have leukemoid reaction in response to his malignancy. Currently monitored off of the antibiotics and so far has no fever. No further therapy for now.   History of hypertension Orthostatic hypotension Blood pressure soft.  Actually unable to complete orthostatic due to fatigue and dizziness. Continue IV fluids and monitor response. If no improvement may require IV albumin. Currently holding all antihypertensive regimen.   Chronic combined systolic (congestive) and diastolic (congestive) heart failure (HCC) Edema due to hypoalbuminemia Frequent PVCs Frequent PVCs seen on telemetry on 5/17. Prior history of cardiomyopathy. Appears to have some volume overload. Echocardiogram shows evidence of preserved EF, normal RV function no valvular abnormality.  Protein-calorie malnutrition, severe Patient with severe malnutrition in the setting of malignancy. Currently continuing with Marinol but plan is to go home with hospice.  Body mass index is 21.91 kg/m. Nutrition Problem: Severe Malnutrition Etiology: chronic illness (CHF, polysubstance abuse, cavitary lesion) Nutrition  Interventions: Interventions: Ensure Enlive (each supplement provides 350kcal and 20 grams of protein), Magic cup, MVI, Refer to RD note for recommendations    Hyperbilirubinemia Work-up  so far negative for any obstruction. AST continues to remain elevated.   Sinus tachycardia Likely from dehydration. Presuming hydration will monitor.   Metabolic acidosis From IV fluids with resultant hyperchloremia.   history of ischemic stroke No evidence of acute stroke. Monitor.   Hepatitis C infection ID recommend outpatient follow-up.   Tobacco abuse Smoking cessation recommended.   Normocytic anemia Hemoglobin stable for now.  Monitor.  Subjective: Poor p.o. intake.  No nausea no vomiting.  No fever no chills.  Blood pressure was severely low overnight.  Physical Exam: Vitals:   11/24/21 0814 11/24/21 0930 11/24/21 1100 11/24/21 1520  BP:  (!) 106/49 (!) 111/54 (!) 105/50  Pulse:  86 81 92  Resp:  (!) 26 (!) 24 (!) 28  Temp:   99.1 F (37.3 C) 100 F (37.8 C)  TempSrc:   Oral Oral  SpO2: 94% 95% 94% 92%  Weight:      Height:       General: Appear in mild distress; no visible Abnormal Neck Mass Or lumps, Conjunctiva normal Cardiovascular: S1 and S2 Present, no Murmur, Respiratory: good respiratory effort, Bilateral Air entry present and bilateral Crackles, no wheezes Abdomen: Bowel Sound present, Non tender  Extremities: bilateral  Pedal edema Neurology: alert and oriented to time, place, and person Gait not checked due to patient safety concerns   Data Reviewed: I have Reviewed nursing notes, Vitals, and Lab results since pt's last encounter. Pertinent lab results CBC, BMP I have discussed pt's care plan and test results with palliative care.   Family Communication: Sister at bedside  Disposition: Status is: Inpatient Remains inpatient appropriate because: Discharging home with hospice tomorrow once equipments are available.  Author: Berle Mull, MD 11/24/2021  7:23 PM  Please look on [email or web address] to find out who is on call.

## 2021-11-24 NOTE — Plan of Care (Signed)
  Problem: Respiratory: Goal: Ability to maintain a clear airway will improve Outcome: Progressing   Problem: Coping: Goal: Level of anxiety will decrease Outcome: Progressing   Problem: Elimination: Goal: Will not experience complications related to bowel motility Outcome: Progressing Goal: Will not experience complications related to urinary retention Outcome: Progressing   Problem: Pain Managment: Goal: General experience of comfort will improve Outcome: Progressing   Problem: Nutrition: Goal: Adequate nutrition will be maintained Outcome: Not Progressing

## 2021-11-24 NOTE — Plan of Care (Signed)

## 2021-11-24 NOTE — Progress Notes (Signed)
OT Cancellation Note  Patient Details Name: Sean Farrell MRN: [id number] DOB: 1945/10/20   Cancelled Treatment:    Reason Eval/Treat Not Completed: Other (comment).  Met with pt. For attempted skilled OT treatment session.  Provided multiple treatment options.  Pt. Declined every option citing that he was not ready to do anything.  Also stated he had an aide prior to admission that could assist him with self care and would have the same aide upon return home but also states he can do these tasks without the help.  Max encouragement to participate with explanation of benefits of daily participation but pt. Continued to decline all options.    Clearnce Sorrel Lorraine-COTA/L 11/24/2021, 8:29 AM

## 2021-11-24 NOTE — Progress Notes (Signed)
Nutrition Brief Note  Chart reviewed. Plan is for pt to d/c home with hospice services, likely tomorrow. No further nutrition interventions planned at this time.  Please re-consult as needed.    Gustavus Bryant, MS, RD, LDN Inpatient Clinical Dietitian Please see AMiON for contact information.

## 2021-11-24 NOTE — Progress Notes (Signed)
Patient BP soft after taking ambien and zanaflex. Patient GCS 15, room air, RASS -1 drowsy, able to communicate clearly. Repositioned patient in bed with HOB at 30 degrees. Patient has no complaints, denies dizziness. Assessment unchanged, pt warm and dry. Charge notified. BP set to auto check every 30 min. Will continue to follow closely and escalate as needed. Erling Conte, RN

## 2021-11-25 DIAGNOSIS — C801 Malignant (primary) neoplasm, unspecified: Secondary | ICD-10-CM | POA: Diagnosis not present

## 2021-11-25 LAB — CBC WITH DIFFERENTIAL/PLATELET
Abs Immature Granulocytes: 0.96 10*3/uL — ABNORMAL HIGH (ref 0.00–0.07)
Basophils Absolute: 0.2 10*3/uL — ABNORMAL HIGH (ref 0.0–0.1)
Basophils Relative: 0 %
Eosinophils Absolute: 0.2 10*3/uL (ref 0.0–0.5)
Eosinophils Relative: 0 %
HCT: 28.6 % — ABNORMAL LOW (ref 39.0–52.0)
Hemoglobin: 9.2 g/dL — ABNORMAL LOW (ref 13.0–17.0)
Immature Granulocytes: 2 %
Lymphocytes Relative: 5 %
Lymphs Abs: 1.9 10*3/uL (ref 0.7–4.0)
MCH: 28.7 pg (ref 26.0–34.0)
MCHC: 32.2 g/dL (ref 30.0–36.0)
MCV: 89.1 fL (ref 80.0–100.0)
Monocytes Absolute: 2.5 10*3/uL — ABNORMAL HIGH (ref 0.1–1.0)
Monocytes Relative: 6 %
Neutro Abs: 34.1 10*3/uL — ABNORMAL HIGH (ref 1.7–7.7)
Neutrophils Relative %: 87 %
Platelets: 391 10*3/uL (ref 150–400)
RBC: 3.21 MIL/uL — ABNORMAL LOW (ref 4.22–5.81)
RDW: 15.2 % (ref 11.5–15.5)
WBC: 39.7 10*3/uL — ABNORMAL HIGH (ref 4.0–10.5)
nRBC: 0 % (ref 0.0–0.2)

## 2021-11-25 LAB — BASIC METABOLIC PANEL
Anion gap: 7 (ref 5–15)
BUN: 13 mg/dL (ref 8–23)
CO2: 20 mmol/L — ABNORMAL LOW (ref 22–32)
Calcium: 9.1 mg/dL (ref 8.9–10.3)
Chloride: 106 mmol/L (ref 98–111)
Creatinine, Ser: 0.78 mg/dL (ref 0.61–1.24)
GFR, Estimated: >60 mL/min (ref >60)
Glucose, Bld: 143 mg/dL — ABNORMAL HIGH (ref 70–99)
Potassium: 3.7 mmol/L (ref 3.5–5.1)
Sodium: 133 mmol/L — ABNORMAL LOW (ref 135–145)

## 2021-11-25 LAB — MAGNESIUM: Magnesium: 1.9 mg/dL (ref 1.7–2.4)

## 2021-11-25 MED ORDER — GUAIFENESIN ER 600 MG PO TB12
600.0000 mg | ORAL_TABLET | Freq: Two times a day (BID) | ORAL | 0 refills | Status: AC | PRN
Start: 2021-11-25 — End: ?

## 2021-11-25 MED ORDER — BISACODYL 5 MG PO TBEC: 5.0000 mg | DELAYED_RELEASE_TABLET | Freq: Every day | ORAL | 0 refills | Status: AC | PRN

## 2021-11-25 MED ORDER — POLYETHYLENE GLYCOL 3350 17 G PO PACK: 17.0000 g | PACK | Freq: Every day | ORAL | 0 refills | Status: AC | PRN

## 2021-11-25 MED ORDER — LORAZEPAM 0.5 MG PO TABS: 0.5000 mg | ORAL_TABLET | ORAL | 0 refills | Status: AC | PRN

## 2021-11-25 MED ORDER — NICOTINE 14 MG/24HR TD PT24: 14.0000 mg | MEDICATED_PATCH | Freq: Every day | TRANSDERMAL | 0 refills | Status: AC

## 2021-11-25 MED ORDER — DRONABINOL 5 MG PO CAPS: 5.0000 mg | ORAL_CAPSULE | Freq: Two times a day (BID) | ORAL | 0 refills | Status: AC

## 2021-11-25 MED ORDER — ENSURE ENLIVE PO LIQD: 237.0000 mL | Freq: Three times a day (TID) | ORAL | 0 refills | Status: AC

## 2021-11-25 MED ORDER — MORPHINE SULFATE (CONCENTRATE) 20 MG/ML PO SOLN: 5.0000 mg | ORAL | 0 refills | Status: AC | PRN

## 2021-11-25 NOTE — Progress Notes (Signed)
Pt left with all of his belongings with PTAR,  PTAR refused to take wheelchair and walker from room, RN called to sister Blanch Media and asked them to pick up and they agreed. Pt discharged home with hospice.  Palma Holter, RN

## 2021-11-25 NOTE — TOC Transition Note (Signed)
Transition of Care [facility]) - CM/SW Discharge Note   Patient Details  Name: AMRITPAL SHROPSHIRE MRN: [id number] Date of Birth: 31-May-1946  Transition of Care [facility]) CM/SW Contact:  Gayla Medicus., RN Phone Number: 11/25/2021, 1:21 PM  Clinical Narrative:     ODEN LINDAMAN is a 76 y.o. male with medical history significant of HTN, chronic combined CHF, and tobacco dependence presenting with SOB.   PTAR transportation arranged for patient-all information on front of chart.

## 2021-11-25 NOTE — Discharge Summary (Signed)
Physician Discharge Summary   Patient: Sean Farrell MRN: [id number] DOB: Nov 20, 1945  Admit date:     11/10/2021  Discharge date: 11/25/21  Discharge Physician: Berle Mull  PCP: Nolene Ebbs, MD  Recommendations at discharge: Establish care with hospice on discharge. 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  Active Problems:   Loculated pleural effusion   Liver lesion    Lytic bone lesions on xray   Cancer, metastatic to bone (HCC)   Sepsis due to pneumonia ([place])   History of tuberculosis   Leukemoid reaction   Hypertension   Hypotension   Chronic combined systolic (congestive) and diastolic (congestive) heart failure (HCC)   Edema due to hypoalbuminemia   Frequent PVCs   Protein-calorie malnutrition, severe   Hyponatremia   Hyperbilirubinemia   Sinus tachycardia   Elevated AST (SGOT)   Metabolic acidosis   History of ischemic stroke   Hepatitis C infection   Tobacco abuse   Normocytic anemia   Palliative care by specialist   Hospital Course: Sean Farrell is a 76 y.o. M with hx stroke, smoking, hx TB in 2010, and chronic heart failure EF 40-45% who presented with cough, SOB and night sweats for maybe a month. In the ER, CXR showed cavitary PNA, effusion. 5/5: Admitted on airborne precautions, chest tube placed, Pulm consulted, CT showing multiple liver lesions, bony lytic lesions, bilateral lung nodules, no obvious primary 5/6: Obtaining CT abd/pel --> shows pancreatic tail mass; tPA/dornase x1 5/7: IR consulted for liver mass biopsy; lytics second dose 5/10: To IR for liver US and biopsy vs drain placement 5/11: Persistent fever/leukocytosis 5/13: Repeat bcx 5/14: Discontinue antibiotics, persistent leukocytosis 5/15: ID consult. Liver biopsy: Adenocarcinoma, poorly differentiated 5/16: Oncology consult 5/19: Discussion with family and palliative care, plan is to go home with  hospice.  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  Work-up shows possible pneumonia as well as loculated pleural effusion. PCCM was consulted for chest tube placement for loculated effusion. Treated with IV antibiotics for 7 days.  Work-up so far negative for infection. Patient had multiple nodules as well as liver lesions and bony metastatic lesions. Pleural fluid cytology was negative for any malignant cells. Still suspect that the loculated pleural effusion as well has lung consolidation most likely represent metastatic cancer. Liver biopsy performed by radiology. Work-up came back positive for adenocarcinoma. Oncology consulted.   Goals of care conversation. Palliative care by specialist. Patient continues to have poor p.o. intake as well as poor functional status. Discussed in detail regarding his current condition and future prognosis with his malignancy. Patient wants to discuss this with his sister. Explained in detail that the patient is a prognosis of very poor given his present disease which most likely represent a stage IV aggressive malignancy. Patient's sister currently understand the patient's prognosis and agrees with hospice on discharge.   Loculated pleural effusion Sepsis due to pneumonia present on admission.   Treated with chest tube and antibiotics.   History of tuberculosis Evidence of prior current limitus disease.  AFB smear negative.  Out of airborne precaution.   Leukemoid reaction ID consulted to persistent leukocytosis despite antibiotic as well as fever. Appears to have leukemoid reaction in response to his malignancy. Currently monitored off of the antibiotics and so far has no fever. No further therapy for now.  History of hypertension Orthostatic hypotension Blood  pressure soft.   Currently holding all antihypertensive regimen.   Chronic combined systolic (congestive) and diastolic (congestive) heart failure (HCC) Edema due to hypoalbuminemia Frequent PVCs Frequent PVCs seen on telemetry on 5/17. Prior history of cardiomyopathy. Appears to have some third spacing from hypoalbuminemia. Echocardiogram shows evidence of preserved EF, normal RV function no valvular abnormality.  Protein-calorie malnutrition, severe Body mass index is 24.09 kg/m.  Patient with severe malnutrition in the setting of malignancy. Currently continuing with Marinol but plan is to go home with hospice. Nutrition Problem: Severe Malnutrition Etiology: chronic illness (CHF, polysubstance abuse, cavitary lesion) Nutrition Interventions: Interventions: Ensure Enlive (each supplement provides 350kcal and 20 grams of protein), Magic cup, MVI, Refer to RD note for recommendations    Hyperbilirubinemia Work-up so far negative for any obstruction. AST continues to remain elevated.   Sinus tachycardia Likely from dehydration.   Metabolic acidosis From IV fluids with resultant hyperchloremia.   history of ischemic stroke No evidence of acute stroke.   Hepatitis C infection ID recommend outpatient follow-up. Now hospice   Tobacco abuse Smoking cessation recommended.   Normocytic anemia Hemoglobin stable for now.   Pain control - [employer] Controlled Substance Reporting System database was reviewed. Hospice pt   Consultants: Palliative care Oncology IR ID PCCM  Procedures performed:  Chest tube insertion Pleural fibrinolytic administration x2 Ultrasound-guided biopsy of the liver DISCHARGE MEDICATION: Allergies as of 11/25/2021   No Known Allergies      Medication List     STOP taking these medications    amLODipine 5 MG tablet Commonly known as: NORVASC   diclofenac 75 MG EC tablet Commonly known as: VOLTAREN   metoprolol tartrate 25 MG  tablet Commonly known as: LOPRESSOR   tiZANidine 4 MG tablet Commonly known as: ZANAFLEX   zolpidem 5 MG tablet Commonly known as: AMBIEN       TAKE these medications    bisacodyl 5 MG EC tablet Commonly known as: DULCOLAX Take 1 tablet (5 mg total) by mouth daily as needed for moderate constipation.   dronabinol 5 MG capsule Commonly known as: MARINOL Take 1 capsule (5 mg total) by mouth 2 (two) times daily before lunch and supper.   feeding supplement Liqd Take 237 mLs by mouth 3 (three) times daily between meals.   guaiFENesin 600 MG 12 hr tablet Commonly known as: MUCINEX Take 1 tablet (600 mg total) by mouth 2 (two) times daily as needed for cough.   LORazepam 0.5 MG tablet Commonly known as: Ativan Take 1 tablet (0.5 mg total) by mouth every 4 (four) hours as needed for anxiety or sleep.   morphine 20 MG/ML concentrated solution Commonly known as: ROXANOL Take 0.25-0.5 mLs (5-10 mg total) by mouth every 2 (two) hours as needed for severe pain, moderate pain or shortness of breath.   nicotine 14 mg/24hr patch Commonly known as: NICODERM CQ - dosed in mg/24 hours Place 1 patch (14 mg total) onto the skin daily. Start taking on: Nov 26, 2021   polyethylene glycol 17 g packet Commonly known as: MIRALAX / GLYCOLAX Take 17 g by mouth daily as needed for mild constipation.   Spiriva HandiHaler 18 MCG inhalation capsule Generic drug: tiotropium Place 1 capsule into inhaler and inhale daily.               Durable Medical Equipment  (From admission, onward)           Start  Ordered   11/23/21 1428  For home use only DME lightweight manual wheelchair with seat cushion  Once       Comments: Patient suffers from weakness which impairs their ability to perform daily activities like bathing, dressing, feeding, grooming, and toileting in the home.  A cane, crutch, or walker will not resolve  issue with performing activities of daily living. A wheelchair  will allow patient to safely perform daily activities. Patient is not able to propel themselves in the home using a standard weight wheelchair due to endurance and general weakness. Patient can self propel in the lightweight wheelchair. Length of need 12 months . Accessories: elevating leg rests (ELRs), wheel locks, extensions and anti-tippers.   11/23/21 1428   11/21/21 1008  For home use only DME Walker rolling  Once       Question Answer Comment  Walker: With 5 Inch Wheels   Patient needs a walker to treat with the following condition Weakness      11/21/21 1008            Follow-up Information     [place] Follow up.   Why: Your home Health has been set up with [place]. The office will call you with start of service. If you have any questions please call the number listed above Contact information: [address] [place] [address] [phone]               Disposition: Home Diet recommendation: Regular diet  Discharge Exam: Vitals:   11/25/21 0455 11/25/21 0735 11/25/21 0846 11/25/21 1118  BP:  115/61  138/70  Pulse:  90  98  Resp:  20  (!) 22  Temp:  98.8 F (37.1 C)  98.3 F (36.8 C)  TempSrc:  Oral  Oral  SpO2:  93% 94% 95%  Weight: 85.1 kg     Height:       General: Appear in mild distress; no visible Abnormal Neck Mass Or lumps, Conjunctiva normal Cardiovascular: S1 and S2 Present, no Murmur, Respiratory: good respiratory effort, Bilateral Air entry present and CTA, no Crackles, no wheezes Abdomen: Bowel Sound present, Non tender  Extremities: Bilateral left more than right pedal edema Neurology: alert and oriented to time, place, and person Gait not checked due to patient safety concerns Filed Weights   11/24/21 0427 11/24/21 0600 11/25/21 0455  Weight: 78.8 kg 78.1 kg 85.1 kg   Condition at discharge: stable  The results of significant diagnostics from this hospitalization (including imaging, microbiology,  ancillary and laboratory) are listed below for reference.   Imaging Studies: DG Chest 2 View  Result Date: 11/18/2021 CLINICAL DATA:  Loculated pleural effusion, fever, shortness of breath EXAM: CHEST - 2 VIEW COMPARISON:  11/15/2021 FINDINGS: Interval removal of a previously seen pigtail chest tube about the left lung base. Otherwise unchanged AP portable examination with diffuse bilateral interstitial pulmonary opacity and loculated bilateral pleural effusions. No new airspace opacity. Heart and mediastinum are unremarkable. IMPRESSION: 1. Interval removal of a previously seen pigtail chest tube about the left lung base. No significant pneumothorax. 2. Otherwise unchanged AP portable examination with diffuse bilateral interstitial pulmonary opacity and loculated bilateral pleural effusions. No new airspace opacity. Electronically Signed   By: Delanna Ahmadi M.D.   On: 11/18/2021 12:22   DG Chest 2 View  Result Date: 11/10/2021 CLINICAL DATA:  76 year old male with shortness of breath. Dehydration. EXAM: CHEST - 2 VIEW COMPARISON:  Chest radiographs 09/05/2016 and earlier.  FINDINGS: Abnormal left lung base in 2018 with mild patchy opacity at that time. But now a moderate sized area of confluent opacity throughout the left mid and lower lung most resembles a loculated pleural effusion. Only a small volume of aerated left lung base. Chronic elevation of the left hemidiaphragm. Small contralateral right pleural effusion. Some pleural fluid tracking in the fissures on the lateral view. No pneumothorax or pulmonary edema. Mediastinal contours which are not obscured appear stable and within normal limits. No acute osseous abnormality identified. Negative visible bowel gas. IMPRESSION: 1. Progressed abnormal left lung base since 2018 radiographs, felt at least in part related to pleural effusion with loculation. But otherwise indeterminate and recommend Chest CT with IV contrast to further characterize. 2. Small  right pleural effusion is also new. Electronically Signed   By: Genevie Ann M.D.   On: 11/10/2021 09:08   CT Chest W Contrast  Result Date: 11/10/2021 CLINICAL DATA:  Shortness of breath and back pain.  History of TB. EXAM: CT CHEST WITH CONTRAST TECHNIQUE: Multidetector CT imaging of the chest was performed during intravenous contrast administration. RADIATION DOSE REDUCTION: This exam was performed according to the departmental dose-optimization program which includes automated exposure control, adjustment of the mA and/or kV according to patient size and/or use of iterative reconstruction technique. CONTRAST:  85m OMNIPAQUE IOHEXOL 300 MG/ML  SOLN COMPARISON:  Chest x-ray from same day. CT chest dated September 17, 2008. FINDINGS: Cardiovascular: No significant vascular findings. Normal heart size. No pericardial effusion. No thoracic aortic aneurysm or dissection. Coronary, aortic arch, and branch vessel atherosclerotic vascular disease. No central pulmonary embolism. Mediastinum/Nodes: No enlarged mediastinal, hilar, or axillary lymph nodes. Thyroid gland, trachea, and esophagus demonstrate no significant findings. Lungs/Pleura: Moderate loculated left pleural effusion with partial collapse of the lingula and left lower lobe. Small right pleural effusion. Several small solid pulmonary nodules in both lungs measuring up to 5 mm nodule in the left upper lobe (series 3, image 40). Nodular scarring and calcification in the right upper and lower lobes. Mild centrilobular and paraseptal emphysema. No consolidation or pneumothorax. Upper Abdomen: Multiple centrally hypodense heterogeneously enhancing liver lesions measuring up to 6.9 cm Musculoskeletal: Multiple small lytic lesions scattered throughout the thoracic spine and bilateral ribs, new since 2010. IMPRESSION: 1. Constellation of findings consistent with metastatic disease including multiple liver lesions and multiple bony lytic lesions in the thoracic spine and  bilateral ribs. No definite primary lung malignancy. Multiple small solid noncalcified pulmonary nodules in both lungs measuring up to 5 mm are indeterminate, but could represent metastatic disease as well. 2. Moderate loculated left pleural effusion with partial collapse of the lingula and left lower lobe. Small right pleural effusion. 3. Sequelae of prior granulomatous disease in the right lung. 4. Aortic Atherosclerosis (ICD10-I70.0) and Emphysema (ICD10-J43.9). Electronically Signed   By: WTitus DubinM.D.   On: 11/10/2021 12:02   CT ABDOMEN PELVIS W CONTRAST  Result Date: 11/11/2021 CLINICAL DATA:  Metastatic disease evaluation; * Tracking Code: BO * EXAM: CT ABDOMEN AND PELVIS WITH CONTRAST TECHNIQUE: Multidetector CT imaging of the abdomen and pelvis was performed using the standard protocol following bolus administration of intravenous contrast. RADIATION DOSE REDUCTION: This exam was performed according to the departmental dose-optimization program which includes automated exposure control, adjustment of the mA and/or kV according to patient size and/or use of iterative reconstruction technique. CONTRAST:  1013mOMNIPAQUE IOHEXOL 300 MG/ML  SOLN COMPARISON:  None Available. FINDINGS: Lower chest: Small bilateral loculated pleural effusions with  left greater than right pleural nodularity, left-sided chest tube in place. Hepatobiliary: Ill-defined liver lesions. Reference lesion located in the central liver measuring 5.5 x 3.6 cm on series 3, image 33. Reference lesion of the right hepatic dome measuring 3.4 x 3.6 cm on series 3, image 19. Pancreas: No pancreatic duct dilation. Ill-defined lesion of the pancreatic tail measuring 2.0 x 1.5 cm on series 3, image 27. Spleen: Normal in size without focal abnormality. Adrenals/Urinary Tract: Bilateral adrenal glands are unremarkable. No hydronephrosis or nephrolithiasis. Bladder is unremarkable. Stomach/Bowel: Stomach is within normal limits. Appendix  appears normal. No evidence of bowel wall thickening, distention, or inflammatory changes. Vascular/Lymphatic: Aortic atherosclerosis. Enlarged periportal lymph node measuring 1.1 cm in short axis on series 3, image 37. Reproductive: Mild prostatomegaly. Other: No abdominal wall hernia or abnormality. No abdominopelvic ascites. Musculoskeletal: Lytic lesion of the lateral left 9th rib with soft tissue component and S1 lytic lesion. IMPRESSION: 1. Small bilateral loculated pleural effusions with left greater than right pleural nodularity, compatible with pleural metastatic disease. Left-sided chest tube in place. 2. Ill-defined liver lesions, compatible with hepatic metastatic disease. 3. Ill-defined lesion of the tail of the pancreas, possibly due to primary malignancy or additional site of metastatic disease. Contrast-enhanced liver MRI could assist with further characterization. 4. Enlarged periportal lymph node. 5. Lytic lesion of the lateral left 9th rib with soft tissue component and S1 lytic lesion, compatible with osseous metastatic disease. Electronically Signed   By: Yetta Glassman M.D.   On: 11/11/2021 17:56   US BIOPSY (LIVER)  Result Date: 11/15/2021 INDICATION: Multiple liver lesions. EXAM: ULTRASOUND GUIDED CORE BIOPSY OF LIVER MEDICATIONS: None. ANESTHESIA/SEDATION: Moderate (conscious) sedation was employed during this procedure. A total of Versed 1.5 mg and Fentanyl 50 mcg was administered intravenously by radiology nursing. Moderate Sedation Time: 14 minutes. The patient's level of consciousness and vital signs were monitored continuously by radiology nursing throughout the procedure under my direct supervision. PROCEDURE: The procedure, risks, benefits, and alternatives were explained to the patient. Questions regarding the procedure were encouraged and answered. The patient understands and consents to the procedure. A time-out was performed prior to initiating the procedure. Ultrasound was  performed of the liver. The abdominal wall was prepped with chlorhexidine in a sterile fashion, and a sterile drape was applied covering the operative field. A sterile gown and sterile gloves were used for the procedure. Local anesthesia was provided with 1% Lidocaine. A 17 gauge trocar needle was advanced into the left lobe of the liver. Attempted aspiration was performed. After needle positioning, coaxial 18 gauge core biopsy samples were obtained. Two samples were submitted in formalin for histologic analysis and a third in saline for tissue culture studies. COMPLICATIONS: None immediate. FINDINGS: Multiple ill-defined lesions are identified by ultrasound throughout the liver which appears solid. The largest is within the medial left lobe and measures up to nearly 9 cm in estimated maximal diameter. Initial attempted aspiration yielded no fluid. Core biopsy yielded solid tissue. IMPRESSION: No evidence of liquefied abscess in the liver by ultrasound. Lesions appears solid and core biopsy yielded solid tissue. Two samples were sent for histologic analysis and a third for tissue culture studies. Electronically Signed   By: Aletta Edouard M.D.   On: 11/15/2021 13:43   DG CHEST PORT 1 VIEW  Result Date: 11/15/2021 CLINICAL DATA:  Follow-up pleural effusion. EXAM: PORTABLE CHEST 1 VIEW COMPARISON:  Multiple previous chest x-rays and recent chest CT. FINDINGS: The left basilar pleural drainage catheter is stable.  A small residual pleural fluid collection is noted laterally. Persistent left lower lobe atelectasis. No pneumothorax. Persistent loculated appearing right pleural fluid collection with streaky right lung atelectasis. IMPRESSION: Stable left basilar pleural drainage catheter with a small residual pleural fluid collection laterally. Persistent loculated right pleural fluid collection. Electronically Signed   By: Marijo Sanes M.D.   On: 11/15/2021 08:12   DG CHEST PORT 1 VIEW  Result Date:  11/14/2021 CLINICAL DATA:  Pleural effusion on the left. EXAM: PORTABLE CHEST 1 VIEW COMPARISON:  Nov 13, 2021 chest x-ray. FINDINGS: Similar appearance of a small left pleural effusion with probable kinking of the left chest tube. No visible pneumothorax. Biapical pleuroparenchymal scarring. Similar bilateral social thickening. Similar cardiomediastinal silhouette. IMPRESSION: Similar appearance of a small left pleural effusion with probable kinking of the left chest tube. Recommend correlation with chest tube function. Electronically Signed   By: Margaretha Sheffield M.D.   On: 11/14/2021 11:09   DG Chest Port 1 View  Result Date: 11/13/2021 CLINICAL DATA:  Left pleural effusion EXAM: PORTABLE CHEST 1 VIEW COMPARISON:  11/12/2021 FINDINGS: Small left pleural effusion with a left-sided chest tube. Chest tube as it enters the pleural space appears kinked. Bilateral interstitial thickening. Trace right pleural effusion. No pneumothorax. Stable cardiomediastinal silhouette. No acute osseous abnormality. IMPRESSION: 1. Small left pleural effusion with a left-sided chest tube. Chest tube as it enters the pleural space appears kinked. Electronically Signed   By: Kathreen Devoid M.D.   On: 11/13/2021 08:13   DG CHEST PORT 1 VIEW  Result Date: 11/12/2021 CLINICAL DATA:  Left chest tube. EXAM: PORTABLE CHEST 1 VIEW COMPARISON:  Chest CT 11/10/2021, portable chest 11/11/2021 at 6:28 a.m. FINDINGS: 5:29 a.m., 11/12/2021. Pigtail tube thoracostomy in the lower left chest is in similar position, but today the tube appears to be kinked probably at the skin insertion. Loculated lateral and medial basal moderate-sized left pleural collections are slightly smaller today with patchy aeration in the intervening and overlying lung which could be atelectasis or consolidation. Small right pleural effusion is unchanged with associated haziness in the lateral right lower lung field which could be consolidation or atelectasis. The upper lung  fields remain generally clear with COPD. There is no pneumothorax. The cardiac size is normal. Osteopenia. IMPRESSION: The pigtail left basal chest tube appears to be kinked probably at the skin insertion compared with yesterday's exam but it is in similar position. Loculated fluid in the medial and lateral left chest base is slightly smaller today with patchy aeration in the adjacent lung which could be atelectasis or pneumonitis. Stable aeration on the right with small pleural effusion and haziness in the lateral lower lung field. Electronically Signed   By: Telford Nab M.D.   On: 11/12/2021 07:12   DG CHEST PORT 1 VIEW  Result Date: 11/11/2021 CLINICAL DATA:  76 year old male with history of pleural effusion. EXAM: PORTABLE CHEST 1 VIEW COMPARISON:  Chest x-ray 11/10/2021. FINDINGS: Small bore chest tube in position with pigtail reformed over the lower aspect of the left hemithorax, similarly positioned. There continues to be loculated pleural fluid in the mid to lower left hemithorax, most notably laterally, unchanged. Small right pleural effusion increased compared to the prior study. Increasing peripheral opacity in the right mid to lower lung. No pneumothorax. No evidence of pulmonary edema. Heart size is normal. Upper mediastinal contours are within normal limits. IMPRESSION: 1. Support apparatus, as above. 2. Worsening airspace consolidation in the periphery of the right mid to lower  lung with increasing small right parapneumonic pleural effusion. 3. Persistent loculated left pleural fluid collection, stable. Electronically Signed   By: Vinnie Langton M.D.   On: 11/11/2021 06:46   DG CHEST PORT 1 VIEW  Result Date: 11/10/2021 CLINICAL DATA:  Recurrent pneumonia. Loculated left pleural effusion. Chest tube drainage. EXAM: PORTABLE CHEST 1 VIEW COMPARISON:  Prior today FINDINGS: There has been placement of a left pleural pigtail catheter in the retrocardiac region. Decreased pleural fluid seen at  the left lung base with a small medial left basilar pneumothorax. Loculated component pleural effusion along the left lateral chest wall is unchanged. Chronic pulmonary interstitial prominence again noted. Heart size is within normal limits. IMPRESSION: Decreased left pleural effusion following left pleural pigtail catheter placement. Small medial left basilar pneumothorax noted. No change in loculated portion of pleural effusion along left lateral chest wall. Electronically Signed   By: Marlaine Hind M.D.   On: 11/10/2021 15:12   ECHOCARDIOGRAM COMPLETE  Result Date: 11/22/2021    ECHOCARDIOGRAM REPORT   Patient Name:   Sean Farrell Date of Exam: 11/22/2021 Medical Rec #:  [id number]        Height:       74.0 in Accession #:    [id number]       Weight:       170.6 lb Date of Birth:  1946/02/26        BSA:          2.032 m Patient Age:    46 years         BP:           119/61 mmHg Patient Gender: M                HR:           95 bpm. Exam Location:  Inpatient Procedure: 2D Echo, Cardiac Doppler and Color Doppler Indications:    Cardiomyopathy  History:        Patient has prior history of Echocardiogram examinations, most                 recent 07/08/2017.  Sonographer:    Jefferey Pica Referring Phys: [id number] [place]  1. Left ventricular ejection fraction, by estimation, is 60 to 65%. The left ventricle has normal function. The left ventricle has no regional wall motion abnormalities. There is mild concentric left ventricular hypertrophy. Left ventricular diastolic function could not be evaluated.  2. Right ventricular systolic function is normal. The right ventricular size is normal. There is normal pulmonary artery systolic pressure.  3. The mitral valve is normal in structure. No evidence of mitral valve regurgitation. No evidence of mitral stenosis.  4. The aortic valve is tricuspid. There is mild calcification of the aortic valve. Aortic valve regurgitation is not visualized. No  aortic stenosis is present.  5. The inferior vena cava is normal in size with greater than 50% respiratory variability, suggesting right atrial pressure of 3 mmHg. Comparison(s): Changes from prior study are noted. EF improved compared to prior. Conclusion(s)/Recommendation(s): Normal biventricular function without evidence of hemodynamically significant valvular heart disease. FINDINGS  Left Ventricle: Left ventricular ejection fraction, by estimation, is 60 to 65%. The left ventricle has normal function. The left ventricle has no regional wall motion abnormalities. The left ventricular internal cavity size was normal in size. There is  mild concentric left ventricular hypertrophy. Left ventricular diastolic function could not be evaluated due to nondiagnostic images. Left ventricular diastolic function could  not be evaluated. Right Ventricle: The right ventricular size is normal. No increase in right ventricular wall thickness. Right ventricular systolic function is normal. There is normal pulmonary artery systolic pressure. The tricuspid regurgitant velocity is 2.74 m/s, and  with an assumed right atrial pressure of 3 mmHg, the estimated right ventricular systolic pressure is 22.6 mmHg. Left Atrium: Left atrial size was normal in size. Right Atrium: Right atrial size was normal in size. Pericardium: There is no evidence of pericardial effusion. Mitral Valve: The mitral valve is normal in structure. No evidence of mitral valve regurgitation. No evidence of mitral valve stenosis. Tricuspid Valve: The tricuspid valve is normal in structure. Tricuspid valve regurgitation is mild . No evidence of tricuspid stenosis. Aortic Valve: The aortic valve is tricuspid. There is mild calcification of the aortic valve. Aortic valve regurgitation is not visualized. No aortic stenosis is present. Aortic valve peak gradient measures 5.8 mmHg. Pulmonic Valve: The pulmonic valve was grossly normal. Pulmonic valve regurgitation is  trivial. No evidence of pulmonic stenosis. Aorta: The aortic root, ascending aorta, aortic arch and descending aorta are all structurally normal, with no evidence of dilitation or obstruction. Venous: The inferior vena cava is normal in size with greater than 50% respiratory variability, suggesting right atrial pressure of 3 mmHg. IAS/Shunts: The atrial septum is grossly normal.  LEFT VENTRICLE PLAX 2D LVIDd:         2.90 cm   Diastology LVIDs:         1.70 cm   LV e' medial:  5.85 cm/s LV PW:         1.20 cm   LV e' lateral: 8.49 cm/s LV IVS:        1.20 cm LVOT diam:     2.10 cm LV SV:         64 LV SV Index:   32 LVOT Area:     3.46 cm  RIGHT VENTRICLE RV S prime:     13.00 cm/s TAPSE (M-mode): 2.4 cm LEFT ATRIUM           Index       RIGHT ATRIUM           Index LA diam:      2.30 cm 1.13 cm/m  RA Area:     14.60 cm LA Vol (A2C): 15.3 ml 7.53 ml/m  RA Volume:   38.70 ml  19.05 ml/m  AORTIC VALVE                 PULMONIC VALVE AV Area (Vmax): 3.05 cm     PV Vmax:       1.03 m/s AV Vmax:        120.50 cm/s  PV Peak grad:  4.2 mmHg AV Peak Grad:   5.8 mmHg LVOT Vmax:      106.00 cm/s LVOT Vmean:     68.900 cm/s LVOT VTI:       0.186 m  AORTA Ao Root diam: 3.50 cm Ao Asc diam:  3.40 cm TRICUSPID VALVE TR Peak grad:   30.0 mmHg TR Vmax:        274.00 cm/s  SHUNTS Systemic VTI:  0.19 m Systemic Diam: 2.10 cm Buford Dresser MD Electronically signed by Buford Dresser MD Signature Date/Time: 11/22/2021/7:13:13 PM    Final     Microbiology: Results for orders placed or performed during the hospital encounter of 11/10/21  Resp Panel by RT-PCR (Flu A&B, Covid) Nasopharyngeal Swab     Status: None  Collection Time: 11/10/21 10:02 AM   Specimen: Nasopharyngeal Swab; Nasopharyngeal(NP) swabs in vial transport medium  Result Value Ref Range Status   SARS Coronavirus 2 by RT PCR NEGATIVE NEGATIVE Final    Comment: (NOTE) SARS-CoV-2 target nucleic acids are NOT DETECTED.  The SARS-CoV-2 RNA is  generally detectable in upper respiratory specimens during the acute phase of infection. The lowest concentration of SARS-CoV-2 viral copies this assay can detect is 138 copies/mL. A negative result does not preclude SARS-Cov-2 infection and should not be used as the sole basis for treatment or other patient management decisions. A negative result may occur with  improper specimen collection/handling, submission of specimen other than nasopharyngeal swab, presence of viral mutation(s) within the areas targeted by this assay, and inadequate number of viral copies(<138 copies/mL). A negative result must be combined with clinical observations, patient history, and epidemiological information. The expected result is Negative.  Fact Sheet for Patients:  EntrepreneurPulse.com.au  Fact Sheet for Healthcare Providers:  IncredibleEmployment.be  This test is no t yet approved or cleared by the [place] FDA and  has been authorized for detection and/or diagnosis of SARS-CoV-2 by FDA under an Emergency Use Authorization (EUA). This EUA will remain  in effect (meaning this test can be used) for the duration of the COVID-19 declaration under Section 564(b)(1) of the Act, 21 U.S.C.section 360bbb-3(b)(1), unless the authorization is terminated  or revoked sooner.       Influenza A by PCR NEGATIVE NEGATIVE Final   Influenza B by PCR NEGATIVE NEGATIVE Final    Comment: (NOTE) The Xpert Xpress SARS-CoV-2/FLU/RSV plus assay is intended as an aid in the diagnosis of influenza from Nasopharyngeal swab specimens and should not be used as a sole basis for treatment. Nasal washings and aspirates are unacceptable for Xpert Xpress SARS-CoV-2/FLU/RSV testing.  Fact Sheet for Patients: EntrepreneurPulse.com.au  Fact Sheet for Healthcare Providers: IncredibleEmployment.be  This test is not yet approved or cleared by the [place] FDA and has been authorized for detection and/or diagnosis of SARS-CoV-2 by FDA under an Emergency Use Authorization (EUA). This EUA will remain in effect (meaning this test can be used) for the duration of the COVID-19 declaration under Section 564(b)(1) of the Act, 21 U.S.C. section 360bbb-3(b)(1), unless the authorization is terminated or revoked.  Performed at [facility], [place] [address], [place], [place] [address]   Respiratory (~20 pathogens) panel by PCR     Status: None   Collection Time: 11/10/21 10:02 AM   Specimen: Nasopharyngeal Swab; Respiratory  Result Value Ref Range Status   Adenovirus NOT DETECTED NOT DETECTED Final   Coronavirus 229E NOT DETECTED NOT DETECTED Final    Comment: (NOTE) The Coronavirus on the Respiratory Panel, DOES NOT test for the novel  Coronavirus (2019 nCoV)    Coronavirus HKU1 NOT DETECTED NOT DETECTED Final   Coronavirus NL63 NOT DETECTED NOT DETECTED Final   Coronavirus OC43 NOT DETECTED NOT DETECTED Final   Metapneumovirus NOT DETECTED NOT DETECTED Final   Rhinovirus / Enterovirus NOT DETECTED NOT DETECTED Final   Influenza A NOT DETECTED NOT DETECTED Final   Influenza B NOT DETECTED NOT DETECTED Final   Parainfluenza Virus 1 NOT DETECTED NOT DETECTED Final   Parainfluenza Virus 2 NOT DETECTED NOT DETECTED Final   Parainfluenza Virus 3 NOT DETECTED NOT DETECTED Final   Parainfluenza Virus 4 NOT DETECTED NOT DETECTED Final   Respiratory Syncytial Virus NOT DETECTED NOT DETECTED Final   Bordetella pertussis NOT DETECTED NOT DETECTED  Final   Bordetella Parapertussis NOT DETECTED NOT DETECTED Final   Chlamydophila pneumoniae NOT DETECTED NOT DETECTED Final   Mycoplasma pneumoniae NOT DETECTED NOT DETECTED Final    Comment: Performed at [facility], [place] [address], [place], [place] [address]  Blood culture (routine x 2)     Status: None   Collection Time: 11/10/21 10:03 AM   Specimen: BLOOD  Result Value Ref Range  Status   Specimen Description BLOOD BLOOD RIGHT ARM  Final   Special Requests   Final    BOTTLES DRAWN AEROBIC AND ANAEROBIC Blood Culture results may not be optimal due to an excessive volume of blood received in culture bottles   Culture   Final    NO GROWTH 5 DAYS Performed at [facility], [place] [address] [place], [place] [address]    Report Status 11/15/2021 FINAL  Final  MRSA Next Gen by PCR, Nasal     Status: None   Collection Time: 11/10/21 10:07 AM   Specimen: Nasal Mucosa; Nasal Swab  Result Value Ref Range Status   MRSA by PCR Next Gen NOT DETECTED NOT DETECTED Final    Comment: (NOTE) The GeneXpert MRSA Assay (FDA approved for NASAL specimens only), is one component of a comprehensive MRSA colonization surveillance program. It is not intended to diagnose MRSA infection nor to guide or monitor treatment for MRSA infections. Test performance is not FDA approved in patients less than 71 years old. Performed at [facility], [place] [address] [place], [place] [address]   Blood culture (routine x 2)     Status: None   Collection Time: 11/10/21 10:08 AM   Specimen: BLOOD  Result Value Ref Range Status   Specimen Description BLOOD BLOOD RIGHT ARM  Final   Special Requests   Final    BOTTLES DRAWN AEROBIC AND ANAEROBIC Blood Culture adequate volume   Culture   Final    NO GROWTH 5 DAYS Performed at [facility], [place] [address] [place], [place] [address]    Report Status 11/15/2021 FINAL  Final  Body fluid culture w Gram Stain     Status: None   Collection Time: 11/10/21  2:28 PM   Specimen: Pleural Fluid  Result Value Ref Range Status   Specimen Description FLUID PLEURAL  Final   Special Requests NONE  Final   Gram Stain NO WBC SEEN NO ORGANISMS SEEN   Final   Culture   Final    NO GROWTH 3 DAYS Performed at [facility], [place] [address], [place], [place] [address]    Report Status 11/13/2021 FINAL  Final  Fungus Culture With Stain      Status: None (Preliminary result)   Collection Time: 11/10/21  2:28 PM   Specimen: Pleural Fluid  Result Value Ref Range Status   Fungus Stain Final report  Final    Comment: (NOTE) Performed At: [facility] [place], [place] [id number] Rush Farmer MD ON:[id number]    Fungus (Mycology) Culture PENDING  Incomplete   Fungal Source FLUID  Final    Comment: PLEURAL Performed at [facility], [place] [address] [place], [place] [address]   Acid Fast Smear (AFB)     Status: None   Collection Time: 11/10/21  2:28 PM   Specimen: Pleural  Result Value Ref Range Status   AFB Specimen Processing Concentration  Final   Acid Fast Smear Negative  Final    Comment: (  NOTE) Performed At: [facility] [place], [place] [id number] Rush Farmer MD IH:[id number]    Source (AFB) FLUID  Final    Comment: PLEURAL Performed at [facility], [place] [address] [place], [place] [address]   Fungus Culture Result     Status: None   Collection Time: 11/10/21  2:28 PM  Result Value Ref Range Status   Result 1 Comment  Final    Comment: (NOTE) KOH/Calcofluor preparation:  no fungus observed. Performed At: [facility] [place], [place] [id number] Rush Farmer MD YS:[id number]   Expectorated Sputum Assessment w Gram Stain, Rflx to Resp Cult     Status: None   Collection Time: 11/11/21  2:12 PM   Specimen: Expectorated Sputum  Result Value Ref Range Status   Specimen Description EXPECTORATED SPUTUM  Final   Special Requests Immunocompromised  Final   Sputum evaluation   Final    Sputum specimen not acceptable for testing.  Please recollect.   RESULT CALLED TO, READ BACK BY AND VERIFIED WITH: RN K.SICKLES ON [id number] AT 1854 BY E.PARRISH Performed at [facility], [place] [address], [place], [place] [address]    Report Status 11/11/2021 FINAL  Final  Acid Fast Smear (AFB)     Status: None   Collection Time: 11/11/21   2:12 PM   Specimen: Sputum  Result Value Ref Range Status   AFB Specimen Processing Concentration  Final   Acid Fast Smear Negative  Final    Comment: (NOTE) Performed At: [facility] [address] [place], [place] [id number] Rush Farmer MD [id number]    Source (AFB) SPUTUM  Final    Comment: Performed at [facility], [place] [address], [place], [place] [address]  Acid Fast Smear (AFB)     Status: None   Collection Time: 11/12/21  7:45 AM   Specimen: Sputum  Result Value Ref Range Status   AFB Specimen Processing Concentration  Final   Acid Fast Smear Negative  Final    Comment: (NOTE) Performed At: [facility] [place], [place] [id number] Rush Farmer MD OJ:[id number]    Source (AFB) SPUTUM  Final    Comment: Performed at [facility], [place] [address] [place], [place] [address]  Acid Fast Smear (AFB)     Status: None   Collection Time: 11/13/21  7:04 PM   Specimen: Sputum  Result Value Ref Range Status   AFB Specimen Processing Concentration  Final   Acid Fast Smear Negative  Final    Comment: (NOTE) Performed At: [facility] [place], [place] [id number] Rush Farmer MD FB:[id number]    Source (AFB) EXPECTORATED SPUTUM  Final    Comment: Performed at [facility], [place] [address] [place], [place] [address]  Aerobic/Anaerobic Culture w Gram Stain (surgical/deep wound)     Status: None   Collection Time: 11/15/21 12:41 PM   Specimen: Tissue  Result Value Ref Range Status   Specimen Description TISSUE  Final   Special Requests LIVER BIOPSY  Final   Gram Stain NO WBC SEEN NO ORGANISMS SEEN   Final   Culture   Final    No growth aerobically or anaerobically. Performed at [facility], [place] [address] [place], [place] [address]    Report Status 11/20/2021 FINAL  Final  Fungus Culture With Stain     Status: None   Collection Time: 11/15/21 12:42 PM   Specimen:  Liver Biopsy  Result  Value Ref Range Status   Fungus Stain Final report  Final    Comment: (NOTE) Performed At: [facility] [place], [place] [id number] Rush Farmer MD WN:[id number]    Fungus (Mycology) Culture PENDING  Incomplete   Fungal Source TISSUE  Corrected    Comment: Performed at [facility], [place] [address], [place], [place] [address] CORRECTED ON 05/10 AT 1325: PREVIOUSLY REPORTED AS BRONCHIAL ALVEOLAR LAVAGE   Acid Fast Smear (AFB)     Status: None   Collection Time: 11/15/21 12:42 PM   Specimen: Liver Biopsy; Tissue  Result Value Ref Range Status   AFB Specimen Processing Comment  Final    Comment: Tissue Grinding and Digestion/Decontamination   Acid Fast Smear Negative  Final    Comment: (NOTE) Performed At: [facility] [place], [place] [id number] Rush Farmer MD [id number]    Source (AFB) TISSUE  Corrected    Comment: Performed at [facility], [place] [address] [place], [place] [address] CORRECTED ON 05/10 AT 1325: PREVIOUSLY REPORTED AS BRONCHIAL ALVEOLAR LAVAGE   Fungus Culture Result     Status: None   Collection Time: 11/15/21 12:42 PM  Result Value Ref Range Status   Result 1 Comment  Final    Comment: (NOTE) KOH/Calcofluor preparation:  no fungus observed. Performed At: [facility] [place], [place] [id number] Rush Farmer MD [id number]   Culture, blood (Routine X 2) w Reflex to ID Panel     Status: None   Collection Time: 11/18/21  7:26 AM   Specimen: BLOOD  Result Value Ref Range Status   Specimen Description BLOOD RIGHT ANTECUBITAL  Final   Special Requests   Final    BOTTLES DRAWN AEROBIC AND ANAEROBIC Blood Culture adequate volume   Culture   Final    NO GROWTH 5 DAYS Performed at [facility], [address] [place], [place] [address]    Report Status 11/23/2021 FINAL  Final  Culture, blood (Routine X 2) w Reflex to ID Panel     Status: None   Collection Time:  11/18/21  7:26 AM   Specimen: BLOOD  Result Value Ref Range Status   Specimen Description BLOOD BLOOD RIGHT ARM  Final   Special Requests   Final    BOTTLES DRAWN AEROBIC AND ANAEROBIC Blood Culture adequate volume   Culture   Final    NO GROWTH 5 DAYS Performed at [facility], [place] [address] [place], [place] [address]    Report Status 11/23/2021 FINAL  Final   Labs: CBC: Recent Labs  Lab 11/21/21 0243 11/22/21 0040 11/23/21 0125 11/24/21 0058 11/25/21 0045  WBC 40.0* 34.2* 37.3* 32.9* 39.7*  NEUTROABS 34.0* 31.1* 35.1* 27.7* 34.1*  HGB 9.3* 8.2* 8.6* 7.9* 9.2*  HCT 29.2* 25.4* 26.6* 24.0* 28.6*  MCV 93.0 89.4 89.6 89.2 89.1  PLT 419* 363 415* 341 151   Basic Metabolic Panel: Recent Labs  Lab 11/19/21 0112 11/20/21 0259 11/22/21 0040 11/24/21 0058 11/25/21 0045  NA  --  134* 133* 133* 133*  K 4.2 3.6 3.9 3.8 3.7  CL  --  107 106 107 106  CO2  --  20* 22 21* 20*  GLUCOSE  --  118* 121* 130* 143*  BUN  --  '8 10 11 13  '$ CREATININE  --  0.80 0.78 0.85 0.78  CALCIUM  --  8.8* 9.0 8.9 9.1  MG  --   --   --  1.8 1.9   Liver Function Tests: Recent Labs  Lab 11/22/21 0040  AST 75*  ALT 20  ALKPHOS 185*  BILITOT 1.6*  PROT 5.3*  ALBUMIN 1.5*   CBG: No results for input(s): GLUCAP in the last 168 hours.  Discharge time spent: greater than 30 minutes.  Signed: Berle Mull, MD [facility]

## 2021-11-27 ENCOUNTER — Telehealth: Payer: Self-pay | Admitting: Pulmonary Disease

## 2021-11-27 ENCOUNTER — Telehealth: Payer: Self-pay

## 2021-11-27 ENCOUNTER — Telehealth: Payer: Self-pay | Admitting: *Deleted

## 2021-11-27 ENCOUNTER — Encounter: Payer: Self-pay | Admitting: *Deleted

## 2021-11-27 NOTE — Telephone Encounter (Signed)
Received call from Jorene Minors, RN w/[facility] that patient/family have requested hospice for home care and are requesting Dr. Benay Spice be his attending. Called and spoke w/sister, Ms. [employer]) and she confirms this. If he makes a significant improvement, he may wish to try chemotherapy, but he is too weak for this now. She will call next week if his OV needs to be changed to telephone visit (do not have Mychart). At this time, she would not be able to get him here.

## 2021-11-27 NOTE — Progress Notes (Signed)
Received notification from [facility] that tissue received on 11/25/21.

## 2021-11-27 NOTE — Telephone Encounter (Signed)
Called Micro back and took critical result for patient and informed Dr Valeta Harms per his request in secure Epic chat the results. Nothing further needed. Dr Valeta Harms stated he would reach out to the doctor that is following up with patient.

## 2021-11-27 NOTE — Telephone Encounter (Signed)
Sean Farrell from [employer] called and ask if Dr.Sherrill was going to be the patient attend for hospice. Do the patient have 6 months or less to live? Dr. Benay Spice agree to be the attend and yes the patient have 6 months or less to live. I call and left a message to Towana Badger to let her know the provider will be the attend for hospice.

## 2021-11-28 ENCOUNTER — Telehealth: Payer: Self-pay | Admitting: *Deleted

## 2021-11-28 ENCOUNTER — Other Ambulatory Visit: Payer: Self-pay | Admitting: *Deleted

## 2021-11-28 NOTE — Telephone Encounter (Signed)
Per Dr. Benay Spice: Patient is hospice appropriate and he will be attending. Left this message for hospice nurse.

## 2021-11-29 ENCOUNTER — Encounter: Payer: Self-pay | Admitting: Infectious Diseases

## 2021-11-29 NOTE — Progress Notes (Signed)
Informed by Dr Berle Mull sputum cx AFB on 5/8 at 2 weeks. MTB PCR is pending per discussion with Microbiology on 5/23. Per discussion with Dr Posey Pronto, patient was discharged on home w hospice. I spoke with Tammy faucette at [facility] regarding the positive sputum for AFB as patient has prior h/o TB for follow up. They will follow up on the patient including final sputum cx as well as MTB PCR.

## 2021-12-05 ENCOUNTER — Encounter (HOSPITAL_COMMUNITY): Payer: Self-pay | Admitting: Oncology

## 2021-12-08 ENCOUNTER — Ambulatory Visit: Payer: Medicare Other | Admitting: Oncology

## 2021-12-08 ENCOUNTER — Encounter (HOSPITAL_COMMUNITY): Payer: Self-pay

## 2021-12-08 ENCOUNTER — Inpatient Hospital Stay: Payer: Medicare Other | Admitting: Oncology

## 2021-12-08 ENCOUNTER — Encounter: Payer: Self-pay | Admitting: *Deleted

## 2021-12-08 NOTE — Progress Notes (Signed)
[facility] called 6/1 11:47 pm to inform office that patient had died. MD notified.

## 2021-12-13 LAB — MAC SUSCEPTIBILITY BROTH
Ciprofloxacin: 8
Clarithromycin: 2
Doxycycline: >8
Linezolid: 32
Minocycline: >8
Rifabutin: 0.25
Rifampin: 4
Streptomycin: 32

## 2021-12-13 LAB — AFB ORGANISM ID BY DNA PROBE
M avium complex: POSITIVE — AB
M tuberculosis complex: NEGATIVE

## 2021-12-13 LAB — FUNGUS CULTURE WITH STAIN

## 2021-12-13 LAB — FUNGUS CULTURE RESULT

## 2021-12-13 LAB — ACID FAST CULTURE WITH REFLEXED SENSITIVITIES (MYCOBACTERIA): Acid Fast Culture: POSITIVE — AB

## 2021-12-13 LAB — FUNGAL ORGANISM REFLEX

## 2021-12-15 LAB — FUNGAL ORGANISM REFLEX

## 2021-12-15 LAB — FUNGUS CULTURE WITH STAIN

## 2021-12-15 LAB — FUNGUS CULTURE RESULT

## 2021-12-19 ENCOUNTER — Inpatient Hospital Stay: Payer: Medicare Other | Admitting: Oncology

## 2021-12-27 LAB — ACID FAST CULTURE WITH REFLEXED SENSITIVITIES (MYCOBACTERIA): Acid Fast Culture: NEGATIVE

## 2021-12-29 LAB — ACID FAST CULTURE WITH REFLEXED SENSITIVITIES (MYCOBACTERIA): Acid Fast Culture: NEGATIVE

## 2021-12-30 LAB — ACID FAST CULTURE WITH REFLEXED SENSITIVITIES (MYCOBACTERIA): Acid Fast Culture: NEGATIVE

## 2022-01-03 ENCOUNTER — Inpatient Hospital Stay: Payer: Medicare Other | Admitting: Infectious Diseases

## 2022-01-06 DEATH — deceased

## 2022-05-02 LAB — ACID FAST CULTURE WITH REFLEXED SENSITIVITIES (MYCOBACTERIA): Acid Fast Culture: NEGATIVE

## 2023-02-10 IMAGING — CR DG CHEST 2V
2 series · 2 of 2 positions shown · non-contrast
Comparison: 11/15/2021

CLINICAL DATA: Loculated pleural effusion, fever, shortness of
breath

EXAM:
CHEST - 2 VIEW

[chest lat]
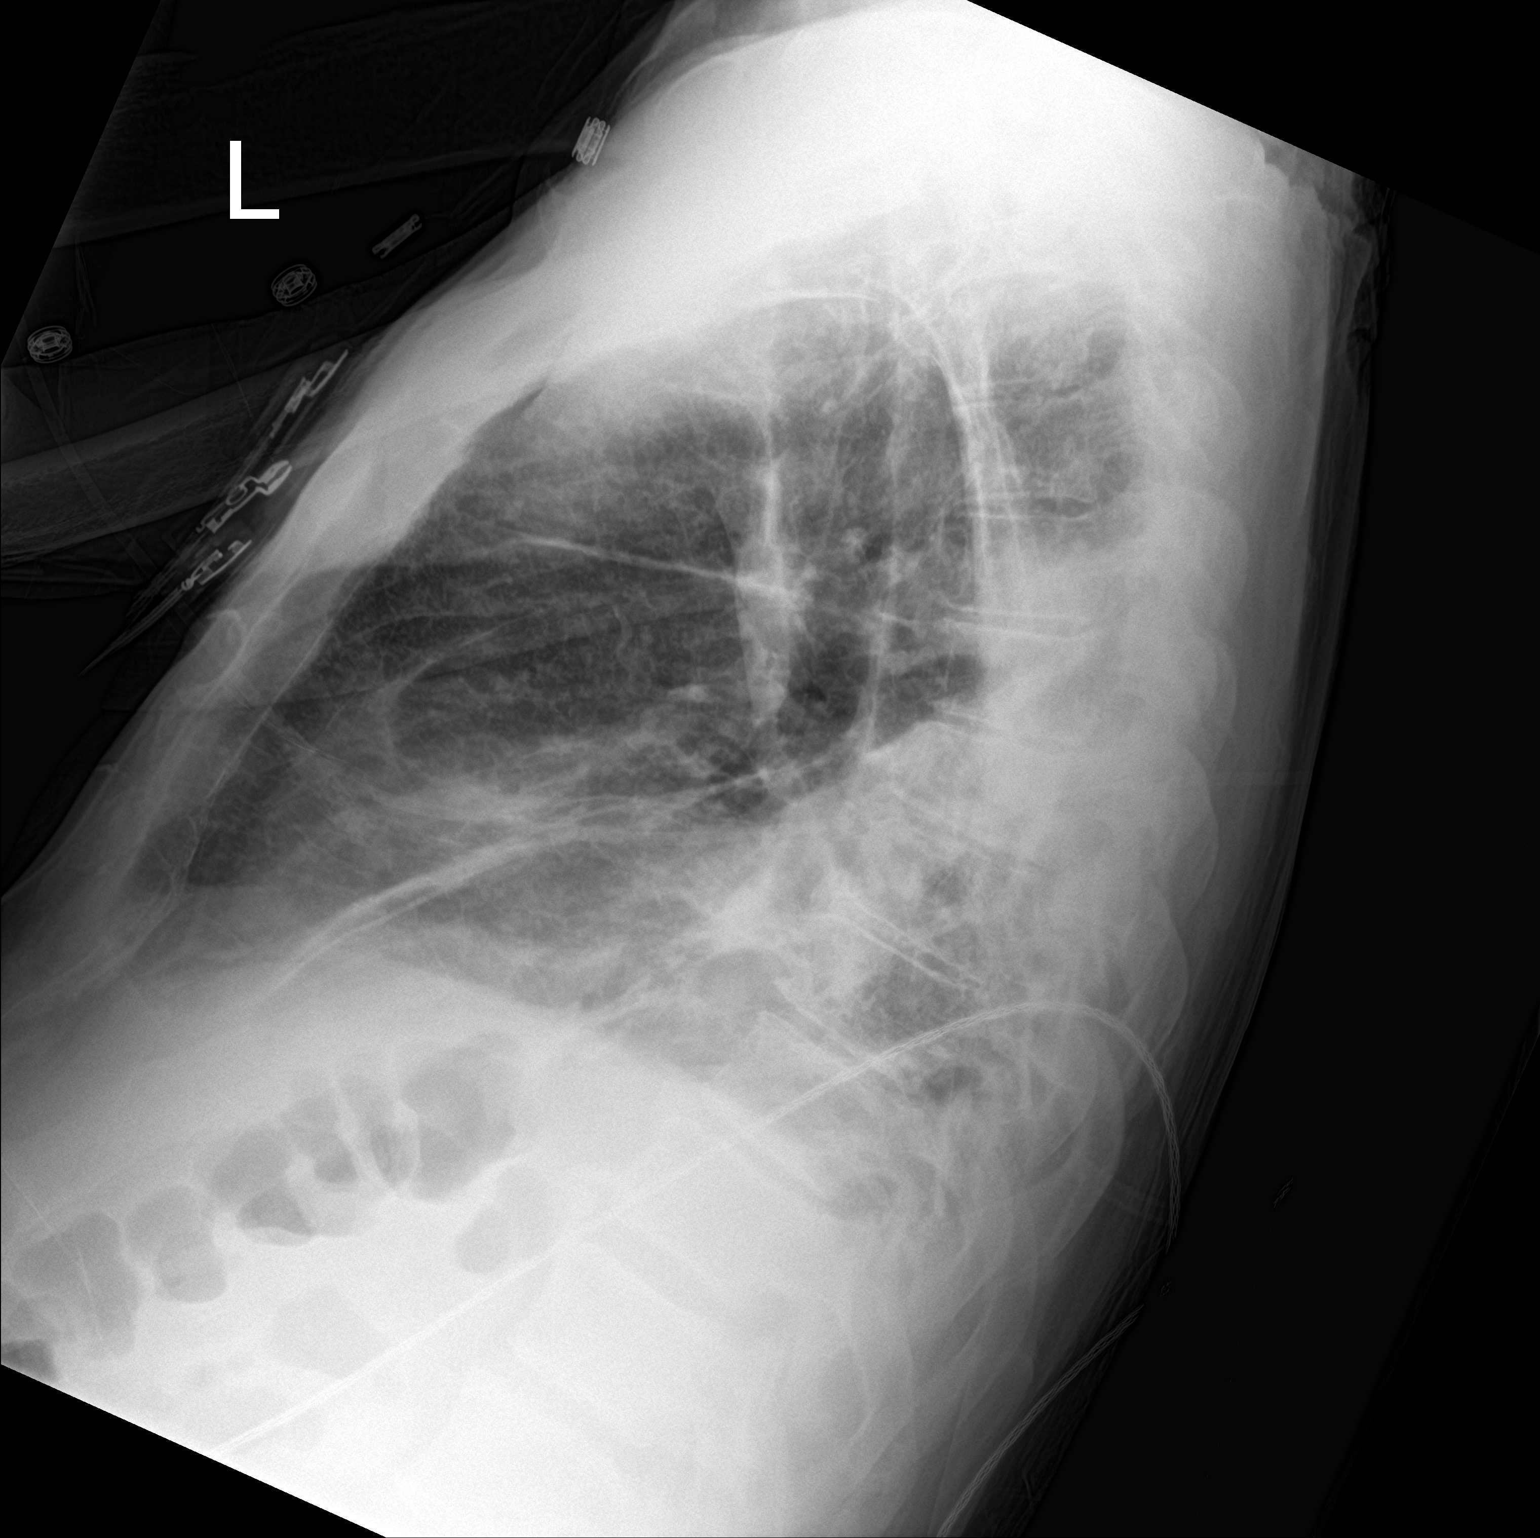

[chest ap]
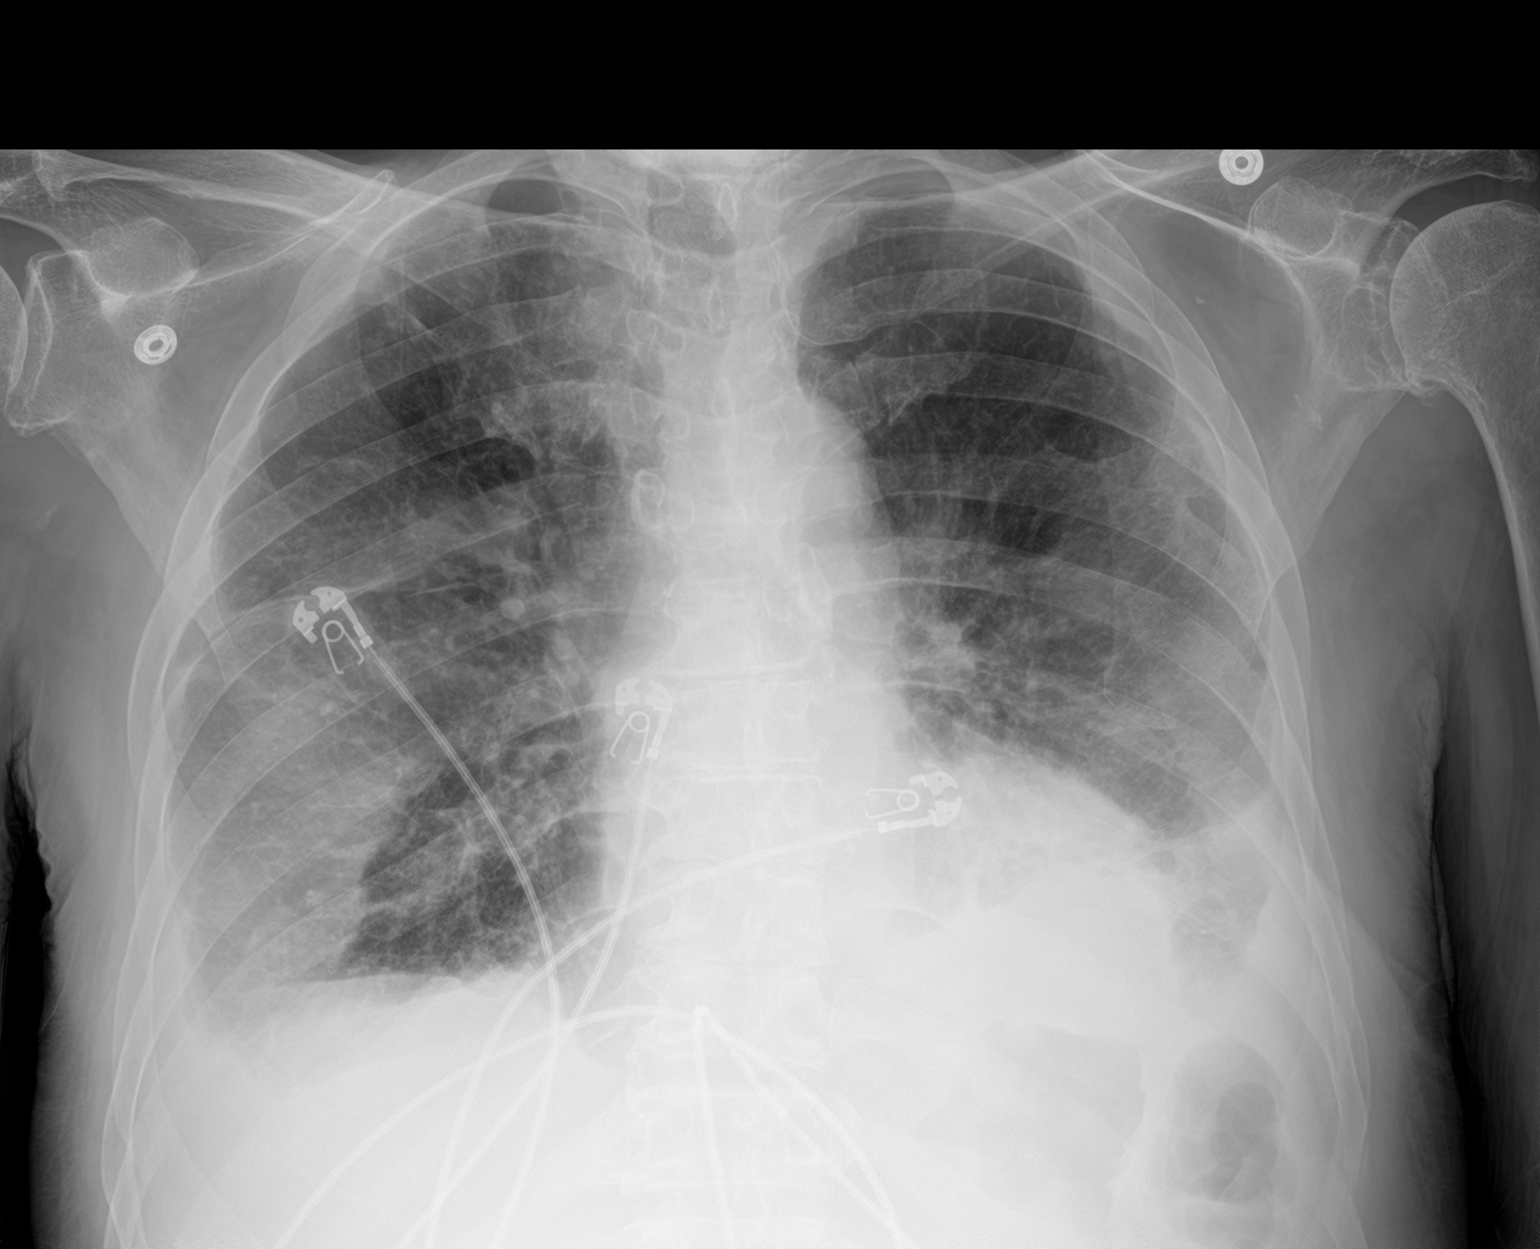

[2 of 2 positions shown; findings below may reference images not displayed]

FINDINGS: Interval removal of a previously seen pigtail chest tube about the
left lung base. Otherwise unchanged AP portable examination with
diffuse bilateral interstitial pulmonary opacity and loculated
bilateral pleural effusions. No new airspace opacity. Heart and
mediastinum are unremarkable.
IMPRESSION: 1. Interval removal of a previously seen pigtail chest tube about
the left lung base. No significant pneumothorax.

2. Otherwise unchanged AP portable examination with diffuse
bilateral interstitial pulmonary opacity and loculated bilateral
pleural effusions. No new airspace opacity.
